# Patient Record
Sex: Female | Born: 1960
Health system: Southern US, Community
[De-identification: ages and names within clinical notes are randomized; demographics above are authoritative.]

## PROBLEM LIST (undated history)

## (undated) DIAGNOSIS — F419 Anxiety disorder, unspecified: Secondary | ICD-10-CM

## (undated) DIAGNOSIS — T7840XA Allergy, unspecified, initial encounter: Secondary | ICD-10-CM

## (undated) DIAGNOSIS — K2940 Chronic atrophic gastritis without bleeding: Secondary | ICD-10-CM

## (undated) DIAGNOSIS — E039 Hypothyroidism, unspecified: Secondary | ICD-10-CM

## (undated) DIAGNOSIS — K317 Polyp of stomach and duodenum: Secondary | ICD-10-CM

## (undated) DIAGNOSIS — E559 Vitamin D deficiency, unspecified: Secondary | ICD-10-CM

## (undated) DIAGNOSIS — E538 Deficiency of other specified B group vitamins: Secondary | ICD-10-CM

## (undated) DIAGNOSIS — D3A8 Other benign neuroendocrine tumors: Secondary | ICD-10-CM

## (undated) DIAGNOSIS — R7302 Impaired glucose tolerance (oral): Secondary | ICD-10-CM

## (undated) DIAGNOSIS — C801 Malignant (primary) neoplasm, unspecified: Secondary | ICD-10-CM

## (undated) DIAGNOSIS — I82409 Acute embolism and thrombosis of unspecified deep veins of unspecified lower extremity: Secondary | ICD-10-CM

## (undated) DIAGNOSIS — Z9889 Other specified postprocedural states: Secondary | ICD-10-CM

## (undated) DIAGNOSIS — R112 Nausea with vomiting, unspecified: Secondary | ICD-10-CM

## (undated) DIAGNOSIS — K2270 Barrett's esophagus without dysplasia: Secondary | ICD-10-CM

## (undated) DIAGNOSIS — J45909 Unspecified asthma, uncomplicated: Secondary | ICD-10-CM

## (undated) DIAGNOSIS — E785 Hyperlipidemia, unspecified: Secondary | ICD-10-CM

## (undated) HISTORY — PX: ESOPHAGOGASTRODUODENOSCOPY: SHX1529

## (undated) HISTORY — PX: DIAGNOSTIC LAPAROSCOPY: SUR761

## (undated) HISTORY — DX: Hypothyroidism, unspecified: E03.9

## (undated) HISTORY — PX: APPENDECTOMY: SHX54

## (undated) HISTORY — DX: Malignant (primary) neoplasm, unspecified: C80.1

## (undated) HISTORY — DX: Deficiency of other specified B group vitamins: E53.8

## (undated) HISTORY — PX: ABDOMINAL HYSTERECTOMY: SHX81

## (undated) HISTORY — DX: Anxiety disorder, unspecified: F41.9

## (undated) HISTORY — PX: TONSILLECTOMY: SUR1361

## (undated) HISTORY — PX: VEIN LIGATION AND STRIPPING: SHX2653

## (undated) HISTORY — DX: Other benign neuroendocrine tumors: D3A.8

## (undated) HISTORY — DX: Allergy, unspecified, initial encounter: T78.40XA

---

## 2010-11-27 HISTORY — PX: TOTAL ABDOMINAL HYSTERECTOMY W/ BILATERAL SALPINGOOPHORECTOMY: SHX83

## 2012-03-26 DIAGNOSIS — Z85020 Personal history of malignant carcinoid tumor of stomach: Secondary | ICD-10-CM | POA: Insufficient documentation

## 2013-05-16 ENCOUNTER — Ambulatory Visit: Payer: Self-pay | Admitting: Unknown Physician Specialty

## 2015-03-24 ENCOUNTER — Ambulatory Visit
Admit: 2015-03-24 | Disposition: A | Discharge status: Home / Self Care | Payer: Self-pay | Attending: Family Medicine | Admitting: Family Medicine

## 2015-05-05 DIAGNOSIS — E039 Hypothyroidism, unspecified: Secondary | ICD-10-CM | POA: Insufficient documentation

## 2015-05-05 DIAGNOSIS — D3A00 Benign carcinoid tumor of unspecified site: Secondary | ICD-10-CM | POA: Insufficient documentation

## 2015-05-05 DIAGNOSIS — E785 Hyperlipidemia, unspecified: Secondary | ICD-10-CM | POA: Insufficient documentation

## 2015-06-28 ENCOUNTER — Telehealth: Payer: Self-pay | Admitting: Family Medicine

## 2015-06-28 NOTE — Telephone Encounter (Signed)
Contacted this patient to relay the information listed below. Patient stated she wanted those items checked again because she has been having lots of pressure in her throat. Patient was transferred up front for an appt. Patient stated that she had her PAP & mammo performed at [place].

## 2015-06-28 NOTE — Telephone Encounter (Signed)
Requesting a lab order to check thyroid, b-12, and cholesterol. [phone]

## 2015-06-28 NOTE — Telephone Encounter (Signed)
Please explain to patient that she has been tested for these lab work once or twice this year already and is not due for lab work unless there are changes to her symptoms in which case she needs to follow up in clinic. Also she has never seen me for a physical who is doing her female PAP tests and mammogram orders?

## 2015-07-09 ENCOUNTER — Encounter: Payer: Self-pay | Admitting: Family Medicine

## 2015-07-09 ENCOUNTER — Ambulatory Visit (INDEPENDENT_AMBULATORY_CARE_PROVIDER_SITE_OTHER): Payer: 59 | Admitting: Family Medicine

## 2015-07-09 VITALS — BP 114/68 | HR 74 | Temp 98.3°F | Resp 16 | Ht 65.0 in | Wt 170.3 lb

## 2015-07-09 DIAGNOSIS — E039 Hypothyroidism, unspecified: Secondary | ICD-10-CM

## 2015-07-09 DIAGNOSIS — J309 Allergic rhinitis, unspecified: Secondary | ICD-10-CM

## 2015-07-09 DIAGNOSIS — R0982 Postnasal drip: Secondary | ICD-10-CM

## 2015-07-09 DIAGNOSIS — L750 Bromhidrosis: Secondary | ICD-10-CM

## 2015-07-09 DIAGNOSIS — F411 Generalized anxiety disorder: Secondary | ICD-10-CM | POA: Insufficient documentation

## 2015-07-09 DIAGNOSIS — L748 Other eccrine sweat disorders: Secondary | ICD-10-CM | POA: Diagnosis not present

## 2015-07-09 DIAGNOSIS — J45909 Unspecified asthma, uncomplicated: Secondary | ICD-10-CM

## 2015-07-09 NOTE — Progress Notes (Signed)
Name: Dana Hensley   MRN: [id number]    DOB: December 28, 1960   Date:07/09/2015       Progress Note  Subjective  Chief Complaint  Chief Complaint  Patient presents with  . Thyroid Problem    pt would like her TSH levels rechecked due to hair loss and neck discomfort.    HPI  Hypothyroidism: Patient presents for evaluation of thyroid function. Symptoms consist of fatigue, feeling cold and cold intolerance, anxiousness, change in skin,  nails, or hair, pressure on her neck when laying down. Symptoms have present for several months. The symptoms are mild.  The problem has been stable.  Previous thyroid studies include TSH, free thyroxine index, thyroid autoantibodies and free thyroxine. The hypothyroidism is due to hypothyroidism and Hashimoto's disease.  We did increase her levothyroxine dose some months ago and she felt her symptoms improve but now she has new symptoms. She notes the heaviness in her neck does not cause her to cough or choke on food or drink. She has no sore throat. Allergies are well controled recently with no nasal congestion or post nasal drip. Also she mentions she is embarrassed about body odor from her arm pits despite trying all types of OTC anti-per spirants and deodorants.     Active Ambulatory Problems    Diagnosis Date Noted  . History of malignant carcinoid tumor of stomach 03/26/2012  . Dyslipidemia 05/05/2015  . Acquired hypothyroidism 05/05/2015  . Body odor 07/09/2015  . GAD (generalized anxiety disorder) 07/09/2015  . Allergic rhinitis with postnasal drip 07/09/2015   Resolved Ambulatory Problems    Diagnosis Date Noted  . No Resolved Ambulatory Problems   Past Medical History  Diagnosis Date  . Anxiety   . Hypothyroidism   . B12 deficiency      Social History  Substance Use Topics  . Smoking status: Never Smoker   . Smokeless tobacco: Not on file  . Alcohol Use: No     Current outpatient prescriptions:  .  betamethasone dipropionate  (DIPROLENE) 0.05 % ointment, , Disp: , Rfl:  .  clobetasol ointment (TEMOVATE) 0.05 %, , Disp: , Rfl:  .  cyanocobalamin (,VITAMIN B-12,) 1000 MCG/ML injection, Inject into the muscle., Disp: , Rfl:  .  ELIDEL 1 % cream, , Disp: , Rfl:  .  hydrocortisone 2.5 % cream, , Disp: , Rfl:  .  levothyroxine (SYNTHROID, LEVOTHROID) 75 MCG tablet, Take 75 mcg by mouth daily before breakfast., Disp: , Rfl:  .  Multiple Vitamin (MULTI-VITAMINS) TABS, Take by mouth., Disp: , Rfl:  .  QNASL 80 MCG/ACT AERS, , Disp: , Rfl:  .  vitamin B-12 (CYANOCOBALAMIN) 1000 MCG tablet, Take by mouth., Disp: , Rfl:   Past Surgical History  Procedure Laterality Date  . Tonsillectomy    . Appendectomy    . Abdominal hysterectomy      Family History  Problem Relation Age of Onset  . Clotting disorder Mother     Allergies  Allergen Reactions  . Latex Swelling    SWELLING AROUND FACE SWELLING AROUND FACE  . Neomycin Other (See Comments) and Rash    Seen from allergy skin test Seen from allergy skin test  . Neosporin  [Neomycin-Bacitracin Zn-Polymyx] Other (See Comments)    Seen from allergy skin test  . Neomycin-Polymyxin-Gramicidin     Other reaction(s): Unknown Seen from allergy skin test  . Other Other (See Comments)    THIMERSOL. THIMERSOL- seen from allergy skin test Other reaction(s): Unknown THIMERSOL. THIMERSOL-  seen from allergy skin test     Review of Systems  CONSTITUTIONAL: No significant weight changes, fever, chills, weakness or fatigue.  HEENT:  - Eyes: No visual changes.  - Ears: No auditory changes. No pain.  - Nose: No sneezing, congestion, runny nose. - Throat: No sore throat. No changes in swallowing. SKIN: No rash or itching.  CARDIOVASCULAR: No chest pain, chest pressure or chest discomfort. No palpitations or edema.  RESPIRATORY: No shortness of breath, cough or sputum.  GASTROINTESTINAL: No anorexia, nausea, vomiting. No changes in bowel habits. No abdominal pain or blood.   GENITOURINARY: No dysuria. No frequency. No discharge.  NEUROLOGICAL: No headache, dizziness, syncope, paralysis, ataxia, numbness or tingling in the extremities. No memory changes. No change in bowel or bladder control.  MUSCULOSKELETAL: No joint pain. No muscle pain. HEMATOLOGIC: No anemia, bleeding or bruising.  LYMPHATICS: No enlarged lymph nodes.  PSYCHIATRIC: No change in mood. No change in sleep pattern.  ENDOCRINOLOGIC: Yes reports of sweating, cold intolerance. No polyuria or polydipsia.     Objective  BP 114/68 mmHg  Pulse 74  Temp(Src) 98.3 F (36.8 C)  Resp 16  Ht 5\' 5"  (1.651 m)  Wt 170 lb 5 oz (77.253 kg)  BMI 28.34 kg/m2  SpO2 97% Body mass index is 28.34 kg/(m^2).  Physical Exam  Constitutional: Patient appears well-developed and well-nourished. In no distress.  HEENT:  - Head: Normocephalic and atraumatic.  - Ears: Bilateral TMs gray, no erythema or effusion - Nose: Nasal mucosa moist - Mouth/Throat: Oropharynx is clear and moist. No tonsillar hypertrophy or erythema. No post nasal drainage.  - Eyes: Conjunctivae clear, EOM movements normal. PERRLA. No scleral icterus.  Neck: Normal range of motion. Neck supple. No JVD present. No thyromegaly present.  Cardiovascular: Normal rate, regular rhythm and normal heart sounds.  No murmur heard.  Pulmonary/Chest: Effort normal and breath sounds normal. No respiratory distress. Musculoskeletal: Normal range of motion bilateral UE and LE, no joint effusions. Peripheral vascular: Bilateral LE no edema. Neurological: CN II-XII grossly intact with no focal deficits. Alert and oriented to person, place, and time. Coordination, balance, strength, speech and gait are normal.  Skin: Skin is warm and dry. No rash noted. No erythema.  Psychiatric: Patient has a normal mood and affect. Behavior is normal in office today. Judgment and thought content normal in office today.   Assessment & Plan  1. Acquired hypothyroidism No  goiter or thyroid masses found on exam. Will recheck lab work and adjust thyroid medication if needed.  - TSH - T3, free - T4, free  2. Body odor Not sure if there is anything prescription strength that could help with odor.  3. Allergic rhinitis with postnasal drip Sensation in throat may be due to allergies and post nasal drainage but she will follow up with ENT if symptoms persist or worsen.

## 2015-07-10 LAB — T4, FREE: Free T4: 1.32 ng/dL (ref 0.82–1.77)

## 2015-07-10 LAB — T3, FREE: T3, Free: 2.9 pg/mL (ref 2.0–4.4)

## 2015-07-10 LAB — TSH: TSH: 1.06 u[IU]/mL (ref 0.450–4.500)

## 2015-07-12 ENCOUNTER — Telehealth: Payer: Self-pay | Admitting: Family Medicine

## 2015-07-12 NOTE — Telephone Encounter (Signed)
Patient returning your call.

## 2015-07-12 NOTE — Telephone Encounter (Signed)
Contacted this patient to inform her of the results from her Thyroid testing. Patient was also informed that the concerns she had regarding body odor could be diet related and that she could search the Internet to see what she can find.

## 2015-08-19 ENCOUNTER — Other Ambulatory Visit: Payer: Self-pay | Admitting: Family Medicine

## 2015-08-19 ENCOUNTER — Telehealth: Payer: Self-pay | Admitting: Family Medicine

## 2015-08-19 DIAGNOSIS — M549 Dorsalgia, unspecified: Principal | ICD-10-CM

## 2015-08-19 DIAGNOSIS — G8929 Other chronic pain: Principal | ICD-10-CM

## 2015-08-19 DIAGNOSIS — M542 Cervicalgia: Secondary | ICD-10-CM | POA: Insufficient documentation

## 2015-08-19 MED ORDER — CYCLOBENZAPRINE HCL 5 MG PO TABS
5.0000 mg | ORAL_TABLET | Freq: Two times a day (BID) | ORAL | Status: DC | PRN
Start: 2015-08-19 — End: 2016-10-11

## 2015-08-19 NOTE — Telephone Encounter (Signed)
Both done 

## 2015-08-19 NOTE — Telephone Encounter (Signed)
Patient is requesting a referral to be sent to Dr. Milagros Reap (he is a Restaurant manager, [employer]). Please call them at [phone]. Due to ongoing neck and back pain. She has seen him in the past. Also Requesting a refill cyclobenzaprine 5mg . Please send to [email or web address]. Completely out, last seen in August.

## 2015-08-19 NOTE — Telephone Encounter (Signed)
Refill request was sent to Dr. Ashany Sundaram for approval and submission.  

## 2015-09-06 ENCOUNTER — Other Ambulatory Visit: Payer: Self-pay | Admitting: Family Medicine

## 2015-10-18 ENCOUNTER — Encounter: Payer: Self-pay | Admitting: Family Medicine

## 2015-10-18 ENCOUNTER — Ambulatory Visit (INDEPENDENT_AMBULATORY_CARE_PROVIDER_SITE_OTHER): Payer: 59 | Admitting: Family Medicine

## 2015-10-18 VITALS — BP 122/82 | HR 74 | Temp 98.5°F | Resp 16 | Wt 164.8 lb

## 2015-10-18 DIAGNOSIS — E785 Hyperlipidemia, unspecified: Secondary | ICD-10-CM | POA: Diagnosis not present

## 2015-10-18 DIAGNOSIS — J309 Allergic rhinitis, unspecified: Secondary | ICD-10-CM

## 2015-10-18 DIAGNOSIS — E538 Deficiency of other specified B group vitamins: Secondary | ICD-10-CM | POA: Diagnosis not present

## 2015-10-18 DIAGNOSIS — E039 Hypothyroidism, unspecified: Secondary | ICD-10-CM | POA: Diagnosis not present

## 2015-10-18 DIAGNOSIS — R0982 Postnasal drip: Secondary | ICD-10-CM | POA: Diagnosis not present

## 2015-10-18 DIAGNOSIS — J45909 Unspecified asthma, uncomplicated: Secondary | ICD-10-CM | POA: Diagnosis not present

## 2015-10-18 DIAGNOSIS — J029 Acute pharyngitis, unspecified: Secondary | ICD-10-CM | POA: Insufficient documentation

## 2015-10-18 DIAGNOSIS — J069 Acute upper respiratory infection, unspecified: Secondary | ICD-10-CM | POA: Diagnosis not present

## 2015-10-18 NOTE — Patient Instructions (Signed)
Switch from Claritin or Zyrtec to Allegra 180 mg one a day.

## 2015-10-18 NOTE — Addendum Note (Signed)
Addended by: Bobetta Lime on: 10/18/2015 10:02 AM   Modules accepted: Orders, Level of Service

## 2015-10-18 NOTE — Progress Notes (Addendum)
Name: Dana Hensley   MRN: [id number]    DOB: 11/20/1961   Date:10/18/2015       Progress Note  Subjective  Chief Complaint  Chief Complaint  Patient presents with  . Allergies    patient presents with throat/ neck pain, with runny nose and sneezing. otc loratadine.    HPI  Patient is here today with concerns regarding the following symptoms sore throat, congestion, post nasal drip, sneezing, ear pressure, sinus pressure, headaches and achiness that started between May and July, but the recent flare up has been a couple of days ago.  Associated with chills and fatigue. Has tried the following home remedies: Loratadine and NyQuil. Has seen ENT once earlier this year and it was suggested that she may benefit from allergy shots however she was unable to follow up due to insurance and referral issues. She is currently on antibiotics for UTI from her gynecologist, Macrobid.  Requesting blood work for her chronic medical conditions such as hypothyroidism, b12 deficiency, hld. Taking medications with no problems reported.  Past Medical History  Diagnosis Date  . Anxiety   . Hypothyroidism   . B12 deficiency   . Allergy     Social History  Substance Use Topics  . Smoking status: Never Smoker   . Smokeless tobacco: Not on file  . Alcohol Use: No     Current outpatient prescriptions:  .  betamethasone dipropionate (DIPROLENE) 0.05 % ointment, , Disp: , Rfl:  .  clobetasol ointment (TEMOVATE) 0.05 %, , Disp: , Rfl:  .  cyanocobalamin (,VITAMIN B-12,) 1000 MCG/ML injection, Inject into the muscle., Disp: , Rfl:  .  cyclobenzaprine (FLEXERIL) 5 MG tablet, Take 1 tablet (5 mg total) by mouth 2 (two) times daily as needed for muscle spasms., Disp: 40 tablet, Rfl: 3 .  ELIDEL 1 % cream, , Disp: , Rfl:  .  hydrocortisone 2.5 % cream, , Disp: , Rfl:  .  levothyroxine (SYNTHROID, LEVOTHROID) 75 MCG tablet, TAKE ONE TABLET BY MOUTH ONCE DAILY, Disp: 90 tablet, Rfl: 1 .  Multiple Vitamin  (MULTI-VITAMINS) TABS, Take by mouth., Disp: , Rfl:  .  QNASL 80 MCG/ACT AERS, , Disp: , Rfl:  .  vitamin B-12 (CYANOCOBALAMIN) 1000 MCG tablet, Take by mouth., Disp: , Rfl:   Allergies  Allergen Reactions  . Latex Swelling    SWELLING AROUND FACE SWELLING AROUND FACE  . Neomycin Other (See Comments) and Rash    Seen from allergy skin test Seen from allergy skin test  . Neosporin  [Neomycin-Bacitracin Zn-Polymyx] Other (See Comments)    Seen from allergy skin test  . Neomycin-Polymyxin-Gramicidin     Other reaction(s): Unknown Seen from allergy skin test  . Other Other (See Comments)    THIMERSOL. THIMERSOL- seen from allergy skin test Other reaction(s): Unknown THIMERSOL. THIMERSOL- seen from allergy skin test    ROS  CONSTITUTIONAL: No significant weight changes, fever, chills, weakness. Yes fatigue.  HEENT:  - Eyes: No visual changes.  - Ears: No auditory changes. Yes fullness.  - Nose: Yes sneezing, congestion, runny nose. - Throat: Yes sore throat. No changes in swallowing. SKIN: No rash or itching.  CARDIOVASCULAR: No chest pain, chest pressure or chest discomfort. No palpitations or edema.  RESPIRATORY: No shortness of breath, cough or sputum.  LYMPHATICS: No enlarged lymph nodes.  PSYCHIATRIC: No change in mood. No change in sleep pattern.  ENDOCRINOLOGIC: No reports of sweating, cold or heat intolerance. No polyuria or polydipsia.  Objective  Filed Vitals:   10/18/15 0929  BP: 122/82  Pulse: 74  Temp: 98.5 F (36.9 C)  TempSrc: Oral  Resp: 16  Weight: 164 lb 12.8 oz (74.753 kg)  SpO2: 96%   Body mass index is 27.42 kg/(m^2).   Physical Exam  Constitutional: Patient appears well-developed and well-nourished. In no acute distress. HEENT:  - Head: Normocephalic and atraumatic.  - Ears: RIGHT and LEFT TM pearly gray with no effusion or retractions or erythema. - Nose: Nasal mucosa boggy and congested.  - Mouth/Throat: Oropharynx is moist with no  erythema of bilateral tonsils without hypertrophy or exudates. Post nasal drainage present.  - Eyes: Conjunctivae clear, EOM movements normal. PERRLA. No scleral icterus.  Neck: Normal range of motion. Neck supple. No JVD present. No thyromegaly present. No local lymphadenopathy. Cardiovascular: Regular rate, regular rhythm with no murmurs heard.  Pulmonary/Chest: Effort normal and breath sounds clear in all lung fields.  Musculoskeletal: Normal range of motion bilateral UE and LE, no joint effusions. Skin: Skin is warm, dry lips.  Psychiatric: Patient has a stable mood and affect. Behavior is normal in office today. Judgment and thought content normal in office today.   Assessment & Plan  1. URI (upper respiratory infection) Etiology likely viral URI with poorly controlled allergies. Instructed patient on increasing hydration, nasal saline spray, steam inhalation, NSAID if tolerated and not contraindicated. Toggle anti-histamine. If symptoms persist/worsen may consider antibiotic therapy. Offered prednisone taper pack patient declined.   2. Allergic rhinitis with postnasal drip Refer patient back to [facility] for continued allergy symptoms with post nasal drainage despite taking antihistamine and flonase. She has seen them once.   - Ambulatory referral to ENT  3. Dyslipidemia  - Lipid panel  4. Acquired hypothyroidism  - CBC with Differential/Platelet - Comprehensive metabolic panel - TSH - T3, free - T4, free  5. Vitamin B12 deficiency  - CBC with Differential/Platelet - Comprehensive metabolic panel - [id number]

## 2015-10-27 LAB — COMPREHENSIVE METABOLIC PANEL
A/G RATIO: 1.4 (ref 1.1–2.5)
ALBUMIN: 4 g/dL (ref 3.5–5.5)
ALT: 9 IU/L (ref 0–32)
AST: 12 IU/L (ref 0–40)
Alkaline Phosphatase: 69 IU/L (ref 39–117)
BILIRUBIN TOTAL: 0.3 mg/dL (ref 0.0–1.2)
BUN/Creatinine Ratio: 22 (ref 9–23)
BUN: 14 mg/dL (ref 6–24)
CHLORIDE: 103 mmol/L (ref 97–106)
CO2: 26 mmol/L (ref 18–29)
Calcium: 9.5 mg/dL (ref 8.7–10.2)
Creatinine, Ser: 0.65 mg/dL (ref 0.57–1.00)
GFR calc non Af Amer: 102 mL/min/{1.73_m2} (ref >59)
GFR, EST AFRICAN AMERICAN: 117 mL/min/{1.73_m2} (ref >59)
Globulin, Total: 2.8 g/dL (ref 1.5–4.5)
Glucose: 91 mg/dL (ref 65–99)
POTASSIUM: 4.7 mmol/L (ref 3.5–5.2)
Sodium: 142 mmol/L (ref 136–144)
Total Protein: 6.8 g/dL (ref 6.0–8.5)

## 2015-10-27 LAB — TSH: TSH: 1.8 u[IU]/mL (ref 0.450–4.500)

## 2015-10-27 LAB — CBC WITH DIFFERENTIAL/PLATELET
BASOS: 0 %
Basophils Absolute: 0 10*3/uL (ref 0.0–0.2)
EOS (ABSOLUTE): 0.2 10*3/uL (ref 0.0–0.4)
Eos: 3 %
Hematocrit: 37.1 % (ref 34.0–46.6)
Hemoglobin: 12.3 g/dL (ref 11.1–15.9)
IMMATURE GRANS (ABS): 0 10*3/uL (ref 0.0–0.1)
Immature Granulocytes: 0 %
LYMPHS: 34 %
Lymphocytes Absolute: 2.6 10*3/uL (ref 0.7–3.1)
MCH: 28.7 pg (ref 26.6–33.0)
MCHC: 33.2 g/dL (ref 31.5–35.7)
MCV: 87 fL (ref 79–97)
Monocytes Absolute: 0.4 10*3/uL (ref 0.1–0.9)
Monocytes: 5 %
NEUTROS ABS: 4.4 10*3/uL (ref 1.4–7.0)
Neutrophils: 58 %
PLATELETS: 261 10*3/uL (ref 150–379)
RBC: 4.28 x10E6/uL (ref 3.77–5.28)
RDW: 14.3 % (ref 12.3–15.4)
WBC: 7.6 10*3/uL (ref 3.4–10.8)

## 2015-10-27 LAB — T3, FREE: T3, Free: 2.7 pg/mL (ref 2.0–4.4)

## 2015-10-27 LAB — LIPID PANEL
Chol/HDL Ratio: 5.1 ratio units — ABNORMAL HIGH (ref 0.0–4.4)
Cholesterol, Total: 204 mg/dL — ABNORMAL HIGH (ref 100–199)
HDL: 40 mg/dL (ref >39)
LDL Calculated: 141 mg/dL — ABNORMAL HIGH (ref 0–99)
Triglycerides: 117 mg/dL (ref 0–149)
VLDL Cholesterol Cal: 23 mg/dL (ref 5–40)

## 2015-10-27 LAB — VITAMIN B12: VITAMIN B 12: 642 pg/mL (ref 211–946)

## 2015-10-27 LAB — T4, FREE: FREE T4: 1.32 ng/dL (ref 0.82–1.77)

## 2015-11-26 ENCOUNTER — Ambulatory Visit (INDEPENDENT_AMBULATORY_CARE_PROVIDER_SITE_OTHER): Payer: 59 | Admitting: Family Medicine

## 2015-11-26 ENCOUNTER — Encounter: Payer: Self-pay | Admitting: Family Medicine

## 2015-11-26 VITALS — BP 132/78 | HR 91 | Temp 98.2°F | Resp 16 | Wt 165.0 lb

## 2015-11-26 DIAGNOSIS — R0982 Postnasal drip: Secondary | ICD-10-CM | POA: Diagnosis not present

## 2015-11-26 DIAGNOSIS — J0100 Acute maxillary sinusitis, unspecified: Secondary | ICD-10-CM | POA: Diagnosis not present

## 2015-11-26 DIAGNOSIS — R300 Dysuria: Secondary | ICD-10-CM

## 2015-11-26 DIAGNOSIS — J45909 Unspecified asthma, uncomplicated: Secondary | ICD-10-CM

## 2015-11-26 DIAGNOSIS — J309 Allergic rhinitis, unspecified: Secondary | ICD-10-CM

## 2015-11-26 LAB — POCT URINALYSIS DIPSTICK
BILIRUBIN UA: NEGATIVE
Blood, UA: NEGATIVE
Glucose, UA: NEGATIVE
Ketones, UA: NEGATIVE
Leukocytes, UA: NEGATIVE
Nitrite, UA: NEGATIVE
PH UA: 5
PROTEIN UA: NEGATIVE
Spec Grav, UA: 1.015
Urobilinogen, UA: 0.2

## 2015-11-26 MED ORDER — AMOXICILLIN-POT CLAVULANATE 875-125 MG PO TABS
1.0000 | ORAL_TABLET | Freq: Two times a day (BID) | ORAL | Status: DC
Start: 2015-11-26 — End: 2016-03-15

## 2015-11-26 MED ORDER — PREDNISONE 10 MG (21) PO TBPK: ORAL_TABLET | ORAL | Status: DC

## 2015-11-26 NOTE — Patient Instructions (Signed)

## 2015-11-26 NOTE — Progress Notes (Signed)
Name: Dana Hensley   MRN: [id number]    DOB: June 10, 1961   Date:11/26/2015       Progress Note  Subjective  Chief Complaint  Chief Complaint  Patient presents with  . Sinusitis    lots of sickness in the house. patient stated that she has been sick for 5-6 days.     HPI  Patient is here today with concerns regarding the following symptoms sore throat (at first), congestion, post nasal drip, coryza, sinus pressure, non productive cough, productive cough and achiness that started about a week ago.  Patient stated that she has blowing out thick greenish nasal mucus and is coughing up green chunks. Associated with chills, sweats, fatigue and malaise. Has tried the following home remedies: NyQuil, DayQuil and Ibuprofen. Patient has tries to hydrate and get plenty of rest.  She also mentions that for a few months now has vague intermittent pelvic discomfort and dysuria but not frequency or blood in her urine or fevers or flank pain or vaginal discharge. Saw her gynecologist and pelvic work up negative. Urine did have blood at the time and she was treated for UTI but she never heard back about culture results. She is reporting reoccurrence of symptoms today.  Past Medical History  Diagnosis Date  . Anxiety   . Hypothyroidism   . B12 deficiency   . Allergy     Social History  Substance Use Topics  . Smoking status: Never Smoker   . Smokeless tobacco: Not on file  . Alcohol Use: No     Current outpatient prescriptions:  .  betamethasone dipropionate (DIPROLENE) 0.05 % ointment, , Disp: , Rfl:  .  clobetasol ointment (TEMOVATE) 0.05 %, , Disp: , Rfl:  .  cyclobenzaprine (FLEXERIL) 5 MG tablet, Take 1 tablet (5 mg total) by mouth 2 (two) times daily as needed for muscle spasms., Disp: 40 tablet, Rfl: 3 .  ELIDEL 1 % cream, , Disp: , Rfl:  .  fluticasone (FLONASE) 50 MCG/ACT nasal spray, , Disp: , Rfl:  .  hydrocortisone 2.5 % cream, , Disp: , Rfl:  .  levothyroxine (SYNTHROID,  LEVOTHROID) 75 MCG tablet, TAKE ONE TABLET BY MOUTH ONCE DAILY, Disp: 90 tablet, Rfl: 1 .  Multiple Vitamin (MULTI-VITAMINS) TABS, Take by mouth., Disp: , Rfl:  .  QNASL 80 MCG/ACT AERS, , Disp: , Rfl:  .  vitamin B-12 (CYANOCOBALAMIN) 1000 MCG tablet, Take by mouth., Disp: , Rfl:   Allergies  Allergen Reactions  . Latex Swelling    SWELLING AROUND FACE SWELLING AROUND FACE  . Neomycin Other (See Comments) and Rash    Seen from allergy skin test Seen from allergy skin test  . Neosporin  [Neomycin-Bacitracin Zn-Polymyx] Other (See Comments)    Seen from allergy skin test  . Neomycin-Polymyxin-Gramicidin     Other reaction(s): Unknown Seen from allergy skin test  . Other Other (See Comments)    THIMERSOL. THIMERSOL- seen from allergy skin test Other reaction(s): Unknown THIMERSOL. THIMERSOL- seen from allergy skin test    ROS  CONSTITUTIONAL: No significant weight changes, fever, chills, weakness or fatigue.  HEENT:  - Eyes: No visual changes.  - Ears: No auditory changes. No pain.  - Nose: Yes congestion, sinus pressure. - Throat: Mild sore throat. No changes in swallowing. SKIN: No rash or itching.  CARDIOVASCULAR: No chest pain, chest pressure or chest discomfort. No palpitations or edema.  RESPIRATORY: No shortness of breath. Mild cough. GASTROINTESTINAL: No anorexia, nausea, vomiting. No changes in  bowel habits. No abdominal pain or blood.  GENITOURINARY: Yes dysuria. No frequency. No discharge.  LYMPHATICS: No enlarged lymph nodes.      Objective  Filed Vitals:   11/26/15 1006  BP: 132/78  Pulse: 91  Temp: 98.2 F (36.8 C)  TempSrc: Oral  Resp: 16  Weight: 165 lb (74.844 kg)  SpO2: 98%   Body mass index is 27.46 kg/(m^2).   Physical Exam  Constitutional: Patient appears well-developed and well-nourished. In no acute distress but does appear to be fatigued from acute illness. HEENT:  - Head: Normocephalic and atraumatic.  - Ears: RIGHT TM bulging with  minimal clear exudate, LEFT TM bulging with minimal clear exudate.  - Nose: Nasal mucosa boggy and congested.  - Mouth/Throat: Oropharynx is moist with slight erythema of bilateral tonsils without hypertrophy or exudates. Post nasal drainage present.  - Eyes: Conjunctivae clear, EOM movements normal. PERRLA. No scleral icterus.  Neck: Normal range of motion. Neck supple. No JVD present. No thyromegaly present. No local lymphadenopathy. Cardiovascular: Regular rate, regular rhythm with no murmurs heard.  Pulmonary/Chest: Effort normal and breath sounds clear in all lung fields.  Abdomen: Soft, patient reports tenderness right LQ and suprapubic area. No guarding or rebound. Musculoskeletal: Normal range of motion bilateral UE and LE, no joint effusions. Skin: Skin is warm and dry. No rash noted. Psychiatric: Patient has a normal mood and affect. Behavior is normal in office today. Judgment and thought content normal in office today.  Results for orders placed or performed in visit on 11/26/15 (from the past 24 hour(s))  POCT Urinalysis Dipstick     Status: Normal   Collection Time: 11/26/15 10:48 AM  Result Value Ref Range   Color, UA Yellow    Clarity, UA Clear    Glucose, UA Negative    Bilirubin, UA Negative    Ketones, UA Negative    Spec Grav, UA 1.015    Blood, UA Negative    pH, UA 5.0    Protein, UA Negative    Urobilinogen, UA 0.2    Nitrite, UA Negative    Leukocytes, UA Negative Negative     Assessment & Plan  1. Acute maxillary sinusitis, recurrence not specified Etiologies include initial allergic rhinitis or viral infection progressing to superimposed bacterial infection. Instructed patient on increasing hydration, nasal saline spray, steam inhalation, NSAID if tolerated and not contraindicated. If not already doing so start taking daily anti-histamine and use a steroid nasal spray. If symptoms persist/worsen may consider antibiotic therapy.  - amoxicillin-clavulanate  (AUGMENTIN) 875-125 MG tablet; Take 1 tablet by mouth 2 (two) times daily.  Dispense: 20 tablet; Refill: 0 - predniSONE (STERAPRED UNI-PAK 21 TAB) 10 MG (21) TBPK tablet; Use as directed in a 6 day taper PredPak  Dispense: 21 tablet; Refill: 0  2. Allergic rhinitis with postnasal drip She did F/U with Allergist and is to start allergy shots early 2017.  - amoxicillin-clavulanate (AUGMENTIN) 875-125 MG tablet; Take 1 tablet by mouth 2 (two) times daily.  Dispense: 20 tablet; Refill: 0 - predniSONE (STERAPRED UNI-PAK 21 TAB) 10 MG (21) TBPK tablet; Use as directed in a 6 day taper PredPak  Dispense: 21 tablet; Refill: 0  3. Dysuria Udip unremarkable. Ucx pending. May need to see Urologist.  - POCT Urinalysis Dipstick - Urine Culture

## 2015-11-27 LAB — URINE CULTURE: Organism ID, Bacteria: NO GROWTH

## 2015-11-27 LAB — PLEASE NOTE

## 2016-01-25 ENCOUNTER — Encounter: Payer: Self-pay | Admitting: Family Medicine

## 2016-01-25 DIAGNOSIS — K2270 Barrett's esophagus without dysplasia: Secondary | ICD-10-CM | POA: Insufficient documentation

## 2016-03-14 ENCOUNTER — Other Ambulatory Visit: Payer: Self-pay

## 2016-03-15 MED ORDER — LEVOTHYROXINE SODIUM 75 MCG PO TABS
75.0000 ug | ORAL_TABLET | Freq: Every day | ORAL | Status: DC
Start: 2016-03-15 — End: 2023-08-08

## 2016-03-15 NOTE — Telephone Encounter (Signed)
Last TSH, T3, T4 from Nov 2016 reviewed; normal; Rx approved

## 2016-09-13 ENCOUNTER — Encounter (INDEPENDENT_AMBULATORY_CARE_PROVIDER_SITE_OTHER): Payer: Self-pay

## 2016-09-25 ENCOUNTER — Ambulatory Visit (INDEPENDENT_AMBULATORY_CARE_PROVIDER_SITE_OTHER): Payer: Self-pay | Admitting: Vascular Surgery

## 2016-10-03 ENCOUNTER — Other Ambulatory Visit: Payer: Self-pay | Admitting: Nurse Practitioner

## 2016-10-03 DIAGNOSIS — D3A092 Benign carcinoid tumor of the stomach: Secondary | ICD-10-CM

## 2016-10-03 DIAGNOSIS — R1013 Epigastric pain: Secondary | ICD-10-CM | POA: Insufficient documentation

## 2016-10-03 DIAGNOSIS — R1011 Right upper quadrant pain: Secondary | ICD-10-CM

## 2016-10-09 ENCOUNTER — Ambulatory Visit
Admission: RE | Admit: 2016-10-09 | Discharge: 2016-10-09 | Disposition: A | Discharge status: Home / Self Care | Payer: 59 | Source: Ambulatory Visit | Attending: Nurse Practitioner | Admitting: Nurse Practitioner

## 2016-10-09 ENCOUNTER — Other Ambulatory Visit (INDEPENDENT_AMBULATORY_CARE_PROVIDER_SITE_OTHER): Payer: Self-pay | Admitting: Vascular Surgery

## 2016-10-09 DIAGNOSIS — K8020 Calculus of gallbladder without cholecystitis without obstruction: Secondary | ICD-10-CM | POA: Diagnosis not present

## 2016-10-09 DIAGNOSIS — R1011 Right upper quadrant pain: Secondary | ICD-10-CM | POA: Diagnosis not present

## 2016-10-09 DIAGNOSIS — D3A092 Benign carcinoid tumor of the stomach: Secondary | ICD-10-CM | POA: Insufficient documentation

## 2016-10-09 DIAGNOSIS — M7989 Other specified soft tissue disorders: Secondary | ICD-10-CM

## 2016-10-09 DIAGNOSIS — I83813 Varicose veins of bilateral lower extremities with pain: Secondary | ICD-10-CM

## 2016-10-09 DIAGNOSIS — M79661 Pain in right lower leg: Secondary | ICD-10-CM

## 2016-10-09 DIAGNOSIS — M79662 Pain in left lower leg: Secondary | ICD-10-CM

## 2016-10-11 ENCOUNTER — Ambulatory Visit (INDEPENDENT_AMBULATORY_CARE_PROVIDER_SITE_OTHER): Payer: 59 | Admitting: Vascular Surgery

## 2016-10-11 ENCOUNTER — Encounter (INDEPENDENT_AMBULATORY_CARE_PROVIDER_SITE_OTHER): Payer: Self-pay | Admitting: Vascular Surgery

## 2016-10-11 ENCOUNTER — Ambulatory Visit (INDEPENDENT_AMBULATORY_CARE_PROVIDER_SITE_OTHER): Payer: 59

## 2016-10-11 VITALS — BP 138/89 | HR 57 | Resp 16 | Ht 65.0 in | Wt 160.0 lb

## 2016-10-11 DIAGNOSIS — M79662 Pain in left lower leg: Secondary | ICD-10-CM | POA: Diagnosis not present

## 2016-10-11 DIAGNOSIS — M7989 Other specified soft tissue disorders: Secondary | ICD-10-CM | POA: Diagnosis not present

## 2016-10-11 DIAGNOSIS — M79661 Pain in right lower leg: Secondary | ICD-10-CM | POA: Diagnosis not present

## 2016-10-11 DIAGNOSIS — I83813 Varicose veins of bilateral lower extremities with pain: Secondary | ICD-10-CM

## 2016-10-11 DIAGNOSIS — I872 Venous insufficiency (chronic) (peripheral): Secondary | ICD-10-CM | POA: Diagnosis not present

## 2016-10-11 DIAGNOSIS — I890 Lymphedema, not elsewhere classified: Secondary | ICD-10-CM

## 2016-10-16 DIAGNOSIS — I83813 Varicose veins of bilateral lower extremities with pain: Secondary | ICD-10-CM | POA: Insufficient documentation

## 2016-10-16 DIAGNOSIS — I890 Lymphedema, not elsewhere classified: Secondary | ICD-10-CM | POA: Insufficient documentation

## 2016-10-16 DIAGNOSIS — I872 Venous insufficiency (chronic) (peripheral): Secondary | ICD-10-CM | POA: Insufficient documentation

## 2016-10-16 NOTE — Progress Notes (Signed)
Subjective:    Patient ID: Dana Hensley, female    DOB: 05-18-1961, 55 y.o.   MRN: [id number] Chief Complaint  Patient presents with  . Re-evaluation    Bilateral reflux ultrasound    Patient last seen on 06/12/16 for evaluation of bilateral symptomatic varicose veins. The patient relates burning and stinging which worsened steadily throughout the course of the day, particularly with standing. She states this is "just a bad feeling". The patient also notes an aching and throbbing pain over the varicosities, particularly with prolonged dependent positions. The patient also notes that during hot weather the symptoms are greatly intensified. Since her initial visit the patient has been wearing medical grade one compression stockings, elevating her legs and remaining active with minimal relief requiring the use of OTC anti-inflammatories. The patient underwent a bilateral venus duplex which was notable for RIGHT: Stripped GSV and SSV, no reflux, DVT or SVT / LEFT: Stripped proximal GSV with reflux in distal aspect of GSV, no DVT / SVT.  The patient states the pain from the varicose veins interferes with work, daily exercise, shopping and household maintenance. At this point, the symptoms are persistent and severe enough that they're having a negative impact on lifestyle and are interfering with daily activities.   Review of Systems  Constitutional: Negative.   HENT: Negative.   Eyes: Negative.   Respiratory: Negative.   Cardiovascular: Positive for leg swelling.       Bilateral Lower Extremity Pain  Gastrointestinal: Negative.   Endocrine: Negative.   Genitourinary: Negative.   Musculoskeletal: Negative.   Skin: Negative.   Allergic/Immunologic: Negative.   Neurological: Negative.   Hematological: Negative.   Psychiatric/Behavioral: Negative.       Objective:   Physical Exam  Constitutional: She is oriented to person, place, and time. She appears well-developed and well-nourished.    HENT:  Head: Normocephalic and atraumatic.  Eyes: Conjunctivae and EOM are normal. Pupils are equal, round, and reactive to light.  Neck: Normal range of motion.  Cardiovascular: Normal rate, regular rhythm, normal heart sounds and intact distal pulses.   Pulses:      Radial pulses are 2+ on the right side, and 2+ on the left side.       Dorsalis pedis pulses are 2+ on the right side, and 2+ on the left side.       Posterior tibial pulses are 2+ on the right side, and 2+ on the left side.  Venous Evaluation - Bilateral-Diffuse varicosities present, Varicosities measure 6-25mm and Mild stasis dermatitis. CEAP Classification Clinical Classification - Bilateral - C4:A - Pigmentation or eczema.  Pulmonary/Chest: Effort normal and breath sounds normal.  Abdominal: Soft. Bowel sounds are normal.  Musculoskeletal: Normal range of motion. She exhibits edema.  Neurological: She is alert and oriented to person, place, and time.  Skin: Skin is warm and dry.  Psychiatric: She has a normal mood and affect. Her behavior is normal. Judgment and thought content normal.   BP 138/89 (BP Location: Right Arm)   Pulse (!) 57   Resp 16   Ht 5\' 5"  (1.651 m)   Wt 160 lb (72.6 kg)   BMI 26.63 kg/m   Past Medical History:  Diagnosis Date  . Allergy   . Anxiety   . B12 deficiency   . Hypothyroidism     Social History   Social History  . Marital status: Married    Spouse name: N/A  . Number of children: N/A  . Years of  education: N/A   Occupational History  . Not on file.   Social History Main Topics  . Smoking status: Never Smoker  . Smokeless tobacco: Not on file  . Alcohol use No  . Drug use: No  . Sexual activity: Not on file   Other Topics Concern  . Not on file   Social History Narrative  . No narrative on file    Past Surgical History:  Procedure Laterality Date  . ABDOMINAL HYSTERECTOMY    . APPENDECTOMY    . TONSILLECTOMY      Family History  Problem Relation Age  of Onset  . Clotting disorder Mother   . Hyperlipidemia Father     Allergies  Allergen Reactions  . Latex Swelling    SWELLING AROUND FACE SWELLING AROUND FACE  . Neomycin Other (See Comments) and Rash    Seen from allergy skin test Seen from allergy skin test  . Neosporin  [Neomycin-Bacitracin Zn-Polymyx] Other (See Comments)    Seen from allergy skin test  . Neomycin-Polymyxin-Gramicidin     Other reaction(s): Unknown Seen from allergy skin test  . Other Other (See Comments)    THIMERSOL. THIMERSOL- seen from allergy skin test Other reaction(s): Unknown THIMERSOL. THIMERSOL- seen from allergy skin test       Assessment & Plan:  Patient last seen on 06/12/16 for evaluation of bilateral symptomatic varicose veins. The patient relates burning and stinging which worsened steadily throughout the course of the day, particularly with standing. She states this is "just a bad feeling". The patient also notes an aching and throbbing pain over the varicosities, particularly with prolonged dependent positions. The patient also notes that during hot weather the symptoms are greatly intensified. Since her initial visit the patient has been wearing medical grade one compression stockings, elevating her legs and remaining active with minimal relief requiring the use of OTC anti-inflammatories. The patient underwent a bilateral venus duplex which was notable for RIGHT: Stripped GSV and SSV, no reflux, DVT or SVT / LEFT: Stripped proximal GSV with reflux in distal aspect of GSV, no DVT / SVT.  The patient states the pain from the varicose veins interferes with work, daily exercise, shopping and household maintenance. At this point, the symptoms are persistent and severe enough that they're having a negative impact on lifestyle and are interfering with daily activities.  1. Chronic venous insufficiency - Worsening Patient has failed conservative therapy for over three months. Patient would benefit from  Left Lower Extremity GSV laser ablation. I have discussed the risks and benefits of the procedure. The risks primarily include DVT, recanalization, bleeding, infection, and inability to gain access.  2. Varicose veins of both lower extremities with pain- Worsening Patient would benefit from laser ablation following by sclerotherapy into her painful varicose veins.   3. Lymphedema - New Continue compression and elevation for now.   Current Outpatient Prescriptions on File Prior to Visit  Medication Sig Dispense Refill  . levothyroxine (SYNTHROID, LEVOTHROID) 75 MCG tablet Take 1 tablet (75 mcg total) by mouth daily. 90 tablet 1  . Multiple Vitamin (MULTI-VITAMINS) TABS Take by mouth.    . vitamin B-12 (CYANOCOBALAMIN) 1000 MCG tablet Take by mouth.     No current facility-administered medications on file prior to visit.     There are no Patient Instructions on file for this visit. No Follow-up on file.   KIMBERLY A STEGMAYER, PA-C

## 2016-10-23 DIAGNOSIS — K8020 Calculus of gallbladder without cholecystitis without obstruction: Secondary | ICD-10-CM | POA: Insufficient documentation

## 2016-11-09 ENCOUNTER — Encounter
Admission: RE | Admit: 2016-11-09 | Discharge: 2016-11-09 | Disposition: A | Discharge status: Home / Self Care | Payer: 59 | Source: Ambulatory Visit | Attending: Surgery | Admitting: Surgery

## 2016-11-09 HISTORY — DX: Unspecified asthma, uncomplicated: J45.909

## 2016-11-09 HISTORY — DX: Polyp of stomach and duodenum: K31.7

## 2016-11-09 HISTORY — DX: Impaired glucose tolerance (oral): R73.02

## 2016-11-09 HISTORY — DX: Other specified postprocedural states: R11.2

## 2016-11-09 HISTORY — DX: Vitamin D deficiency, unspecified: E55.9

## 2016-11-09 HISTORY — DX: Barrett's esophagus without dysplasia: K22.70

## 2016-11-09 HISTORY — DX: Acute embolism and thrombosis of unspecified deep veins of unspecified lower extremity: I82.409

## 2016-11-09 HISTORY — DX: Hyperlipidemia, unspecified: E78.5

## 2016-11-09 HISTORY — DX: Other specified postprocedural states: Z98.890

## 2016-11-09 HISTORY — DX: Chronic atrophic gastritis without bleeding: K29.40

## 2016-11-09 NOTE — Patient Instructions (Signed)
Your procedure is scheduled on: Thursday 11/16/16 Report to [place]. 2ND FLOOR MEDICAL MALL ENTRANCE. To find out your arrival time please call [phone] between 1PM - 3PM on Wednesday 11/15/16.  Remember: Instructions that are not followed completely may result in serious medical risk, up to and including death, or upon the discretion of your surgeon and anesthesiologist your surgery may need to be rescheduled.    __X__ 1. Do not eat food or drink liquids after midnight. No gum chewing or hard candies.     __X__ 2. No Alcohol for 24 hours before or after surgery.   ____ 3. Bring all medications with you on the day of surgery if instructed.    __X__ 4. Notify your doctor if there is any change in your medical condition     (cold, fever, infections).             __X___5. No smoking within 24 hours of your surgery.     Do not wear jewelry, make-up, hairpins, clips or nail polish.  Do not wear lotions, powders, or perfumes.   Do not shave 48 hours prior to surgery. Men may shave face and neck.  Do not bring valuables to the hospital.    [facility] is not responsible for any belongings or valuables.               Contacts, dentures or bridgework may not be worn into surgery.  Leave your suitcase in the car. After surgery it may be brought to your room.  For patients admitted to the hospital, discharge time is determined by your                treatment team.   Patients discharged the day of surgery will not be allowed to drive home.   Please read over the following fact sheets that you were given:   Pain Booklet and MRSA Information   __X__ Take these medicines the morning of surgery with A SIP OF WATER:    1. LEVOTHYROXINE  2. ZYRTEC  3.   4.  5.  6.  ____ Fleet Enema (as directed)   __x__ Use CHG Soap as directed  ____ Use inhalers on the day of surgery  ____ Stop metformin 2 days prior to surgery    ____ Take 1/2 of usual insulin dose the night before surgery  and none on the morning of surgery.   ____ Stop Coumadin/Plavix/aspirin on   __X__ Stop Anti-inflammatories such as Advil, Aleve, Ibuprofen, Motrin, Naproxen, Naprosyn, Goodies,powder, or aspirin products.  OK to take Tylenol.   ____ Stop supplements until after surgery.    ____ Bring C-Pap to the hospital.

## 2016-11-13 MED ORDER — SODIUM CHLORIDE 0.9 % IJ SOLN
INTRAMUSCULAR | Status: AC
  Filled 2016-11-13: qty 10

## 2016-11-13 MED ORDER — PROMETHAZINE HCL 25 MG/ML IJ SOLN
INTRAMUSCULAR | Status: AC
  Filled 2016-11-13: qty 1

## 2016-11-16 ENCOUNTER — Ambulatory Visit: Payer: 59

## 2016-11-16 ENCOUNTER — Encounter: Payer: Self-pay | Admitting: *Deleted

## 2016-11-16 ENCOUNTER — Ambulatory Visit: Payer: 59 | Admitting: Anesthesiology

## 2016-11-16 ENCOUNTER — Encounter
Admission: RE | Disposition: A | Discharge status: Home / Self Care | Payer: Self-pay | Source: Ambulatory Visit | Attending: Surgery

## 2016-11-16 ENCOUNTER — Ambulatory Visit
Admission: RE | Admit: 2016-11-16 | Discharge: 2016-11-16 | Disposition: A | Discharge status: Home / Self Care | Payer: 59 | Source: Ambulatory Visit | Attending: Surgery | Admitting: Surgery

## 2016-11-16 DIAGNOSIS — E039 Hypothyroidism, unspecified: Secondary | ICD-10-CM | POA: Insufficient documentation

## 2016-11-16 DIAGNOSIS — Z7982 Long term (current) use of aspirin: Secondary | ICD-10-CM | POA: Insufficient documentation

## 2016-11-16 DIAGNOSIS — Z86010 Personal history of colonic polyps: Secondary | ICD-10-CM | POA: Diagnosis not present

## 2016-11-16 DIAGNOSIS — I739 Peripheral vascular disease, unspecified: Secondary | ICD-10-CM | POA: Diagnosis not present

## 2016-11-16 DIAGNOSIS — E785 Hyperlipidemia, unspecified: Secondary | ICD-10-CM | POA: Insufficient documentation

## 2016-11-16 DIAGNOSIS — K8010 Calculus of gallbladder with chronic cholecystitis without obstruction: Secondary | ICD-10-CM | POA: Insufficient documentation

## 2016-11-16 DIAGNOSIS — F419 Anxiety disorder, unspecified: Secondary | ICD-10-CM | POA: Insufficient documentation

## 2016-11-16 DIAGNOSIS — K8020 Calculus of gallbladder without cholecystitis without obstruction: Secondary | ICD-10-CM

## 2016-11-16 DIAGNOSIS — J45909 Unspecified asthma, uncomplicated: Secondary | ICD-10-CM | POA: Diagnosis not present

## 2016-11-16 DIAGNOSIS — K8062 Calculus of gallbladder and bile duct with acute cholecystitis without obstruction: Secondary | ICD-10-CM

## 2016-11-16 DIAGNOSIS — Z79899 Other long term (current) drug therapy: Secondary | ICD-10-CM | POA: Insufficient documentation

## 2016-11-16 DIAGNOSIS — R7302 Impaired glucose tolerance (oral): Secondary | ICD-10-CM | POA: Insufficient documentation

## 2016-11-16 DIAGNOSIS — Z91040 Latex allergy status: Secondary | ICD-10-CM | POA: Diagnosis not present

## 2016-11-16 HISTORY — PX: CHOLECYSTECTOMY: SHX55

## 2016-11-16 SURGERY — LAPAROSCOPIC CHOLECYSTECTOMY WITH INTRAOPERATIVE CHOLANGIOGRAM
Anesthesia: General | Wound class: Clean Contaminated

## 2016-11-16 MED ORDER — SODIUM CHLORIDE 0.9 % IV SOLN
INTRAVENOUS | Status: DC | PRN
  Administered 2016-11-16: 15 mL

## 2016-11-16 MED ORDER — FENTANYL CITRATE (PF) 250 MCG/5ML IJ SOLN
INTRAMUSCULAR | Status: AC
  Filled 2016-11-16: qty 5

## 2016-11-16 MED ORDER — LACTATED RINGERS IV SOLN
INTRAVENOUS | Status: DC
  Administered 2016-11-16 (×2): via INTRAVENOUS

## 2016-11-16 MED ORDER — HYDROCODONE-ACETAMINOPHEN 5-325 MG PO TABS
1.0000 | ORAL_TABLET | ORAL | Status: DC | PRN
Start: 2016-11-16 — End: 2016-11-16

## 2016-11-16 MED ORDER — PROPOFOL 10 MG/ML IV BOLUS
INTRAVENOUS | Status: DC | PRN
  Administered 2016-11-16: 150 mg via INTRAVENOUS

## 2016-11-16 MED ORDER — EPHEDRINE SULFATE 50 MG/ML IJ SOLN
INTRAMUSCULAR | Status: DC | PRN
  Administered 2016-11-16: 15 mg via INTRAVENOUS
  Administered 2016-11-16: 10 mg via INTRAVENOUS

## 2016-11-16 MED ORDER — SUGAMMADEX SODIUM 200 MG/2ML IV SOLN
INTRAVENOUS | Status: AC
  Filled 2016-11-16: qty 2

## 2016-11-16 MED ORDER — SUCCINYLCHOLINE CHLORIDE 200 MG/10ML IV SOSY
PREFILLED_SYRINGE | INTRAVENOUS | Status: AC
  Filled 2016-11-16: qty 10

## 2016-11-16 MED ORDER — ROCURONIUM BROMIDE 100 MG/10ML IV SOLN
INTRAVENOUS | Status: DC | PRN
  Administered 2016-11-16: 10 mg via INTRAVENOUS
  Administered 2016-11-16: 30 mg via INTRAVENOUS

## 2016-11-16 MED ORDER — ROCURONIUM BROMIDE 50 MG/5ML IV SOSY
PREFILLED_SYRINGE | INTRAVENOUS | Status: AC
  Filled 2016-11-16: qty 5

## 2016-11-16 MED ORDER — KETOROLAC TROMETHAMINE 30 MG/ML IJ SOLN
INTRAMUSCULAR | Status: DC | PRN
  Administered 2016-11-16: 15 mg via INTRAVENOUS

## 2016-11-16 MED ORDER — HYDROCODONE-ACETAMINOPHEN 5-325 MG PO TABS: 1.0000 | ORAL_TABLET | ORAL | 0 refills | Status: DC | PRN

## 2016-11-16 MED ORDER — MIDAZOLAM HCL 2 MG/2ML IJ SOLN
INTRAMUSCULAR | Status: DC | PRN
  Administered 2016-11-16: 1 mg via INTRAVENOUS

## 2016-11-16 MED ORDER — PROPOFOL 10 MG/ML IV BOLUS
INTRAVENOUS | Status: AC
  Filled 2016-11-16: qty 40

## 2016-11-16 MED ORDER — FENTANYL CITRATE (PF) 100 MCG/2ML IJ SOLN
INTRAMUSCULAR | Status: DC | PRN
  Administered 2016-11-16 (×3): 50 ug via INTRAVENOUS

## 2016-11-16 MED ORDER — MIDAZOLAM HCL 2 MG/2ML IJ SOLN
INTRAMUSCULAR | Status: AC
  Filled 2016-11-16: qty 2

## 2016-11-16 MED ORDER — SUGAMMADEX SODIUM 500 MG/5ML IV SOLN
INTRAVENOUS | Status: DC | PRN
  Administered 2016-11-16 (×2): 200 mg via INTRAVENOUS

## 2016-11-16 MED ORDER — LIDOCAINE 2% (20 MG/ML) 5 ML SYRINGE
INTRAMUSCULAR | Status: AC
  Filled 2016-11-16: qty 5

## 2016-11-16 MED ORDER — ONDANSETRON HCL 4 MG/2ML IJ SOLN: 4.0000 mg | Freq: Once | INTRAMUSCULAR | Status: DC | PRN

## 2016-11-16 MED ORDER — FAMOTIDINE 20 MG PO TABS
20.0000 mg | ORAL_TABLET | Freq: Once | ORAL | Status: AC
  Administered 2016-11-16: 20 mg via ORAL

## 2016-11-16 MED ORDER — SODIUM CHLORIDE 0.9 % IV SOLN
INTRAVENOUS | Status: DC | PRN
  Administered 2016-11-16: 200 mL via INTRAMUSCULAR

## 2016-11-16 MED ORDER — FENTANYL CITRATE (PF) 100 MCG/2ML IJ SOLN
25.0000 ug | INTRAMUSCULAR | Status: DC | PRN
  Administered 2016-11-16 (×4): 25 ug via INTRAVENOUS

## 2016-11-16 MED ORDER — FAMOTIDINE 20 MG PO TABS
ORAL_TABLET | ORAL | Status: AC
  Filled 2016-11-16: qty 1

## 2016-11-16 MED ORDER — FENTANYL CITRATE (PF) 100 MCG/2ML IJ SOLN
INTRAMUSCULAR | Status: AC
  Filled 2016-11-16: qty 2

## 2016-11-16 MED ORDER — PROPOFOL 10 MG/ML IV BOLUS
INTRAVENOUS | Status: AC
  Filled 2016-11-16: qty 20

## 2016-11-16 MED ORDER — SUCCINYLCHOLINE CHLORIDE 20 MG/ML IJ SOLN
INTRAMUSCULAR | Status: DC | PRN
  Administered 2016-11-16: 100 mg via INTRAVENOUS

## 2016-11-16 MED ORDER — HEPARIN SODIUM (PORCINE) 5000 UNIT/ML IJ SOLN
INTRAMUSCULAR | Status: AC
  Filled 2016-11-16: qty 1

## 2016-11-16 MED ORDER — DEXAMETHASONE SODIUM PHOSPHATE 10 MG/ML IJ SOLN
INTRAMUSCULAR | Status: DC | PRN
  Administered 2016-11-16: 10 mg via INTRAVENOUS

## 2016-11-16 MED ORDER — ONDANSETRON HCL 4 MG/2ML IJ SOLN
INTRAMUSCULAR | Status: DC | PRN
  Administered 2016-11-16: 4 mg via INTRAVENOUS

## 2016-11-16 MED ORDER — DEXAMETHASONE SODIUM PHOSPHATE 10 MG/ML IJ SOLN
INTRAMUSCULAR | Status: AC
  Filled 2016-11-16: qty 1

## 2016-11-16 MED ORDER — SEVOFLURANE IN SOLN
RESPIRATORY_TRACT | Status: AC
  Filled 2016-11-16: qty 250

## 2016-11-16 MED ORDER — LIDOCAINE HCL (CARDIAC) 20 MG/ML IV SOLN
INTRAVENOUS | Status: DC | PRN
  Administered 2016-11-16: 100 mg via INTRAVENOUS

## 2016-11-16 MED ORDER — KETOROLAC TROMETHAMINE 30 MG/ML IJ SOLN
INTRAMUSCULAR | Status: AC
  Filled 2016-11-16: qty 1

## 2016-11-16 SURGICAL SUPPLY — 35 items
APPLIER CLIP ROT 10 11.4 M/L (STAPLE) ×2
CANISTER SUCT 1200ML W/VALVE (MISCELLANEOUS) ×2 IMPLANT
CANNULA DILATOR 10 W/SLV (CANNULA) ×2 IMPLANT
CATH REDDICK CHOLANGI 4FR 50CM (CATHETERS) ×2 IMPLANT
CHLORAPREP W/TINT 26ML (MISCELLANEOUS) ×2 IMPLANT
CLIP APPLIE ROT 10 11.4 M/L (STAPLE) ×1 IMPLANT
DRAPE SHEET LG 3/4 BI-LAMINATE (DRAPES) ×2 IMPLANT
ELECT REM PT RETURN 9FT ADLT (ELECTROSURGICAL) ×2
ELECTRODE REM PT RTRN 9FT ADLT (ELECTROSURGICAL) ×1 IMPLANT
GAUZE SPONGE 4X4 12PLY STRL (GAUZE/BANDAGES/DRESSINGS) ×2 IMPLANT
GLOVE BIO SURGEON STRL SZ7.5 (GLOVE) ×10 IMPLANT
GOWN STRL REUS W/ TWL LRG LVL3 (GOWN DISPOSABLE) ×3 IMPLANT
GOWN STRL REUS W/TWL LRG LVL3 (GOWN DISPOSABLE) ×3
IRRIGATION STRYKERFLOW (MISCELLANEOUS) ×1 IMPLANT
IRRIGATOR STRYKERFLOW (MISCELLANEOUS) ×2
IV NS 1000ML (IV SOLUTION) ×1
IV NS 1000ML BAXH (IV SOLUTION) ×1 IMPLANT
KIT RM TURNOVER STRD PROC AR (KITS) ×2 IMPLANT
LABEL OR SOLS (LABEL) ×2 IMPLANT
NDL INSUFF ACCESS 14 VERSASTEP (NEEDLE) ×2 IMPLANT
NEEDLE FILTER BLUNT 18X 1/2SAF (NEEDLE) ×1
NEEDLE FILTER BLUNT 18X1 1/2 (NEEDLE) ×1 IMPLANT
NS IRRIG 500ML POUR BTL (IV SOLUTION) ×2 IMPLANT
PACK LAP CHOLECYSTECTOMY (MISCELLANEOUS) ×2 IMPLANT
SCISSORS METZENBAUM CVD 33 (INSTRUMENTS) ×2 IMPLANT
SEAL FOR SCOPE WARMER C3101 (MISCELLANEOUS) IMPLANT
SLEEVE ENDOPATH XCEL 5M (ENDOMECHANICALS) ×2 IMPLANT
STRIP CLOSURE SKIN 1/2X4 (GAUZE/BANDAGES/DRESSINGS) ×2 IMPLANT
SUT CHROMIC 5 0 RB 1 27 (SUTURE) ×2 IMPLANT
SUT VIC AB 0 CT2 27 (SUTURE) IMPLANT
SYR 3ML LL SCALE MARK (SYRINGE) ×2 IMPLANT
TROCAR XCEL NON-BLD 11X100MML (ENDOMECHANICALS) ×2 IMPLANT
TROCAR XCEL NON-BLD 5MMX100MML (ENDOMECHANICALS) ×2 IMPLANT
TUBING INSUFFLATOR HI FLOW (MISCELLANEOUS) ×2 IMPLANT
WATER STERILE IRR 1000ML POUR (IV SOLUTION) ×2 IMPLANT

## 2016-11-16 NOTE — Anesthesia Procedure Notes (Signed)
Procedure Name: Intubation Date/Time: 11/16/2016 9:43 AM Performed by: Justus Memory Pre-anesthesia Checklist: Patient identified, Emergency Drugs available, Suction available and Patient being monitored Patient Re-evaluated:Patient Re-evaluated prior to inductionOxygen Delivery Method: Circle system utilized Preoxygenation: Pre-oxygenation with 100% oxygen Intubation Type: IV induction Ventilation: Mask ventilation without difficulty Laryngoscope Size: Miller and 2 Grade View: Grade I Tube type: Oral Tube size: 7.0 mm Number of attempts: 1 Airway Equipment and Method: Patient positioned with wedge pillow and Stylet Placement Confirmation: ETT inserted through vocal cords under direct vision,  positive ETCO2 and breath sounds checked- equal and bilateral Secured at: 19 (teeth) cm Tube secured with: Tape Dental Injury: Teeth and Oropharynx as per pre-operative assessment

## 2016-11-16 NOTE — Discharge Instructions (Addendum)
AMBULATORY SURGERY  DISCHARGE INSTRUCTIONS   1) The drugs that you were given will stay in your system until tomorrow so for the next 24 hours you should not:  A) Drive an automobile B) Make any legal decisions C) Drink any alcoholic beverage   2) You may resume regular meals tomorrow.  Today it is better to start with liquids and gradually work up to solid foods.  You may eat anything you prefer, but it is better to start with liquids, then soup and crackers, and gradually work up to solid foods.   3) Please notify your doctor immediately if you have any unusual bleeding, trouble breathing, redness and pain at the surgery site, drainage, fever, or pain not relieved by medication.    4) Additional Instructions:        Please contact your physician with any problems or Same Day Surgery at [phone], Monday through Friday 6 am to 4 pm, or [place] at [facility] number at [phone].Take Tylenol or Norco if needed for pain.  Should not drive or do anything dangerous when taking Norco.  Remove dressings on Saturday. May then take brief shower and blot dry.  Avoid straining and heavy lifting for the first week after surgery.

## 2016-11-16 NOTE — Op Note (Signed)
OPERATIVE REPORT  PREOPERATIVE DIAGNOSIS:  Chronic cholecystitis cholelithiasis  POSTOPERATIVE DIAGNOSIS: Chronic cholecystitis cholelithiasis  PROCEDURE: Laparoscopic cholecystectomy with cholangiogram  ANESTHESIA: General  SURGEON: Rochel Brome M.D.  INDICATIONS: She has history of right upper quadrant pains and ultrasound findings of a small gallstone and a small polyp. Surgery was recommended for definitive treatment.  With the patient on the operating table in the supine position under general endotracheal anesthesia the abdomen was prepared with ChloraPrep solution and draped in a sterile manner. A short incision was made in the inferior aspect of the umbilicus and carried down to the deep fascia which was grasped with a laryngeal hook. A Veress needle was inserted aspirated and irrigated with a saline solution. The peritoneal cavity was insufflated with carbon dioxide. The Veress needle was removed. The 10 mm cannula was inserted. The 10 mm 0 laparoscope was inserted to view the peritoneal cavity.  Another incision was made in the epigastrium slightly to the right of the midline to introduce an 11 mm cannula. 2 incisions were made in the lateral aspect of the right upper quadrant to introduce 2   5 mm cannulas. The surface of the liver was smooth. There were some adhesions around the gallbladder.The gallbladder was retracted towards the right shoulder. Multiple adhesions were taken down with blunt and sharp dissection.  The gallbladder neck was retracted inferiorly and laterally.  The porta hepatis was identified. The gallbladder was mobilized with incision of the visceral peritoneum. The cystic duct was dissected free from surrounding structures. The cystic artery was dissected free from surrounding structures. A critical view of safety was demonstrated  An Endo Clip was placed across the cystic duct adjacent to the gallbladder neck. An incision was made in the cystic duct to introduce a  Reddick catheter. The cholangiogram was done with injection of half-strength Conray 60 dye. This demonstrated the common hepatic bile duct and flow of dye into the duodenum. No retained stones were identified. The cholangiogram appeared normal. The Reddick catheter was removed. The cystic duct was doubly ligated with endoclips and divided. The cystic artery was controlled with double endoclips and divided. The gallbladder was dissected free from the liver with use of hook and cautery and blunt dissection. Bleeding was minimal and hemostasis was intact. The gallbladder was delivered up through the infraumbilical incision opened and suctioned. The gallbladder was delivered out of the abdomen and submitted in formalin for routine pathology. The skin incisions were closed with interrupted 5-0 chromic subcutaneous suture benzoin and Steri-Strips. Gauze dressings were applied with paper tape.  The patient appeared to be in satisfactory condition and was prepared for transfer to the recovery room  [place].D.

## 2016-11-16 NOTE — Progress Notes (Signed)
Pain level same at 3 and wants to go home

## 2016-11-16 NOTE — Progress Notes (Signed)
Dr. Smith into see 

## 2016-11-16 NOTE — Anesthesia Preprocedure Evaluation (Addendum)
Anesthesia Evaluation  Patient identified by MRN, date of birth, ID band Patient awake    History of Anesthesia Complications (+) PONV and history of anesthetic complications  Airway Mallampati: III  TM Distance: <3 FB     Dental no notable dental hx.    Pulmonary asthma ,    Pulmonary exam normal        Cardiovascular + Peripheral Vascular Disease  Normal cardiovascular exam     Neuro/Psych PSYCHIATRIC DISORDERS Anxiety    GI/Hepatic Neg liver ROS, Coon polyps...carcinoid tumor of stomach   Endo/Other  Hypothyroidism   Renal/GU negative Renal ROS     Musculoskeletal negative musculoskeletal ROS (+)   Abdominal Normal abdominal exam  (+)   Peds negative pediatric ROS (+)  Hematology negative hematology ROS (+)   Anesthesia Other Findings Past Medical History: No date: Allergy No date: Anxiety No date: Asthma No date: B12 deficiency No date: Barrett esophagus No date: Chronic atrophic gastritis No date: DVT, lower extremity (HCC) No date: Gastric polyps No date: HLD (hyperlipidemia) No date: Hypothyroidism No date: IGT (impaired glucose tolerance) No date: PONV (postoperative nausea and vomiting) No date: Vitamin D deficiency  Reproductive/Obstetrics                            Anesthesia Physical Anesthesia Plan  ASA: II  Anesthesia Plan: General   Post-op Pain Management:    Induction: Intravenous  Airway Management Planned: Oral ETT  Additional Equipment:   Intra-op Plan:   Post-operative Plan: Extubation in OR  Informed Consent: I have reviewed the patients History and Physical, chart, labs and discussed the procedure including the risks, benefits and alternatives for the proposed anesthesia with the patient or authorized representative who has indicated his/her understanding and acceptance.   Dental advisory given  Plan Discussed with: CRNA and  Surgeon  Anesthesia Plan Comments:         Anesthesia Quick Evaluation

## 2016-11-16 NOTE — H&P (Signed)
  She has history of right upper quadrant abdominal pains. She did have some pain yesterday. She also had recent ultrasound demonstrating a gallstone and a gallbladder polyp. Laparoscopic cholecystectomy was recommended for definitive treatment.  She reports no change in overall condition since the recent office visit.  I discussed the plan for surgery

## 2016-11-16 NOTE — Progress Notes (Signed)
Dressings dry and intact

## 2016-11-16 NOTE — H&P (Deleted)
  OPERATIVE REPORT  PREOPERATIVE DIAGNOSIS: right inguinal hernia  POSTOPERATIVE DIAGNOSIS:right  inguinal hernia  PROCEDURE:  right inguinal hernia repair  ANESTHESIA:  General  SURGEON:  Rochel Brome M.D.  INDICATIONS: He has had recent bulging in the right groin and a small right inguinal hernia was demonstrated on physical exam and repair is recommended for definitive treatment.  With the patient on the operating table in the supine position the right lower quadrant was prepared with clippers and with ChloraPrep and draped in a sterile manner. A transversely oriented suprapubic incision was made and carried down through subcutaneous tissues. Electrocautery was used for hemostasis. The Scarpa's fascia was incised. The external oblique aponeurosis was incised along the course of its fibers to open the external ring and expose the inguinal cord structures. The ilioinguinal nerve was demonstrated coursing medially. The cord structures were mobilized. A Penrose drain was passed around the cord structures for traction. There was a direct inguinal hernia and the floor of the inguinal canal. This was small in size. Cremaster fibers were separated to examine the cord structures and there was no indirect component. The repair was carried out with 0 Surgilon sutures suturing the conjoined tendon to the Cooper's ligament transition stitch on to the shelving edge of the inguinal ligament incorporating transversalis fascia into the repair. Bard soft mesh was cut to create an oval shape and was placed over the repair. This was sutured to the repair with interrupted 0 Surgilon sutures and also sutured medially to the deep fascia and on both sides of the internal ring. Next after seeing hemostasis was intact the cord structures were replaced along the floor of the inguinal canal. The cut edges of the external oblique aponeurosis were closed with a running 4-0 Vicryl suture to re-create the external ring. The deep  fascia superior and lateral to the repair site was infiltrated with half percent Sensorcaine with epinephrine. Subcutaneous tissues were also infiltrated. The Scarpa's fascia was closed with interrupted 4-0 Vicryl sutures. The skin was closed with running 4-0 Monocryl subcuticular suture and LiquiBand. The testicle remained in the scrotum  The patient appeared to be in satisfactory condition and was prepared for transfer to the recovery room.  Rochel Brome M.D.

## 2016-11-16 NOTE — Anesthesia Postprocedure Evaluation (Signed)
Anesthesia Post Note  Patient: Dana Hensley  Procedure(s) Performed: Procedure(s) (LRB): LAPAROSCOPIC CHOLECYSTECTOMY WITH INTRAOPERATIVE CHOLANGIOGRAM (N/A)  Patient location during evaluation: PACU Anesthesia Type: General Level of consciousness: awake and alert and oriented Pain management: pain level controlled Vital Signs Assessment: post-procedure vital signs reviewed and stable Respiratory status: spontaneous breathing Cardiovascular status: blood pressure returned to baseline Anesthetic complications: no     Last Vitals:  Vitals:   11/16/16 1311 11/16/16 1348  BP: 124/78 126/80  Pulse: 75 84  Resp: 14   Temp: 36.3 C     Last Pain:  Vitals:   11/16/16 1311  TempSrc: Temporal  PainSc:                  Kristi Norment

## 2016-11-16 NOTE — Transfer of Care (Signed)
Immediate Anesthesia Transfer of Care Note  Patient: Dana Hensley  Procedure(s) Performed: Procedure(s): LAPAROSCOPIC CHOLECYSTECTOMY WITH INTRAOPERATIVE CHOLANGIOGRAM (N/A)  Patient Location: PACU  Anesthesia Type:General  Level of Consciousness: sedated  Airway & Oxygen Therapy: Patient Spontanous Breathing and Patient connected to nasal cannula oxygen  Post-op Assessment: Report given to RN and Post -op Vital signs reviewed and stable  Post vital signs: Reviewed and stable  Last Vitals:  Vitals:   11/16/16 0842  BP: 124/71  Pulse: 61  Resp: 16  Temp: 36.7 C    Last Pain:  Vitals:   11/16/16 0842  TempSrc: Oral  PainSc: 3          Complications: No apparent anesthesia complications

## 2016-11-17 LAB — SURGICAL PATHOLOGY

## 2016-12-05 DIAGNOSIS — J309 Allergic rhinitis, unspecified: Secondary | ICD-10-CM | POA: Diagnosis not present

## 2016-12-08 DIAGNOSIS — J301 Allergic rhinitis due to pollen: Secondary | ICD-10-CM | POA: Diagnosis not present

## 2016-12-14 ENCOUNTER — Ambulatory Visit (INDEPENDENT_AMBULATORY_CARE_PROVIDER_SITE_OTHER): Payer: 59 | Admitting: Vascular Surgery

## 2016-12-14 ENCOUNTER — Other Ambulatory Visit (INDEPENDENT_AMBULATORY_CARE_PROVIDER_SITE_OTHER): Payer: 59 | Admitting: Vascular Surgery

## 2016-12-18 ENCOUNTER — Ambulatory Visit (INDEPENDENT_AMBULATORY_CARE_PROVIDER_SITE_OTHER): Payer: 59 | Admitting: Vascular Surgery

## 2016-12-18 ENCOUNTER — Encounter (INDEPENDENT_AMBULATORY_CARE_PROVIDER_SITE_OTHER): Payer: Self-pay | Admitting: Vascular Surgery

## 2016-12-18 VITALS — BP 112/72 | HR 60 | Resp 16 | Wt 156.4 lb

## 2016-12-18 DIAGNOSIS — I872 Venous insufficiency (chronic) (peripheral): Secondary | ICD-10-CM

## 2016-12-18 DIAGNOSIS — J301 Allergic rhinitis due to pollen: Secondary | ICD-10-CM | POA: Diagnosis not present

## 2016-12-18 DIAGNOSIS — M79609 Pain in unspecified limb: Secondary | ICD-10-CM | POA: Insufficient documentation

## 2016-12-18 DIAGNOSIS — I890 Lymphedema, not elsewhere classified: Secondary | ICD-10-CM | POA: Diagnosis not present

## 2016-12-18 DIAGNOSIS — I83813 Varicose veins of bilateral lower extremities with pain: Secondary | ICD-10-CM | POA: Diagnosis not present

## 2016-12-18 DIAGNOSIS — M79605 Pain in left leg: Secondary | ICD-10-CM | POA: Diagnosis not present

## 2016-12-18 NOTE — Progress Notes (Signed)
MRN : [id number]  Dana Hensley is a 56 y.o. (Mar 08, 1961) female who presents with chief complaint of  Chief Complaint  Patient presents with  . Follow-up  .  History of Present Illness: The patient returns for followup evaluation 3 months after the initial visit. The patient continues to have pain in the lower extremities with dependency. The pain is lessened with elevation. Graduated compression stockings, Class I (20-30 mmHg), have been worn but the stockings do not eliminate the leg pain. Over-the-counter analgesics do not improve the symptoms. The degree of discomfort continues to interfere with daily activities. The patient notes the pain in the legs is causing problems with daily exercise, at the workplace and even with household activities and maintenance such as standing in the kitchen preparing meals and doing dishes.   Venous ultrasound shows normal deep venous system, no evidence of acute or chronic DVT.  Superficial reflux is present in the left GSV  Current Meds  Medication Sig  . cetirizine (ZYRTEC) 10 MG tablet Take 10 mg by mouth daily.  Marland Kitchen HYDROcodone-acetaminophen (NORCO) 5-325 MG tablet Take 1-2 tablets by mouth every 4 (four) hours as needed for moderate pain.  Marland Kitchen levothyroxine (SYNTHROID, LEVOTHROID) 75 MCG tablet Take 1 tablet (75 mcg total) by mouth daily.  . Multiple Vitamin (MULTI-VITAMINS) TABS Take 1 tablet by mouth daily.   . pimecrolimus (ELIDEL) 1 % cream Apply 1 application topically 2 (two) times daily.   Marland Kitchen PRESCRIPTION MEDICATION Inject 0.1 mLs into the skin every 7 (seven) days. Friday Allergy Injection Patient gives herself at home  . vitamin B-12 (CYANOCOBALAMIN) 1000 MCG tablet Take 1,000 mcg by mouth daily.     Past Medical History:  Diagnosis Date  . Allergy   . Anxiety   . Asthma   . B12 deficiency   . Barrett esophagus   . Chronic atrophic gastritis   . DVT, lower extremity ([place])   . Gastric polyps   . HLD (hyperlipidemia)   .  Hypothyroidism   . IGT (impaired glucose tolerance)   . PONV (postoperative nausea and vomiting)   . Vitamin D deficiency     Past Surgical History:  Procedure Laterality Date  . ABDOMINAL HYSTERECTOMY    . APPENDECTOMY    . CHOLECYSTECTOMY N/A 11/16/2016   Procedure: LAPAROSCOPIC CHOLECYSTECTOMY WITH INTRAOPERATIVE CHOLANGIOGRAM;  Surgeon: Leonie Green, MD;  Location: [facility] ORS;  Service: General;  Laterality: N/A;  . DIAGNOSTIC LAPAROSCOPY    . ESOPHAGOGASTRODUODENOSCOPY    . TONSILLECTOMY    . VEIN LIGATION AND STRIPPING      Social History Social History  Substance Use Topics  . Smoking status: Never Smoker  . Smokeless tobacco: Never Used  . Alcohol use No    Family History Family History  Problem Relation Age of Onset  . Clotting disorder Mother   . Hyperlipidemia Father   No family history of bleeding/clotting disorders, porphyria or autoimmune disease   Allergies  Allergen Reactions  . Latex Swelling    SWELLING AROUND FACE SWELLING AROUND FACE  . Neomycin Other (See Comments) and Rash    Seen from allergy skin test Seen from allergy skin test  . Neosporin  [Neomycin-Bacitracin Zn-Polymyx] Other (See Comments)    Seen from allergy skin test  . Neomycin-Polymyxin-Gramicidin     Other reaction(s): Unknown Seen from allergy skin test  . Other Other (See Comments)    THIMERSOL. THIMERSOL- seen from allergy skin test Other reaction(s): Unknown THIMERSOL. THIMERSOL- seen from allergy  skin test     REVIEW OF SYSTEMS (Negative unless checked)  Constitutional: [] Weight loss  [] Fever  [] Chills Cardiac: [] Chest pain   [] Chest pressure   [] Palpitations   [] Shortness of breath when laying flat   [] Shortness of breath with exertion. Vascular:  [] Pain in legs with walking   [x] Pain in legs with standing  [] History of DVT   [] Phlebitis   [x] Swelling in legs   [x] Varicose veins   [] Non-healing ulcers Pulmonary:   [] Uses home oxygen   [] Productive cough    [] Hemoptysis   [] Wheeze  [] COPD   [] Asthma Neurologic:  [] Dizziness   [] Seizures   [] History of stroke   [] History of TIA  [] Aphasia   [] Vissual changes   [] Weakness or numbness in arm   [] Weakness or numbness in leg Musculoskeletal:   [] Joint swelling   [] Joint pain   [] Low back pain Hematologic:  [] Easy bruising  [] Easy bleeding   [] Hypercoagulable state   [] Anemic Gastrointestinal:  [] Diarrhea   [] Vomiting  [] Gastroesophageal reflux/heartburn   [] Difficulty swallowing. Genitourinary:  [] Chronic kidney disease   [] Difficult urination  [] Frequent urination   [] Blood in urine Skin:  [] Rashes   [] Ulcers  Psychological:  [] History of anxiety   []  History of major depression.  Physical Examination  Vitals:   12/18/16 1452  BP: 112/72  Pulse: 60  Resp: 16  Weight: 156 lb 6.4 oz (70.9 kg)   Body mass index is 26.03 kg/m. Gen: WD/WN, NAD Head: [place]/AT, No temporalis wasting.  Ear/Nose/Throat: Hearing grossly intact, nares w/o erythema or drainage, poor dentition Eyes: PER, EOMI, sclera nonicteric.  Neck: Supple, no masses.  No bruit or JVD.  Pulmonary:  Good air movement, clear to auscultation bilaterally, no use of accessory muscles.  Cardiac: RRR, normal S1, S2, no Murmurs. Vascular: large varicose veins of the left leg tender to palpation, >7 mm Vessel Right Left  Radial Palpable Palpable  Ulnar Palpable Palpable  Brachial Palpable Palpable  Carotid Palpable Palpable  Femoral Palpable Palpable  Popliteal Palpable Palpable  PT Palpable Palpable  DP Palpable Palpable   Gastrointestinal: soft, non-distended. No guarding/no peritoneal signs.  Musculoskeletal: M/S 5/5 throughout.  No deformity or atrophy.  Neurologic: CN 2-12 intact. Pain and light touch intact in extremities.  Symmetrical.  Speech is fluent. Motor exam as listed above. Psychiatric: Judgment intact, Mood & affect appropriate for pt's clinical situation. Dermatologic: No rashes or ulcers noted.  No changes consistent  with cellulitis. Lymph : No Cervical lymphadenopathy, no lichenification or skin changes of chronic lymphedema.  CBC Lab Results  Component Value Date   WBC 7.6 10/26/2015   HCT 37.1 10/26/2015   MCV 87 10/26/2015   PLT 261 10/26/2015    BMET    Component Value Date/Time   NA 142 10/26/2015 0947   K 4.7 10/26/2015 0947   CL 103 10/26/2015 0947   CO2 26 10/26/2015 0947   GLUCOSE 91 10/26/2015 0947   BUN 14 10/26/2015 0947   CREATININE 0.65 10/26/2015 0947   CALCIUM 9.5 10/26/2015 0947   GFRNONAA 102 10/26/2015 0947   GFRAA 117 10/26/2015 0947   CrCl cannot be calculated (Patient's most recent lab result is older than the maximum 21 days allowed.).  COAG No results found for: INR, PROTIME  Radiology No results found.  Assessment/Plan 1. Varicose veins of both lower extremities with pain Recommend  I have reviewed my previous  discussion with the patient regarding  varicose veins and why they cause symptoms. Patient will continue  wearing  graduated compression stockings class 1 on a daily basis, beginning first thing in the morning and removing them in the evening.    In addition, behavioral modification including elevation during the day was again discussed and this will continue.  The patient has utilized over the counter pain medications and has been exercising.  However, at this time conservative therapy has not alleviated the patient's symptoms of leg pain and swelling  Recommend: laser ablation of the right and  left great saphenous veins to eliminate the symptoms of pain and swelling of the lower extremities caused by the severe superficial venous reflux disease.   2. Pain of left lower extremity See #1  3. Chronic venous insufficiency No surgery or intervention at this point in time.    I have had a long discussion with the patient regarding venous insufficiency and why it  causes symptoms. I have discussed with the patient the chronic skin changes that  accompany venous insufficiency and the long term sequela such as infection and ulceration.  Patient will begin wearing graduated compression stockings class 1 (20-30 mmHg) or compression wraps on a daily basis a prescription was given. The patient will put the stockings on first thing in the morning and removing them in the evening. The patient is instructed specifically not to sleep in the stockings.    In addition, behavioral modification including several periods of elevation of the lower extremities during the day will be continued. I have demonstrated that proper elevation is a position with the ankles at heart level.  The patient is instructed to begin routine exercise, especially walking on a daily basis  Following the review of the ultrasound the patient will follow up in 2-3 months to reassess the degree of swelling and the control that graduated compression stockings or compression wraps  is offering.   The patient can be assessed for a Lymph Pump at that time  4. Lymphedema See #3  Hortencia Pilar, MD  12/18/2016 5:23 PM

## 2016-12-21 ENCOUNTER — Encounter (INDEPENDENT_AMBULATORY_CARE_PROVIDER_SITE_OTHER): Payer: Self-pay | Admitting: Vascular Surgery

## 2016-12-21 ENCOUNTER — Ambulatory Visit (INDEPENDENT_AMBULATORY_CARE_PROVIDER_SITE_OTHER): Payer: 59 | Admitting: Vascular Surgery

## 2016-12-21 VITALS — BP 108/71 | HR 71 | Resp 16 | Wt 156.0 lb

## 2016-12-21 DIAGNOSIS — I83813 Varicose veins of bilateral lower extremities with pain: Secondary | ICD-10-CM | POA: Diagnosis not present

## 2016-12-21 DIAGNOSIS — I872 Venous insufficiency (chronic) (peripheral): Secondary | ICD-10-CM

## 2016-12-21 NOTE — Progress Notes (Signed)
    MRN : [id number]  Dana Hensley is a 56 y.o. (11-29-1960) female who presents with chief complaint of painful varicose veins.    The patient's left lower extremity was sterilely prepped and draped.  The ultrasound machine was used to visualize the great saphenous vein throughout its course.  A segment above the ankle was selected for access.  The saphenous vein was accessed without difficulty using ultrasound guidance with a micropuncture needle.   An 0.018  wire was placed beyond the saphenofemoral junction through the sheath and the microneedle was removed.  The 65 cm sheath was then placed over the wire and the wire and dilator were removed.  The laser fiber was placed through the sheath and its tip was placed approximately 2 cm below the saphenofemoral junction.  Tumescent anesthesia was then created with a dilute lidocaine solution.  Laser energy was then delivered with constant withdrawal of the sheath and laser fiber.  Approximately 382 Joules of energy were delivered over a length of 10 cm.  Sterile dressings were placed.  The patient tolerated the procedure well without complications.

## 2016-12-25 ENCOUNTER — Other Ambulatory Visit (INDEPENDENT_AMBULATORY_CARE_PROVIDER_SITE_OTHER): Payer: 59

## 2016-12-25 DIAGNOSIS — I83813 Varicose veins of bilateral lower extremities with pain: Secondary | ICD-10-CM | POA: Diagnosis not present

## 2017-01-15 DIAGNOSIS — R1013 Epigastric pain: Secondary | ICD-10-CM | POA: Diagnosis not present

## 2017-01-15 DIAGNOSIS — R634 Abnormal weight loss: Secondary | ICD-10-CM | POA: Insufficient documentation

## 2017-01-15 DIAGNOSIS — K219 Gastro-esophageal reflux disease without esophagitis: Secondary | ICD-10-CM | POA: Diagnosis not present

## 2017-01-15 DIAGNOSIS — D3A092 Benign carcinoid tumor of the stomach: Secondary | ICD-10-CM | POA: Diagnosis not present

## 2017-01-18 ENCOUNTER — Ambulatory Visit (INDEPENDENT_AMBULATORY_CARE_PROVIDER_SITE_OTHER): Payer: 59 | Admitting: Vascular Surgery

## 2017-01-22 ENCOUNTER — Ambulatory Visit (INDEPENDENT_AMBULATORY_CARE_PROVIDER_SITE_OTHER): Payer: 59 | Admitting: Vascular Surgery

## 2017-01-22 ENCOUNTER — Encounter (INDEPENDENT_AMBULATORY_CARE_PROVIDER_SITE_OTHER): Payer: Self-pay | Admitting: Vascular Surgery

## 2017-01-22 VITALS — BP 108/72 | HR 74 | Resp 16 | Ht 65.0 in | Wt 154.0 lb

## 2017-01-22 DIAGNOSIS — I83813 Varicose veins of bilateral lower extremities with pain: Secondary | ICD-10-CM | POA: Diagnosis not present

## 2017-01-22 DIAGNOSIS — I872 Venous insufficiency (chronic) (peripheral): Secondary | ICD-10-CM | POA: Diagnosis not present

## 2017-01-22 DIAGNOSIS — E785 Hyperlipidemia, unspecified: Secondary | ICD-10-CM

## 2017-01-22 DIAGNOSIS — M79605 Pain in left leg: Secondary | ICD-10-CM | POA: Diagnosis not present

## 2017-01-22 DIAGNOSIS — I890 Lymphedema, not elsewhere classified: Secondary | ICD-10-CM

## 2017-01-23 DIAGNOSIS — R1013 Epigastric pain: Secondary | ICD-10-CM | POA: Diagnosis not present

## 2017-01-23 DIAGNOSIS — M542 Cervicalgia: Secondary | ICD-10-CM | POA: Diagnosis not present

## 2017-01-23 DIAGNOSIS — M791 Myalgia: Secondary | ICD-10-CM | POA: Diagnosis not present

## 2017-01-23 DIAGNOSIS — M9901 Segmental and somatic dysfunction of cervical region: Secondary | ICD-10-CM | POA: Diagnosis not present

## 2017-01-23 DIAGNOSIS — D3A092 Benign carcinoid tumor of the stomach: Secondary | ICD-10-CM | POA: Diagnosis not present

## 2017-01-24 NOTE — Progress Notes (Signed)
MRN : [id number]  Dana Hensley is a 56 y.o. (January 30, 1961) female who presents with chief complaint of  Chief Complaint  Patient presents with  . Re-evaluation    3 week post laser  .  History of Present Illness: The patient returns to the office for followup status post laser ablation of the left great saphenous vein. The patient notes multiple residual varicosities bilaterally which continued to hurt with dependent positions and remained tender to palpation. The patient's swelling is unchanged from preoperative status. The patient continues to wear graduated compression stockings on a daily basis but these are not eliminating the pain and discomfort. The patient continues to use over-the-counter anti-inflammatory medications to treat the pain and related symptoms but this has not given the patient relief. The patient notes the pain in the lower extremities is causing problems with daily exercise, problems at work and even with household activities such as preparing meals and doing dishes.  The patient is otherwise done well and there have been no complications related to the laser procedure or interval changes in the patient's overall   Venous ultrasound post laser shows successful laser ablation of the left GSV, no DVT identified.  Current Meds  Medication Sig  . aspirin EC 81 MG tablet Take by mouth.  . cetirizine (ZYRTEC) 10 MG tablet Take 10 mg by mouth daily.  . Cyanocobalamin (RA VITAMIN B-12 TR) 1000 MCG TBCR Take by mouth.  Marland Kitchen HYDROcodone-acetaminophen (NORCO) 5-325 MG tablet Take 1-2 tablets by mouth every 4 (four) hours as needed for moderate pain.  Marland Kitchen levothyroxine (SYNTHROID, LEVOTHROID) 75 MCG tablet Take 1 tablet (75 mcg total) by mouth daily.  . Multiple Vitamin (MULTI-VITAMINS) TABS Take 1 tablet by mouth daily.   Marland Kitchen omeprazole (PRILOSEC) 20 MG capsule Take by mouth.  . pimecrolimus (ELIDEL) 1 % cream Apply 1 application topically 2 (two) times daily.   Marland Kitchen PRESCRIPTION  MEDICATION Inject 0.1 mLs into the skin every 7 (seven) days. Friday Allergy Injection Patient gives herself at home  . vitamin B-12 (CYANOCOBALAMIN) 1000 MCG tablet Take 1,000 mcg by mouth daily.     Past Medical History:  Diagnosis Date  . Allergy   . Anxiety   . Asthma   . B12 deficiency   . Barrett esophagus   . Chronic atrophic gastritis   . DVT, lower extremity ([place])   . Gastric polyps   . HLD (hyperlipidemia)   . Hypothyroidism   . IGT (impaired glucose tolerance)   . PONV (postoperative nausea and vomiting)   . Vitamin D deficiency     Past Surgical History:  Procedure Laterality Date  . ABDOMINAL HYSTERECTOMY    . APPENDECTOMY    . CHOLECYSTECTOMY N/A 11/16/2016   Procedure: LAPAROSCOPIC CHOLECYSTECTOMY WITH INTRAOPERATIVE CHOLANGIOGRAM;  Surgeon: Leonie Green, MD;  Location: [facility] ORS;  Service: General;  Laterality: N/A;  . DIAGNOSTIC LAPAROSCOPY    . ESOPHAGOGASTRODUODENOSCOPY    . TONSILLECTOMY    . VEIN LIGATION AND STRIPPING      Social History Social History  Substance Use Topics  . Smoking status: Never Smoker  . Smokeless tobacco: Never Used  . Alcohol use No    Family History Family History  Problem Relation Age of Onset  . Clotting disorder Mother   . Hyperlipidemia Father   No family history of bleeding/clotting disorders, porphyria or autoimmune disease  Allergies  Allergen Reactions  . Latex Swelling    SWELLING AROUND FACE SWELLING AROUND FACE  . Neomycin  Other (See Comments) and Rash    Seen from allergy skin test Seen from allergy skin test  . Neosporin  [Neomycin-Bacitracin Zn-Polymyx] Other (See Comments)    Seen from allergy skin test  . Neomycin-Polymyxin-Gramicidin     Other reaction(s): Unknown Seen from allergy skin test  . Other Other (See Comments)    THIMERSOL. THIMERSOL- seen from allergy skin test Other reaction(s): Unknown THIMERSOL. THIMERSOL- seen from allergy skin test     REVIEW OF SYSTEMS (Negative  unless checked)  Constitutional: [] Weight loss  [] Fever  [] Chills Cardiac: [] Chest pain   [] Chest pressure   [] Palpitations   [] Shortness of breath when laying flat   [] Shortness of breath with exertion. Vascular:  [] Pain in legs with walking   [x] Pain in legs with standing  [] History of DVT   [] Phlebitis   [x] Swelling in legs   [x] Varicose veins   [] Non-healing ulcers Pulmonary:   [] Uses home oxygen   [] Productive cough   [] Hemoptysis   [] Wheeze  [] COPD   [] Asthma Neurologic:  [] Dizziness   [] Seizures   [] History of stroke   [] History of TIA  [] Aphasia   [] Vissual changes   [] Weakness or numbness in arm   [] Weakness or numbness in leg Musculoskeletal:   [] Joint swelling   [] Joint pain   [] Low back pain Hematologic:  [] Easy bruising  [] Easy bleeding   [] Hypercoagulable state   [] Anemic Gastrointestinal:  [] Diarrhea   [] Vomiting  [] Gastroesophageal reflux/heartburn   [] Difficulty swallowing. Genitourinary:  [] Chronic kidney disease   [] Difficult urination  [] Frequent urination   [] Blood in urine Skin:  [] Rashes   [] Ulcers  Psychological:  [] History of anxiety   []  History of major depression.  Physical Examination  Vitals:   01/22/17 1637  BP: 108/72  Pulse: 74  Resp: 16  Weight: 154 lb (69.9 kg)  Height: 5\' 5"  (1.651 m)   Body mass index is 25.63 kg/m. Gen: WD/WN, NAD Head: [place]/AT, No temporalis wasting.  Ear/Nose/Throat: Hearing grossly intact, nares w/o erythema or drainage, poor dentition Eyes: PER, EOMI, sclera nonicteric.  Neck: Supple, no masses.  No bruit or JVD.  Pulmonary:  Good air movement, clear to auscultation bilaterally, no use of accessory muscles.  Cardiac: RRR, normal S1, S2, no Murmurs. Vascular: large varicose veins left leg >29mm Vessel Right Left  Radial Palpable Palpable  Ulnar Palpable Palpable  Brachial Palpable Palpable  Carotid Palpable Palpable  Femoral Palpable Palpable  Popliteal Palpable Palpable  PT Palpable Palpable  DP Palpable Palpable    Gastrointestinal: soft, non-distended. No guarding/no peritoneal signs.  Musculoskeletal: M/S 5/5 throughout.  No deformity or atrophy.  Neurologic: CN 2-12 intact. Pain and light touch intact in extremities.  Symmetrical.  Speech is fluent. Motor exam as listed above. Psychiatric: Judgment intact, Mood & affect appropriate for pt's clinical situation. Dermatologic: No rashes or ulcers noted.  No changes consistent with cellulitis. Lymph : No Cervical lymphadenopathy, no lichenification or skin changes of chronic lymphedema.  CBC Lab Results  Component Value Date   WBC 7.6 10/26/2015   HCT 37.1 10/26/2015   MCV 87 10/26/2015   PLT 261 10/26/2015    BMET    Component Value Date/Time   NA 142 10/26/2015 0947   K 4.7 10/26/2015 0947   CL 103 10/26/2015 0947   CO2 26 10/26/2015 0947   GLUCOSE 91 10/26/2015 0947   BUN 14 10/26/2015 0947   CREATININE 0.65 10/26/2015 0947   CALCIUM 9.5 10/26/2015 0947   GFRNONAA 102 10/26/2015 0947   GFRAA  117 10/26/2015 0947   CrCl cannot be calculated (Patient's most recent lab result is older than the maximum 21 days allowed.).  COAG No results found for: INR, PROTIME  Radiology No results found.  Assessment/Plan 1. Varicose veins of both lower extremities with pain Recommend:  The patient has had successful ablation of the previously incompetent saphenous venous system but still has persistent symptoms of pain and swelling that are having a negative impact on daily life and daily activities.  Patient should undergo injection sclerotherapy to treat the residual varicosities.  The risks, benefits and alternative therapies were reviewed in detail with the patient.  All questions were answered.  The patient agrees to proceed with sclerotherapy at their convenience.  The patient will continue wearing the graduated compression stockings and using the over-the-counter pain medications to treat her symptoms.    2. Chronic venous  insufficiency Continue compression  3. Pain of left lower extremity Move forward with sclerotherapy  4. Lymphedema Continue compression  5. Dyslipidemia Continue statin as ordered and reviewed, no changes at this time   Hortencia Pilar, MD  01/24/2017 9:09 PM

## 2017-01-25 DIAGNOSIS — R1013 Epigastric pain: Secondary | ICD-10-CM | POA: Diagnosis not present

## 2017-01-25 DIAGNOSIS — K3189 Other diseases of stomach and duodenum: Secondary | ICD-10-CM | POA: Diagnosis not present

## 2017-01-25 DIAGNOSIS — E785 Hyperlipidemia, unspecified: Secondary | ICD-10-CM | POA: Diagnosis not present

## 2017-01-25 DIAGNOSIS — K2270 Barrett's esophagus without dysplasia: Secondary | ICD-10-CM | POA: Diagnosis not present

## 2017-01-25 DIAGNOSIS — C7A092 Malignant carcinoid tumor of the stomach: Secondary | ICD-10-CM | POA: Diagnosis not present

## 2017-01-25 DIAGNOSIS — Z86718 Personal history of other venous thrombosis and embolism: Secondary | ICD-10-CM | POA: Diagnosis not present

## 2017-02-13 DIAGNOSIS — M9901 Segmental and somatic dysfunction of cervical region: Secondary | ICD-10-CM | POA: Diagnosis not present

## 2017-02-13 DIAGNOSIS — M542 Cervicalgia: Secondary | ICD-10-CM | POA: Diagnosis not present

## 2017-02-13 DIAGNOSIS — M791 Myalgia: Secondary | ICD-10-CM | POA: Diagnosis not present

## 2017-02-19 ENCOUNTER — Encounter (INDEPENDENT_AMBULATORY_CARE_PROVIDER_SITE_OTHER): Payer: Self-pay | Admitting: Vascular Surgery

## 2017-02-19 ENCOUNTER — Ambulatory Visit (INDEPENDENT_AMBULATORY_CARE_PROVIDER_SITE_OTHER): Payer: 59 | Admitting: Vascular Surgery

## 2017-02-19 VITALS — BP 128/85 | HR 63 | Resp 16 | Ht 65.0 in | Wt 154.0 lb

## 2017-02-19 DIAGNOSIS — I872 Venous insufficiency (chronic) (peripheral): Secondary | ICD-10-CM

## 2017-02-19 DIAGNOSIS — M79605 Pain in left leg: Secondary | ICD-10-CM

## 2017-02-19 DIAGNOSIS — I83813 Varicose veins of bilateral lower extremities with pain: Secondary | ICD-10-CM

## 2017-02-19 NOTE — Progress Notes (Signed)
MRN : [id number]  NATILIE KRABBENHOFT is a 56 y.o. (1961/01/15) female who presents with chief complaint of  Chief Complaint  Patient presents with  . Varicose Veins    Left leg sclerotherapy  .  History of Present Illness:  Current Meds  Medication Sig  . ALPRAZolam (XANAX) 0.5 MG tablet   . aspirin EC 81 MG tablet Take by mouth.  . cetirizine (ZYRTEC) 10 MG tablet Take 10 mg by mouth daily.  . Cyanocobalamin (RA VITAMIN B-12 TR) 1000 MCG TBCR Take by mouth.  . esomeprazole (NEXIUM) 20 MG capsule Take by mouth.  Marland Kitchen HYDROcodone-acetaminophen (NORCO) 5-325 MG tablet Take 1-2 tablets by mouth every 4 (four) hours as needed for moderate pain.  Marland Kitchen levothyroxine (SYNTHROID, LEVOTHROID) 75 MCG tablet Take 1 tablet (75 mcg total) by mouth daily.  . Multiple Vitamin (MULTI-VITAMINS) TABS Take 1 tablet by mouth daily.   . pimecrolimus (ELIDEL) 1 % cream Apply 1 application topically 2 (two) times daily.   Marland Kitchen PRESCRIPTION MEDICATION Inject 0.1 mLs into the skin every 7 (seven) days. Friday Allergy Injection Patient gives herself at home  . vitamin B-12 (CYANOCOBALAMIN) 1000 MCG tablet Take 1,000 mcg by mouth daily.     Past Medical History:  Diagnosis Date  . Allergy   . Anxiety   . Asthma   . B12 deficiency   . Barrett esophagus   . Chronic atrophic gastritis   . DVT, lower extremity ([place])   . Gastric polyps   . HLD (hyperlipidemia)   . Hypothyroidism   . IGT (impaired glucose tolerance)   . PONV (postoperative nausea and vomiting)   . Vitamin D deficiency     Past Surgical History:  Procedure Laterality Date  . ABDOMINAL HYSTERECTOMY    . APPENDECTOMY    . CHOLECYSTECTOMY N/A 11/16/2016   Procedure: LAPAROSCOPIC CHOLECYSTECTOMY WITH INTRAOPERATIVE CHOLANGIOGRAM;  Surgeon: Leonie Green, MD;  Location: [facility] ORS;  Service: General;  Laterality: N/A;  . DIAGNOSTIC LAPAROSCOPY    . ESOPHAGOGASTRODUODENOSCOPY    . TONSILLECTOMY    . VEIN LIGATION AND STRIPPING      Social  History Social History  Substance Use Topics  . Smoking status: Never Smoker  . Smokeless tobacco: Never Used  . Alcohol use No    Family History Family History  Problem Relation Age of Onset  . Clotting disorder Mother   . Hyperlipidemia Father     Allergies  Allergen Reactions  . Latex Swelling    SWELLING AROUND FACE SWELLING AROUND FACE  . Neomycin Other (See Comments) and Rash    Seen from allergy skin test Seen from allergy skin test  . Neosporin  [Neomycin-Bacitracin Zn-Polymyx] Other (See Comments)    Seen from allergy skin test  . Neomycin-Polymyxin-Gramicidin     Other reaction(s): Unknown Seen from allergy skin test  . Other Other (See Comments)    THIMERSOL. THIMERSOL- seen from allergy skin test Other reaction(s): Unknown THIMERSOL. THIMERSOL- seen from allergy skin test     REVIEW OF SYSTEMS (Negative unless checked)  Constitutional: [] Weight loss  [] Fever  [] Chills Cardiac: [] Chest pain   [] Chest pressure   [] Palpitations   [] Shortness of breath when laying flat   [] Shortness of breath with exertion. Vascular:  [] Pain in legs with walking   [] Pain in legs at rest  [] History of DVT   [] Phlebitis   [] Swelling in legs   [] Varicose veins   [] Non-healing ulcers Pulmonary:   [] Uses home oxygen   [] Productive  cough   [] Hemoptysis   [] Wheeze  [] COPD   [] Asthma Neurologic:  [] Dizziness   [] Seizures   [] History of stroke   [] History of TIA  [] Aphasia   [] Vissual changes   [] Weakness or numbness in arm   [] Weakness or numbness in leg Musculoskeletal:   [] Joint swelling   [] Joint pain   [] Low back pain Hematologic:  [] Easy bruising  [] Easy bleeding   [] Hypercoagulable state   [] Anemic Gastrointestinal:  [] Diarrhea   [] Vomiting  [] Gastroesophageal reflux/heartburn   [] Difficulty swallowing. Genitourinary:  [] Chronic kidney disease   [] Difficult urination  [] Frequent urination   [] Blood in urine Skin:  [] Rashes   [] Ulcers  Psychological:  [] History of anxiety   []   History of major depression.  Physical Examination  Vitals:   02/19/17 1526  BP: 128/85  Pulse: 63  Resp: 16  Weight: 154 lb (69.9 kg)  Height: 5\' 5"  (1.651 m)   Body mass index is 25.63 kg/m. Gen: WD/WN, NAD Head: [place]/AT, No temporalis wasting.  Ear/Nose/Throat: Hearing grossly intact, nares w/o erythema or drainage, poor dentition Eyes: PER, EOMI, sclera nonicteric.  Neck: Supple, no masses.  No bruit or JVD.  Pulmonary:  Good air movement, clear to auscultation bilaterally, no use of accessory muscles.  Cardiac: RRR, normal S1, S2, no Murmurs. Vascular:  Vessel Right Left  Radial Palpable Palpable  Ulnar Palpable Palpable  Brachial Palpable Palpable  Carotid Palpable Palpable  Femoral Palpable Palpable  Popliteal Palpable Palpable  PT Palpable Palpable  DP Palpable Palpable   Gastrointestinal: soft, non-distended. No guarding/no peritoneal signs.  Musculoskeletal: M/S 5/5 throughout.  No deformity or atrophy.  Neurologic: CN 2-12 intact. Pain and light touch intact in extremities.  Symmetrical.  Speech is fluent. Motor exam as listed above. Psychiatric: Judgment intact, Mood & affect appropriate for pt's clinical situation. Dermatologic: No rashes or ulcers noted.  No changes consistent with cellulitis. Lymph : No Cervical lymphadenopathy, no lichenification or skin changes of chronic lymphedema.  CBC Lab Results  Component Value Date   WBC 7.6 10/26/2015   HCT 37.1 10/26/2015   MCV 87 10/26/2015   PLT 261 10/26/2015    BMET    Component Value Date/Time   NA 142 10/26/2015 0947   K 4.7 10/26/2015 0947   CL 103 10/26/2015 0947   CO2 26 10/26/2015 0947   GLUCOSE 91 10/26/2015 0947   BUN 14 10/26/2015 0947   CREATININE 0.65 10/26/2015 0947   CALCIUM 9.5 10/26/2015 0947   GFRNONAA 102 10/26/2015 0947   GFRAA 117 10/26/2015 0947   CrCl cannot be calculated (Patient's most recent lab result is older than the maximum 21 days allowed.).  COAG No results  found for: INR, PROTIME  Radiology No results found.  Assessment/Plan Hortencia Pilar, MD  02/19/2017 7:50 PM

## 2017-02-22 ENCOUNTER — Ambulatory Visit (HOSPITAL_COMMUNITY)
Admission: RE | Admit: 2017-02-22 | Discharge: 2017-02-22 | Disposition: A | Discharge status: Home / Self Care | Payer: 59 | Source: Ambulatory Visit | Attending: Cardiovascular Disease | Admitting: Cardiovascular Disease

## 2017-02-22 DIAGNOSIS — D3A092 Benign carcinoid tumor of the stomach: Secondary | ICD-10-CM | POA: Diagnosis not present

## 2017-02-22 DIAGNOSIS — M79605 Pain in left leg: Secondary | ICD-10-CM | POA: Diagnosis not present

## 2017-02-22 DIAGNOSIS — E785 Hyperlipidemia, unspecified: Secondary | ICD-10-CM | POA: Diagnosis not present

## 2017-02-22 DIAGNOSIS — K219 Gastro-esophageal reflux disease without esophagitis: Secondary | ICD-10-CM | POA: Diagnosis not present

## 2017-02-22 DIAGNOSIS — K2270 Barrett's esophagus without dysplasia: Secondary | ICD-10-CM | POA: Diagnosis not present

## 2017-03-02 DIAGNOSIS — J301 Allergic rhinitis due to pollen: Secondary | ICD-10-CM | POA: Diagnosis not present

## 2017-03-19 ENCOUNTER — Encounter (INDEPENDENT_AMBULATORY_CARE_PROVIDER_SITE_OTHER): Payer: Self-pay | Admitting: Vascular Surgery

## 2017-03-19 ENCOUNTER — Ambulatory Visit (INDEPENDENT_AMBULATORY_CARE_PROVIDER_SITE_OTHER): Payer: 59 | Admitting: Vascular Surgery

## 2017-03-19 VITALS — BP 113/74 | HR 58 | Resp 16 | Wt 154.0 lb

## 2017-03-19 DIAGNOSIS — I83813 Varicose veins of bilateral lower extremities with pain: Secondary | ICD-10-CM

## 2017-03-19 DIAGNOSIS — I872 Venous insufficiency (chronic) (peripheral): Secondary | ICD-10-CM

## 2017-03-19 DIAGNOSIS — J301 Allergic rhinitis due to pollen: Secondary | ICD-10-CM | POA: Diagnosis not present

## 2017-03-19 NOTE — Progress Notes (Signed)
    MRN : [id number]  Dana Hensley is a 56 y.o. (October 07, 1961) female who presents with chief complaint of painful varicose veins.   Procedure:  Sclerotherapy using hypertonic saline mixed with 1% Lidocaine was performed on the right lower extremity.  Compression wraps were placed.  The patient tolerated the procedure well.  Plan:  Follow up as arranged

## 2017-03-20 ENCOUNTER — Other Ambulatory Visit: Payer: Self-pay | Admitting: Family Medicine

## 2017-04-06 ENCOUNTER — Other Ambulatory Visit (HOSPITAL_COMMUNITY): Payer: Self-pay | Admitting: Gastroenterology

## 2017-04-06 DIAGNOSIS — R10816 Epigastric abdominal tenderness: Secondary | ICD-10-CM

## 2017-04-06 DIAGNOSIS — K219 Gastro-esophageal reflux disease without esophagitis: Secondary | ICD-10-CM | POA: Diagnosis not present

## 2017-04-09 ENCOUNTER — Encounter (INDEPENDENT_AMBULATORY_CARE_PROVIDER_SITE_OTHER): Payer: Self-pay | Admitting: Vascular Surgery

## 2017-04-09 ENCOUNTER — Ambulatory Visit (INDEPENDENT_AMBULATORY_CARE_PROVIDER_SITE_OTHER): Payer: 59 | Admitting: Vascular Surgery

## 2017-04-09 VITALS — BP 112/69 | HR 58 | Resp 16 | Ht 65.0 in | Wt 154.0 lb

## 2017-04-09 DIAGNOSIS — I83813 Varicose veins of bilateral lower extremities with pain: Secondary | ICD-10-CM | POA: Diagnosis not present

## 2017-04-09 NOTE — Progress Notes (Signed)
    MRN : [id number]  Dana Hensley is a 56 y.o. (Nov 04, 1961) female who presents with chief complaint of  Chief Complaint  Patient presents with  . Varicose Veins    Left leg sclero  .   Procedure:  Sclerotherapy using hypertonic saline mixed with 1% Lidocaine was performed on lower extremities bilateral.  Compression wraps were placed.  The patient tolerated the procedure well.  Plan:  Follow up as arranged

## 2017-04-16 ENCOUNTER — Emergency Department: Payer: 59

## 2017-04-16 ENCOUNTER — Emergency Department
Admission: EM | Admit: 2017-04-16 | Discharge: 2017-04-16 | Disposition: A | Discharge status: Home / Self Care | Payer: 59 | Attending: Emergency Medicine | Admitting: Emergency Medicine

## 2017-04-16 DIAGNOSIS — J45909 Unspecified asthma, uncomplicated: Secondary | ICD-10-CM | POA: Diagnosis not present

## 2017-04-16 DIAGNOSIS — R0789 Other chest pain: Secondary | ICD-10-CM | POA: Diagnosis present

## 2017-04-16 DIAGNOSIS — R1011 Right upper quadrant pain: Secondary | ICD-10-CM | POA: Diagnosis not present

## 2017-04-16 DIAGNOSIS — R079 Chest pain, unspecified: Secondary | ICD-10-CM

## 2017-04-16 DIAGNOSIS — R1013 Epigastric pain: Secondary | ICD-10-CM | POA: Diagnosis not present

## 2017-04-16 LAB — HEPATIC FUNCTION PANEL
ALK PHOS: 92 U/L (ref 38–126)
ALT: 250 U/L — AB (ref 14–54)
AST: 425 U/L — ABNORMAL HIGH (ref 15–41)
Albumin: 3.8 g/dL (ref 3.5–5.0)
BILIRUBIN DIRECT: 0.2 mg/dL (ref 0.1–0.5)
BILIRUBIN INDIRECT: 0.6 mg/dL (ref 0.3–0.9)
Total Bilirubin: 0.8 mg/dL (ref 0.3–1.2)
Total Protein: 7.1 g/dL (ref 6.5–8.1)

## 2017-04-16 LAB — BASIC METABOLIC PANEL
ANION GAP: 6 (ref 5–15)
BUN: 15 mg/dL (ref 6–20)
CHLORIDE: 103 mmol/L (ref 101–111)
CO2: 25 mmol/L (ref 22–32)
Calcium: 8.6 mg/dL — ABNORMAL LOW (ref 8.9–10.3)
Creatinine, Ser: 0.59 mg/dL (ref 0.44–1.00)
GFR calc Af Amer: >60 mL/min (ref >60)
GFR calc non Af Amer: >60 mL/min (ref >60)
GLUCOSE: 112 mg/dL — AB (ref 65–99)
POTASSIUM: 3.4 mmol/L — AB (ref 3.5–5.1)
Sodium: 134 mmol/L — ABNORMAL LOW (ref 135–145)

## 2017-04-16 LAB — CBC
HCT: 35.8 % (ref 35.0–47.0)
Hemoglobin: 11.8 g/dL — ABNORMAL LOW (ref 12.0–16.0)
MCH: 28.8 pg (ref 26.0–34.0)
MCHC: 33 g/dL (ref 32.0–36.0)
MCV: 87.2 fL (ref 80.0–100.0)
PLATELETS: 202 10*3/uL (ref 150–440)
RBC: 4.11 MIL/uL (ref 3.80–5.20)
RDW: 13.4 % (ref 11.5–14.5)
WBC: 8.7 10*3/uL (ref 3.6–11.0)

## 2017-04-16 LAB — TROPONIN I: Troponin I: <0.03 ng/mL (ref <0.03)

## 2017-04-16 LAB — LIPASE, BLOOD: LIPASE: 34 U/L (ref 11–51)

## 2017-04-16 MED ORDER — ONDANSETRON HCL 4 MG/2ML IJ SOLN
4.0000 mg | Freq: Once | INTRAMUSCULAR | Status: AC
  Administered 2017-04-16: 4 mg via INTRAVENOUS

## 2017-04-16 MED ORDER — ONDANSETRON HCL 4 MG/2ML IJ SOLN
INTRAMUSCULAR | Status: AC
  Administered 2017-04-16: 4 mg via INTRAVENOUS
  Filled 2017-04-16: qty 2

## 2017-04-16 MED ORDER — GI COCKTAIL ~~LOC~~
30.0000 mL | Freq: Once | ORAL | Status: AC
  Administered 2017-04-16: 30 mL via ORAL
  Filled 2017-04-16: qty 30

## 2017-04-16 NOTE — ED Notes (Signed)
Pt called out for N/V; clear/brown emesis noted on floor next to pt's bed. EDP to be made aware.

## 2017-04-16 NOTE — Discharge Instructions (Signed)
You have been seen in the emergency department today for chest pain. Your workup has shown normal results. As we discussed please follow-up with your primary care physician in the next 1-2 days for recheck. Return to the emergency department for any further chest pain, trouble breathing, or any other symptom personally concerning to yourself.  Please follow-up with your doctor to have her liver function tests rechecked in the next 1 week. They are moderately elevated today in the 200-400 range. Return to the emergency department for any worsening abdominal pain, fever.

## 2017-04-16 NOTE — ED Notes (Signed)
Pt states pain in epigastric area is better, now complaining of back pain. Pt stretcher readjusted.

## 2017-04-16 NOTE — ED Notes (Signed)
Pt verbalizes understanding of Discharge instructions

## 2017-04-16 NOTE — ED Notes (Signed)
Dr. Paduchowski at bedside.  

## 2017-04-16 NOTE — ED Provider Notes (Signed)
[facility] Emergency Department Provider Note  Time seen: 5:03 PM  I have reviewed the triage vital signs and the nursing notes.   HISTORY  Chief Complaint Chest Pain    HPI Dana Hensley is a 56 y.o. female with a past medical history of anxiety, Barrett's esophagus, hyperlipidemia, presents the emergency department for chest pain. According to the patient since last night she has not been feeling well, states she has been feeling very tired and fatigued. She went to bed early around 7 PM shortly after eating dinner. She states she woke around 9:00 this morning with chest discomfort. She states it started in her epigastrium and continued to progress into her chest. She thought that it was likely from gastric reflux, patient states a history of Barrett's esophagitis. However she states it would not go away and even into the afternoon she continues to have mild discomfort in the epigastrium up into the chest. Denies any pleuritic nature to the pain. Denies any shortness of breath. Denies any nausea. Denies diaphoresis. Patient states the discomfort is much improved but still present. Besides generalized fatigue the patient's review of systems is otherwise negative.  Past Medical History:  Diagnosis Date  . Allergy   . Anxiety   . Asthma   . B12 deficiency   . Barrett esophagus   . Chronic atrophic gastritis   . DVT, lower extremity ([place])   . Gastric polyps   . HLD (hyperlipidemia)   . Hypothyroidism   . IGT (impaired glucose tolerance)   . PONV (postoperative nausea and vomiting)   . Vitamin D deficiency     Patient Active Problem List   Diagnosis Date Noted  . Pain in limb 12/18/2016  . Chronic venous insufficiency 10/16/2016  . Varicose veins of both lower extremities with pain 10/16/2016  . Lymphedema 10/16/2016  . Barrett's esophagus determined by endoscopy 01/25/2016  . Vitamin B12 deficiency 10/18/2015  . Chronic neck and back pain 08/19/2015  .  Body odor 07/09/2015  . GAD (generalized anxiety disorder) 07/09/2015  . Allergic rhinitis with postnasal drip 07/09/2015  . Dyslipidemia 05/05/2015  . Acquired hypothyroidism 05/05/2015  . History of malignant carcinoid tumor of stomach 03/26/2012    Past Surgical History:  Procedure Laterality Date  . ABDOMINAL HYSTERECTOMY    . APPENDECTOMY    . CHOLECYSTECTOMY N/A 11/16/2016   Procedure: LAPAROSCOPIC CHOLECYSTECTOMY WITH INTRAOPERATIVE CHOLANGIOGRAM;  Surgeon: Leonie Green, MD;  Location: [facility] ORS;  Service: General;  Laterality: N/A;  . DIAGNOSTIC LAPAROSCOPY    . ESOPHAGOGASTRODUODENOSCOPY    . TONSILLECTOMY    . VEIN LIGATION AND STRIPPING      Prior to Admission medications   Medication Sig Start Date End Date Taking? Authorizing Provider  ALPRAZolam Duanne Moron) 0.5 MG tablet  12/19/16   [provider]  aspirin EC 81 MG tablet Take by mouth.    [provider]  cetirizine (ZYRTEC) 10 MG tablet Take 10 mg by mouth daily.    [provider]  Cyanocobalamin (RA VITAMIN B-12 TR) 1000 MCG TBCR Take by mouth.    [provider]  esomeprazole (NEXIUM) 20 MG capsule Take by mouth.    [provider]  HYDROcodone-acetaminophen (NORCO) 5-325 MG tablet Take 1-2 tablets by mouth every 4 (four) hours as needed for moderate pain. 11/16/16   Leonie Green, MD  levothyroxine (SYNTHROID, LEVOTHROID) 75 MCG tablet Take 1 tablet (75 mcg total) by mouth daily. 03/15/16   Arnetha Courser, MD  Multiple Vitamin (MULTI-VITAMINS) TABS Take 1 tablet by mouth daily.  10/07/04   [provider]  pimecrolimus (ELIDEL) 1 % cream Apply 1 application topically 2 (two) times daily.  06/02/15   [provider]  PRESCRIPTION MEDICATION Inject 0.1 mLs into the skin every 7 (seven) days. Friday Allergy Injection Patient gives herself at home    [provider]  vitamin B-12 (CYANOCOBALAMIN) 1000 MCG tablet Take 1,000 mcg by mouth  daily.     [provider]    Allergies  Allergen Reactions  . Latex Swelling    SWELLING AROUND FACE SWELLING AROUND FACE  . Neomycin Other (See Comments) and Rash    Seen from allergy skin test Seen from allergy skin test  . Neosporin  [Neomycin-Bacitracin Zn-Polymyx] Other (See Comments)    Seen from allergy skin test  . Neomycin-Polymyxin-Gramicidin     Other reaction(s): Unknown Seen from allergy skin test  . Other Other (See Comments)    THIMERSOL. THIMERSOL- seen from allergy skin test Other reaction(s): Unknown THIMERSOL. THIMERSOL- seen from allergy skin test    Family History  Problem Relation Age of Onset  . Clotting disorder Mother   . Hyperlipidemia Father     Social History Social History  Substance Use Topics  . Smoking status: Never Smoker  . Smokeless tobacco: Never Used  . Alcohol use No    Review of Systems Constitutional: Negative for fever. Eyes: Negative for visual changes. ENT: Negative for congestion Cardiovascular: Chest discomfort. Respiratory: Negative for shortness of breath. Gastrointestinal: Epigastric discomfort/chest discomfort. Negative for nausea vomiting or diarrhea Genitourinary: Negative for dysuria. Musculoskeletal: Mild back pain, but states she has been lifting heavy objects over the weekend. Skin: Negative for rash. Neurological: Negative for headache All other ROS negative  ____________________________________________   PHYSICAL EXAM:  VITAL SIGNS: ED Triage Vitals  Enc Vitals Group     BP 04/16/17 1422 138/80     Pulse Rate 04/16/17 1422 65     Resp 04/16/17 1422 19     Temp 04/16/17 1422 97.9 F (36.6 C)     Temp Source 04/16/17 1422 Oral     SpO2 04/16/17 1422 99 %     Weight 04/16/17 1422 154 lb (69.9 kg)     Height 04/16/17 1422 5\' 5"  (1.651 m)     Head Circumference --      Peak Flow --      Pain Score 04/16/17 1421 8     Pain Loc --      Pain Edu? --      Excl. in [place]? --      Constitutional: Alert and oriented. Well appearing and in no distress. Eyes: Normal exam ENT   Head: Normocephalic and atraumatic.   Mouth/Throat: Mucous membranes are moist. Cardiovascular: Normal rate, regular rhythm. No murmur Respiratory: Normal respiratory effort without tachypnea nor retractions. Breath sounds are clear  Gastrointestinal: Soft and nontender. No distention.   Musculoskeletal: Nontender with normal range of motion in all extremities. No lower extremity tenderness or edema.  Neurologic:  Normal speech and language. No gross focal neurologic deficits  Skin:  Skin is warm, dry and intact.  Psychiatric: Mood and affect are normal.   ____________________________________________    EKG  EKG reviewed and interpreted by myself shows normal sinus rhythm at 66 bpm, narrow QRS, normal axis, normal intervals, no concerning ST changes. Normal EKG.  ____________________________________________    RADIOLOGY  Chest x-ray negative  ____________________________________________   INITIAL IMPRESSION / ASSESSMENT  AND PLAN / ED COURSE  Pertinent labs & imaging results that were available during my care of the patient were reviewed by me and considered in my medical decision making (see chart for details).  Patient presents to the emergency department for generalized fatigue since last night along with epigastric discomfort radiating into the chest since this morning. Patient states she thought it was her Barrett's esophagus/gastric reflux at first but the pain did not appear to go away which concerned her so she came to the emergent department for evaluation. Here states very mild discomfort currently. Denies any shortness of breath, nausea or diaphoresis. Patient's initial lab work is very reassuring including a negative troponin, normal EKG and negative chest x-ray. We will repeat a troponin, treat with GI cocktail closely monitor in the emergency department. Overall  the patient appears very well, no distress.  Patient's lipase is normal. Liver function tests are elevated at right upper quadrant ultrasound is normal. Patient states she feels much better and wishes to go home. She has follow-up with GI medicine next week. I discussed with patient following up with her doctor to have her liver enzymes rechecked within the next 1-2 weeks. Patient agreeable to plan.  ____________________________________________   FINAL CLINICAL IMPRESSION(S) / ED DIAGNOSES  Upper abdominal pain/chest pain    Harvest Dark, MD 04/16/17 2114

## 2017-04-16 NOTE — ED Triage Notes (Signed)
Pt presents to ED c/o of centralized chest pressure since this morning. Pt admits to s/s of nausea and back pain since yesterday. SHe took pain meds without relief

## 2017-04-19 DIAGNOSIS — Z09 Encounter for follow-up examination after completed treatment for conditions other than malignant neoplasm: Secondary | ICD-10-CM | POA: Diagnosis not present

## 2017-04-19 DIAGNOSIS — R7989 Other specified abnormal findings of blood chemistry: Secondary | ICD-10-CM | POA: Diagnosis not present

## 2017-04-19 DIAGNOSIS — Z1159 Encounter for screening for other viral diseases: Secondary | ICD-10-CM | POA: Diagnosis not present

## 2017-04-19 DIAGNOSIS — Z79899 Other long term (current) drug therapy: Secondary | ICD-10-CM | POA: Diagnosis not present

## 2017-04-19 DIAGNOSIS — E039 Hypothyroidism, unspecified: Secondary | ICD-10-CM | POA: Diagnosis not present

## 2017-04-24 ENCOUNTER — Encounter (HOSPITAL_COMMUNITY): Payer: Self-pay | Admitting: Radiology

## 2017-04-24 ENCOUNTER — Ambulatory Visit (HOSPITAL_COMMUNITY)
Admission: RE | Admit: 2017-04-24 | Discharge: 2017-04-24 | Disposition: A | Discharge status: Home / Self Care | Payer: 59 | Source: Ambulatory Visit | Attending: Gastroenterology | Admitting: Gastroenterology

## 2017-04-24 DIAGNOSIS — R109 Unspecified abdominal pain: Secondary | ICD-10-CM | POA: Diagnosis not present

## 2017-04-24 DIAGNOSIS — M9901 Segmental and somatic dysfunction of cervical region: Secondary | ICD-10-CM | POA: Diagnosis not present

## 2017-04-24 DIAGNOSIS — R10816 Epigastric abdominal tenderness: Secondary | ICD-10-CM | POA: Insufficient documentation

## 2017-04-24 DIAGNOSIS — M542 Cervicalgia: Secondary | ICD-10-CM | POA: Diagnosis not present

## 2017-04-24 DIAGNOSIS — M9902 Segmental and somatic dysfunction of thoracic region: Secondary | ICD-10-CM | POA: Diagnosis not present

## 2017-04-24 MED ORDER — TECHNETIUM TC 99M SULFUR COLLOID
2.0000 | Freq: Once | INTRAVENOUS | Status: AC | PRN
  Administered 2017-04-24: 2 via ORAL

## 2017-04-25 DIAGNOSIS — M9901 Segmental and somatic dysfunction of cervical region: Secondary | ICD-10-CM | POA: Diagnosis not present

## 2017-04-25 DIAGNOSIS — M9902 Segmental and somatic dysfunction of thoracic region: Secondary | ICD-10-CM | POA: Diagnosis not present

## 2017-04-25 DIAGNOSIS — M542 Cervicalgia: Secondary | ICD-10-CM | POA: Diagnosis not present

## 2017-04-25 NOTE — Progress Notes (Signed)
    MRN : [id number]  Dana Hensley is a 56 y.o. (01/12/1961) female who presents with chief complaint of No chief complaint on file. .   Procedure:  Sclerotherapy using hypertonic saline mixed with 1% Lidocaine was performed on lower extremities bilateral.  Compression wraps were placed.  The patient tolerated the procedure well.  Plan:  Follow up as arranged

## 2017-04-26 ENCOUNTER — Ambulatory Visit (INDEPENDENT_AMBULATORY_CARE_PROVIDER_SITE_OTHER): Payer: 59 | Admitting: Vascular Surgery

## 2017-04-26 VITALS — BP 113/74 | HR 82 | Resp 15 | Ht 64.0 in | Wt 150.0 lb

## 2017-04-26 DIAGNOSIS — I83813 Varicose veins of bilateral lower extremities with pain: Secondary | ICD-10-CM

## 2017-04-26 DIAGNOSIS — I872 Venous insufficiency (chronic) (peripheral): Secondary | ICD-10-CM

## 2017-04-30 ENCOUNTER — Ambulatory Visit (INDEPENDENT_AMBULATORY_CARE_PROVIDER_SITE_OTHER): Payer: 59 | Admitting: Vascular Surgery

## 2017-04-30 DIAGNOSIS — M542 Cervicalgia: Secondary | ICD-10-CM | POA: Diagnosis not present

## 2017-04-30 DIAGNOSIS — M9901 Segmental and somatic dysfunction of cervical region: Secondary | ICD-10-CM | POA: Diagnosis not present

## 2017-04-30 DIAGNOSIS — M9902 Segmental and somatic dysfunction of thoracic region: Secondary | ICD-10-CM | POA: Diagnosis not present

## 2017-05-02 DIAGNOSIS — R748 Abnormal levels of other serum enzymes: Secondary | ICD-10-CM | POA: Diagnosis not present

## 2017-05-03 DIAGNOSIS — M9901 Segmental and somatic dysfunction of cervical region: Secondary | ICD-10-CM | POA: Diagnosis not present

## 2017-05-03 DIAGNOSIS — M542 Cervicalgia: Secondary | ICD-10-CM | POA: Diagnosis not present

## 2017-05-03 DIAGNOSIS — M9902 Segmental and somatic dysfunction of thoracic region: Secondary | ICD-10-CM | POA: Diagnosis not present

## 2017-05-08 DIAGNOSIS — M9902 Segmental and somatic dysfunction of thoracic region: Secondary | ICD-10-CM | POA: Diagnosis not present

## 2017-05-08 DIAGNOSIS — M542 Cervicalgia: Secondary | ICD-10-CM | POA: Diagnosis not present

## 2017-05-08 DIAGNOSIS — M9901 Segmental and somatic dysfunction of cervical region: Secondary | ICD-10-CM | POA: Diagnosis not present

## 2017-05-28 DIAGNOSIS — R748 Abnormal levels of other serum enzymes: Secondary | ICD-10-CM | POA: Diagnosis not present

## 2017-05-29 ENCOUNTER — Other Ambulatory Visit: Payer: Self-pay | Admitting: Nurse Practitioner

## 2017-05-29 DIAGNOSIS — Z1231 Encounter for screening mammogram for malignant neoplasm of breast: Secondary | ICD-10-CM

## 2017-05-31 ENCOUNTER — Ambulatory Visit (INDEPENDENT_AMBULATORY_CARE_PROVIDER_SITE_OTHER): Payer: 59 | Admitting: Vascular Surgery

## 2017-06-01 DIAGNOSIS — J301 Allergic rhinitis due to pollen: Secondary | ICD-10-CM | POA: Diagnosis not present

## 2017-06-06 DIAGNOSIS — R140 Abdominal distension (gaseous): Secondary | ICD-10-CM | POA: Diagnosis not present

## 2017-06-06 DIAGNOSIS — R1084 Generalized abdominal pain: Secondary | ICD-10-CM | POA: Diagnosis not present

## 2017-06-11 ENCOUNTER — Other Ambulatory Visit: Payer: Self-pay | Admitting: Gastroenterology

## 2017-06-11 DIAGNOSIS — R1084 Generalized abdominal pain: Secondary | ICD-10-CM

## 2017-06-11 DIAGNOSIS — J301 Allergic rhinitis due to pollen: Secondary | ICD-10-CM | POA: Diagnosis not present

## 2017-06-11 DIAGNOSIS — R1013 Epigastric pain: Secondary | ICD-10-CM

## 2017-06-14 ENCOUNTER — Ambulatory Visit
Admission: RE | Admit: 2017-06-14 | Discharge: 2017-06-14 | Disposition: A | Discharge status: Home / Self Care | Payer: 59 | Source: Ambulatory Visit | Attending: Gastroenterology | Admitting: Gastroenterology

## 2017-06-14 DIAGNOSIS — R1084 Generalized abdominal pain: Secondary | ICD-10-CM

## 2017-06-14 DIAGNOSIS — R1013 Epigastric pain: Secondary | ICD-10-CM

## 2017-06-14 MED ORDER — IOPAMIDOL (ISOVUE-300) INJECTION 61%
100.0000 mL | Freq: Once | INTRAVENOUS | Status: AC | PRN
  Administered 2017-06-14: 100 mL via INTRAVENOUS

## 2017-06-22 ENCOUNTER — Ambulatory Visit
Admission: RE | Admit: 2017-06-22 | Discharge: 2017-06-22 | Disposition: A | Discharge status: Home / Self Care | Payer: 59 | Source: Ambulatory Visit | Attending: Nurse Practitioner | Admitting: Nurse Practitioner

## 2017-06-22 ENCOUNTER — Ambulatory Visit (INDEPENDENT_AMBULATORY_CARE_PROVIDER_SITE_OTHER): Payer: 59 | Admitting: Obstetrics and Gynecology

## 2017-06-22 ENCOUNTER — Encounter: Payer: Self-pay | Admitting: Obstetrics and Gynecology

## 2017-06-22 VITALS — BP 120/80 | HR 63 | Ht 65.0 in | Wt 150.0 lb

## 2017-06-22 DIAGNOSIS — Z1231 Encounter for screening mammogram for malignant neoplasm of breast: Secondary | ICD-10-CM | POA: Diagnosis not present

## 2017-06-22 DIAGNOSIS — Z0000 Encounter for general adult medical examination without abnormal findings: Secondary | ICD-10-CM

## 2017-06-22 DIAGNOSIS — Z01419 Encounter for gynecological examination (general) (routine) without abnormal findings: Secondary | ICD-10-CM | POA: Diagnosis not present

## 2017-06-22 MED ORDER — HYDROCORTISONE 2.5 % RE CREA: 1.0000 "application " | TOPICAL_CREAM | Freq: Two times a day (BID) | RECTAL | 3 refills | Status: DC

## 2017-06-24 NOTE — Progress Notes (Signed)
Gynecology Annual Exam  PCP: Sallee Lange, NP  Chief Complaint:  Chief Complaint  Patient presents with  . Gynecologic Exam    History of Present Illness:Patient is a 56 y.o. No obstetric history on file. presents for annual exam. The patient has no complaints today.   LMP: No LMP recorded. Patient has had a hysterectomy. Some moderate vasomotor symptoms  The patient is sexually active. She denies dyspareunia.  The patient does perform self breast exams.  There is no notable family history of breast or ovarian cancer in her family.  The patient wears seatbelts: yes.   The patient has regular exercise: not asked.    The patient denies current symptoms of depression.     Review of Systems: Review of Systems  Constitutional: Negative for chills and fever.  HENT: Negative for congestion.   Respiratory: Negative for cough and shortness of breath.   Cardiovascular: Negative for chest pain and palpitations.  Gastrointestinal: Negative for abdominal pain, constipation, diarrhea, heartburn, nausea and vomiting.  Genitourinary: Negative for dysuria, frequency and urgency.  Skin: Negative for itching and rash.  Neurological: Negative for dizziness and headaches.  Endo/Heme/Allergies: Negative for polydipsia.  Psychiatric/Behavioral: Negative for depression.    Past Medical History:  Past Medical History:  Diagnosis Date  . Allergy   . Anxiety   . Asthma   . B12 deficiency   . Barrett esophagus   . Chronic atrophic gastritis   . DVT, lower extremity ([place])   . Gastric polyps   . HLD (hyperlipidemia)   . Hypothyroidism   . IGT (impaired glucose tolerance)   . PONV (postoperative nausea and vomiting)   . Vitamin D deficiency     Past Surgical History:  Past Surgical History:  Procedure Laterality Date  . ABDOMINAL HYSTERECTOMY    . APPENDECTOMY    . CHOLECYSTECTOMY N/A 11/16/2016   Procedure: LAPAROSCOPIC CHOLECYSTECTOMY WITH INTRAOPERATIVE CHOLANGIOGRAM;   Surgeon: Leonie Green, MD;  Location: [facility] ORS;  Service: General;  Laterality: N/A;  . DIAGNOSTIC LAPAROSCOPY    . ESOPHAGOGASTRODUODENOSCOPY    . TONSILLECTOMY    . VEIN LIGATION AND STRIPPING      Gynecologic History:  No LMP recorded. Patient has had a hysterectomy. Last Pap: Results were: 1 year ago no abnormalities  Last mammogram: obtained today results pending  Obstetric History: No obstetric history on file.  Family History:  Family History  Problem Relation Age of Onset  . Clotting disorder Mother   . Hyperlipidemia Father   . Breast cancer Neg Hx     Social History:  Social History   Social History  . Marital status: Married    Spouse name: N/A  . Number of children: N/A  . Years of education: N/A   Occupational History  . Not on file.   Social History Main Topics  . Smoking status: Never Smoker  . Smokeless tobacco: Never Used  . Alcohol use No  . Drug use: No  . Sexual activity: Yes    Birth control/ protection: None   Other Topics Concern  . Not on file   Social History Narrative  . No narrative on file    Allergies:  Allergies  Allergen Reactions  . Latex Swelling    SWELLING AROUND FACE SWELLING AROUND FACE  . Neomycin Other (See Comments) and Rash    Seen from allergy skin test Seen from allergy skin test  . Neosporin  [Neomycin-Bacitracin Zn-Polymyx] Other (See Comments)    Seen  from allergy skin test  . Neomycin-Polymyxin-Gramicidin     Other reaction(s): Unknown Seen from allergy skin test  . Other Other (See Comments)    THIMERSOL. THIMERSOL- seen from allergy skin test Other reaction(s): Unknown THIMERSOL. THIMERSOL- seen from allergy skin test    Medications: Prior to Admission medications   Medication Sig Start Date End Date Taking? Authorizing Provider  cetirizine (ZYRTEC) 10 MG tablet Take 10 mg by mouth daily.   Yes [provider]  Cyanocobalamin (RA VITAMIN B-12 TR) 1000 MCG TBCR Take by mouth.   Yes  [provider]  EPINEPHrine 0.3 mg/0.3 mL IJ SOAJ injection Inject into the muscle. 03/27/17  Yes [provider]  esomeprazole (NEXIUM) 20 MG capsule Take by mouth.   Yes [provider]  levothyroxine (SYNTHROID, LEVOTHROID) 75 MCG tablet Take 1 tablet (75 mcg total) by mouth daily. 03/15/16  Yes Lada, Satira Anis, MD  Multiple Vitamin (MULTI-VITAMINS) TABS Take 1 tablet by mouth daily.  10/07/04  Yes [provider]  PARoxetine (PAXIL) 10 MG tablet Take by mouth. 04/19/17  Yes [provider]  hydrocortisone (ANUSOL-HC) 2.5 % rectal cream Place 1 application rectally 2 (two) times daily. 06/22/17   Malachy Mood, MD    Physical Exam Vitals: Blood pressure 120/80, pulse 63, height 5\' 5"  (1.651 m), weight 150 lb (68 kg).  General: NAD HEENT: normocephalic, anicteric Thyroid: no enlargement, no palpable nodules Pulmonary: No increased work of breathing, CTAB Cardiovascular: RRR, distal pulses 2+ Breast: Breast symmetrical, no tenderness, no palpable nodules or masses, no skin or nipple retraction present, no nipple discharge.  No axillary or supraclavicular lymphadenopathy. Abdomen: NABS, soft, non-tender, non-distended.  Umbilicus without lesions.  No hepatomegaly, splenomegaly or masses palpable. No evidence of hernia  Genitourinary:  External: Normal external female genitalia.  Normal urethral meatus, normal  Bartholin's and Skene's glands.    Vagina: Normal vaginal mucosa, no evidence of prolapse.    Cervix: Surgically absent  Uterus: Surgically absent  Adnexa: Surgically absent  Rectal: deferred  Lymphatic: no evidence of inguinal lymphadenopathy Extremities: no edema, erythema, or tenderness Neurologic: Grossly intact Psychiatric: mood appropriate, affect full  Female chaperone present for pelvic and breast  portions of the physical exam    Assessment: 56 y.o. routine annual exam  Plan: Problem List Items Addressed This Visit     None      1) Mammogram - recommend yearly screening mammogram.  Mammogram was obtained today prior to visit, read pending  2) STI screening was not offered  3) ASCCP guidelines and rational discussed. Discontinue given no cervix 4) Osteoporosis  - per USPTF routine screening DEXA at age 26 consider obtaining early given prior BSO and early menopause  5) Routine healthcare maintenance including cholesterol, diabetes screening discussed managed by PCP  6) Colonoscopy Screening recommended starting at age 31 for average risk individuals, age 32 for individuals deemed at increased risk (including African Americans) and recommended to continue until age 4.  For patient age 82-85 individualized approach is recommended.  Gold standard screening is via colonoscopy, Cologuard screening is an acceptable alternative for patient unwilling or unable to undergo colonoscopy.  "Colorectal cancer screening for average?risk adults: 2018 guideline update from the American Cancer Society"CA: A Cancer Journal for Clinicians: Apr 25, 2017  - previously obtained at age 57 per patient  7) Follow up 1 year for routine annual

## 2017-06-27 ENCOUNTER — Telehealth: Payer: Self-pay

## 2017-06-27 NOTE — Telephone Encounter (Signed)
Annual was 7/27 w/AMS. Please advise

## 2017-06-27 NOTE — Telephone Encounter (Signed)
I don't see a read on it

## 2017-06-27 NOTE — Telephone Encounter (Signed)
Pt calling for mammogram results.  She is unable to see them on My Chart.  [phone]

## 2017-06-27 NOTE — Telephone Encounter (Signed)
Pt aware, via voicemail, radiologist has not read results. AMS will call her with results once report has been released

## 2017-06-28 ENCOUNTER — Other Ambulatory Visit: Payer: Self-pay | Admitting: *Deleted

## 2017-06-28 ENCOUNTER — Inpatient Hospital Stay
Admission: RE | Admit: 2017-06-28 | Discharge: 2017-06-28 | Disposition: A | Discharge status: Home / Self Care | Payer: Self-pay | Source: Ambulatory Visit | Attending: *Deleted | Admitting: *Deleted

## 2017-06-28 DIAGNOSIS — Z9289 Personal history of other medical treatment: Secondary | ICD-10-CM

## 2017-07-15 DIAGNOSIS — H8110 Benign paroxysmal vertigo, unspecified ear: Secondary | ICD-10-CM | POA: Diagnosis not present

## 2017-07-16 DIAGNOSIS — M545 Low back pain: Secondary | ICD-10-CM | POA: Diagnosis not present

## 2017-07-17 DIAGNOSIS — R42 Dizziness and giddiness: Secondary | ICD-10-CM | POA: Diagnosis not present

## 2017-07-23 DIAGNOSIS — M5416 Radiculopathy, lumbar region: Secondary | ICD-10-CM | POA: Diagnosis not present

## 2017-07-23 DIAGNOSIS — M545 Low back pain: Secondary | ICD-10-CM | POA: Diagnosis not present

## 2017-08-16 DIAGNOSIS — M533 Sacrococcygeal disorders, not elsewhere classified: Secondary | ICD-10-CM | POA: Diagnosis not present

## 2017-08-16 DIAGNOSIS — G8929 Other chronic pain: Secondary | ICD-10-CM | POA: Diagnosis not present

## 2017-08-21 DIAGNOSIS — M4608 Spinal enthesopathy, sacral and sacrococcygeal region: Secondary | ICD-10-CM | POA: Diagnosis not present

## 2017-08-21 DIAGNOSIS — M533 Sacrococcygeal disorders, not elsewhere classified: Secondary | ICD-10-CM | POA: Diagnosis not present

## 2017-08-21 DIAGNOSIS — M9904 Segmental and somatic dysfunction of sacral region: Secondary | ICD-10-CM | POA: Diagnosis not present

## 2017-08-22 ENCOUNTER — Encounter: Payer: Self-pay | Admitting: Obstetrics and Gynecology

## 2017-08-22 ENCOUNTER — Ambulatory Visit (INDEPENDENT_AMBULATORY_CARE_PROVIDER_SITE_OTHER): Payer: 59 | Admitting: Obstetrics and Gynecology

## 2017-08-22 VITALS — BP 120/76 | HR 66 | Ht 65.0 in | Wt 154.0 lb

## 2017-08-22 DIAGNOSIS — G8929 Other chronic pain: Secondary | ICD-10-CM

## 2017-08-22 DIAGNOSIS — M549 Dorsalgia, unspecified: Secondary | ICD-10-CM | POA: Diagnosis not present

## 2017-08-22 DIAGNOSIS — N898 Other specified noninflammatory disorders of vagina: Secondary | ICD-10-CM

## 2017-08-22 LAB — POCT WET PREP WITH KOH
CLUE CELLS WET PREP PER HPF POC: NEGATIVE
KOH PREP POC: NEGATIVE
Trichomonas, UA: NEGATIVE
YEAST WET PREP PER HPF POC: NEGATIVE

## 2017-08-22 NOTE — Progress Notes (Signed)
Chief Complaint  Patient presents with  . Gynecologic Exam    pelvic discomfort x3wks; also tailbone area pain, hemorrhoid rx doesn't help    HPI:      [name] is a 56 y.o. No obstetric history on file. who LMP was No LMP recorded. Patient has had a hysterectomy., presents today for 9 months of tailbone pain. Sx first started after her lap chole 1/18. She sat in a weird position for hours while working after surgery. She then developed tailbone pain that has persisted, and gotten worse since 7/18. She has seen FP and then  Ortho. She had neg xray and was given NSAIDs that helped some but sx persisted. She saw FP again and was given a steroid taper (last pill today) without relief. She saw chiropractor yesterday and sx worse last night. She sometimes has burning sensation in "her colon". She has tried anusol hemorrhoid crm without relief of burning sensation. She has a PT appt tomorrow and is sched to see GI 11/18. She has a hx of hemorrhoids and gastric issues. Sx worse with exercise. She tries to stand while doing job due to pain with sitting.  She has had a couple recent episodes of yellow vaginal d/c without odor/irritation/burning, although hard to differentiate vagina from rectum for pt. She is s/p TAH BSO for ovar cyst. No VB/spotting. She is not sex active. She has used prem vag crm in the past for vaginal dryness sx, but none currently.    Past Medical History:  Diagnosis Date  . Allergy   . Anxiety   . Asthma   . B12 deficiency   . Barrett esophagus   . Chronic atrophic gastritis   . DVT, lower extremity ([place])   . Gastric polyps   . HLD (hyperlipidemia)   . Hypothyroidism   . IGT (impaired glucose tolerance)   . PONV (postoperative nausea and vomiting)   . Vitamin D deficiency     Past Surgical History:  Procedure Laterality Date  . ABDOMINAL HYSTERECTOMY    . APPENDECTOMY    . CHOLECYSTECTOMY N/A 11/16/2016   Procedure: LAPAROSCOPIC CHOLECYSTECTOMY WITH  INTRAOPERATIVE CHOLANGIOGRAM;  Surgeon: Leonie Green, MD;  Location: [facility] ORS;  Service: General;  Laterality: N/A;  . DIAGNOSTIC LAPAROSCOPY    . ESOPHAGOGASTRODUODENOSCOPY    . TONSILLECTOMY    . VEIN LIGATION AND STRIPPING      Family History  Problem Relation Age of Onset  . Clotting disorder Mother   . Hyperlipidemia Father   . Breast cancer Neg Hx     Social History   Social History  . Marital status: Married    Spouse name: N/A  . Number of children: N/A  . Years of education: N/A   Occupational History  . Not on file.   Social History Main Topics  . Smoking status: Never Smoker  . Smokeless tobacco: Never Used  . Alcohol use No  . Drug use: No  . Sexual activity: No   Other Topics Concern  . Not on file   Social History Narrative  . No narrative on file     Current Outpatient Prescriptions:  .  cetirizine (ZYRTEC) 10 MG tablet, Take 10 mg by mouth daily., Disp: , Rfl:  .  clobetasol ointment (TEMOVATE) 3.41 %, Apply 1 application topically as needed., Disp: , Rfl:  .  Cyanocobalamin (RA VITAMIN B-12 TR) 1000 MCG TBCR, Take by mouth., Disp: , Rfl:  .  EPINEPHrine 0.3 mg/0.3 mL IJ  SOAJ injection, Inject into the muscle., Disp: , Rfl:  .  levothyroxine (SYNTHROID, LEVOTHROID) 75 MCG tablet, Take 1 tablet (75 mcg total) by mouth daily., Disp: 90 tablet, Rfl: 1 .  mometasone (ELOCON) 0.1 % ointment, Apply 1 application topically as needed., Disp: , Rfl:  .  Multiple Vitamin (MULTI-VITAMINS) TABS, Take 1 tablet by mouth daily. , Disp: , Rfl:    ROS:  Review of Systems  Constitutional: Negative for fever.  Gastrointestinal: Negative for blood in stool, constipation, diarrhea, nausea and vomiting.  Genitourinary: Positive for vaginal discharge. Negative for dyspareunia, dysuria, flank pain, frequency, hematuria, urgency, vaginal bleeding and vaginal pain.  Musculoskeletal: Positive for back pain.  Skin: Negative for rash.     OBJECTIVE:   Vitals:    BP 120/76   Pulse 66   Ht 5\' 5"  (1.651 m)   Wt 154 lb (69.9 kg)   BMI 25.63 kg/m   Physical Exam  Constitutional: She is oriented to person, place, and time and well-developed, well-nourished, and in no distress. Vital signs are normal.  Genitourinary: Right adnexa normal, left adnexa normal and vulva normal. Uterus is not tender. Cervix exhibits no tenderness. Right adnexum displays no mass and no tenderness. Left adnexum displays no mass and no tenderness. Vulva exhibits no erythema, no exudate, no lesion, no rash and no tenderness. Vagina exhibits no lesion. Thin  odorless  yellow and vaginal discharge found.  Genitourinary Comments: UTERUS/CX SURG ABSENT  Musculoskeletal:       Lumbar back: She exhibits decreased range of motion and pain. She exhibits no tenderness.  PT'S BENDING/MOVEMENT ALTERED DUE TO PAIN  Neurological: She is alert and oriented to person, place, and time.  Psychiatric: Memory, affect and judgment normal.  Vitals reviewed.   Results: Results for orders placed or performed in visit on 08/22/17 (from the past 24 hour(s))  POCT Wet Prep with KOH     Status: Normal   Collection Time: 08/22/17 11:28 AM  Result Value Ref Range   Trichomonas, UA Negative    Clue Cells Wet Prep HPF POC neg    Epithelial Wet Prep HPF POC  Few, Moderate, Many, Too numerous to count   Yeast Wet Prep HPF POC neg    Bacteria Wet Prep HPF POC  Few   RBC Wet Prep HPF POC     WBC Wet Prep HPF POC     KOH Prep POC Negative Negative     Assessment/Plan: Vaginal discharge - Neg wet prep. Reassurance.  - Plan: POCT Wet Prep with KOH  Other chronic back pain - At tailbone. Most likely musculoskeletal. Not GYN related.  Seeing PT tomorrow. Ice. F/u prn.     Return if symptoms worsen or fail to improve.  Pranathi Winfree B. Jahsiah Carpenter, PA-C 08/22/2017 11:33 AM

## 2017-08-23 DIAGNOSIS — K6289 Other specified diseases of anus and rectum: Secondary | ICD-10-CM | POA: Diagnosis not present

## 2017-08-23 DIAGNOSIS — K219 Gastro-esophageal reflux disease without esophagitis: Secondary | ICD-10-CM | POA: Diagnosis not present

## 2017-08-27 DIAGNOSIS — G8929 Other chronic pain: Secondary | ICD-10-CM | POA: Diagnosis not present

## 2017-08-27 DIAGNOSIS — M533 Sacrococcygeal disorders, not elsewhere classified: Secondary | ICD-10-CM | POA: Diagnosis not present

## 2017-08-30 DIAGNOSIS — R945 Abnormal results of liver function studies: Secondary | ICD-10-CM | POA: Diagnosis not present

## 2017-08-31 DIAGNOSIS — J301 Allergic rhinitis due to pollen: Secondary | ICD-10-CM | POA: Diagnosis not present

## 2017-09-09 DIAGNOSIS — M7521 Bicipital tendinitis, right shoulder: Secondary | ICD-10-CM | POA: Diagnosis not present

## 2017-09-10 DIAGNOSIS — J301 Allergic rhinitis due to pollen: Secondary | ICD-10-CM | POA: Diagnosis not present

## 2017-10-02 ENCOUNTER — Other Ambulatory Visit: Payer: Self-pay | Admitting: Obstetrics and Gynecology

## 2017-10-02 DIAGNOSIS — K5909 Other constipation: Secondary | ICD-10-CM | POA: Diagnosis not present

## 2017-10-02 DIAGNOSIS — K219 Gastro-esophageal reflux disease without esophagitis: Secondary | ICD-10-CM | POA: Diagnosis not present

## 2017-10-02 DIAGNOSIS — K2270 Barrett's esophagus without dysplasia: Secondary | ICD-10-CM | POA: Diagnosis not present

## 2017-10-02 NOTE — Telephone Encounter (Signed)
I did not write this that should go to her PCP

## 2017-10-13 ENCOUNTER — Ambulatory Visit
Admission: EM | Admit: 2017-10-13 | Discharge: 2017-10-13 | Disposition: A | Discharge status: Home / Self Care | Payer: 59 | Attending: Emergency Medicine | Admitting: Emergency Medicine

## 2017-10-13 ENCOUNTER — Encounter: Payer: Self-pay | Admitting: Emergency Medicine

## 2017-10-13 DIAGNOSIS — M26603 Bilateral temporomandibular joint disorder, unspecified: Secondary | ICD-10-CM

## 2017-10-13 DIAGNOSIS — M26609 Unspecified temporomandibular joint disorder, unspecified side: Secondary | ICD-10-CM

## 2017-10-13 MED ORDER — CELECOXIB 200 MG PO CAPS: 200.0000 mg | ORAL_CAPSULE | Freq: Two times a day (BID) | ORAL | 0 refills | Status: DC

## 2017-10-13 NOTE — ED Triage Notes (Signed)
Right ear pain and pressure for 4 days. Left ear pain since last night

## 2017-10-13 NOTE — Discharge Instructions (Signed)
If you are not improved in 2-3 weeks , see a dentist or TMJ specialist for further evaluation and treatment

## 2017-10-13 NOTE — ED Provider Notes (Addendum)
[facility]    CSN: [id number] Arrival date & time: 10/13/17  1039     History   Chief Complaint Chief Complaint  Patient presents with  . Otalgia    HPI Dana Hensley is a 56 y.o. female.   HPI  This a 56 year old female who presents with a four-day history of right ear pain and pressure that she has noticed mostly with chewing. She also has a feeling of grinding and also has a grinding noise when she chews. Her left ear started the same symptoms last night. She indicates the area immediately anterior to the tragus as the place she feels most of her discomfort. She's had no locking of her jaw. He said no fever or chills. She states that she has been eating apple chips which are very chewy.        Past Medical History:  Diagnosis Date  . Allergy   . Anxiety   . Asthma   . B12 deficiency   . Barrett esophagus   . Chronic atrophic gastritis   . DVT, lower extremity ([place])   . Gastric polyps   . HLD (hyperlipidemia)   . Hypothyroidism   . IGT (impaired glucose tolerance)   . PONV (postoperative nausea and vomiting)   . Vitamin D deficiency     Patient Active Problem List   Diagnosis Date Noted  . Pain in limb 12/18/2016  . Chronic venous insufficiency 10/16/2016  . Varicose veins of both lower extremities with pain 10/16/2016  . Lymphedema 10/16/2016  . Barrett's esophagus determined by endoscopy 01/25/2016  . Vitamin B12 deficiency 10/18/2015  . Chronic neck and back pain 08/19/2015  . Body odor 07/09/2015  . GAD (generalized anxiety disorder) 07/09/2015  . Allergic rhinitis with postnasal drip 07/09/2015  . Dyslipidemia 05/05/2015  . Acquired hypothyroidism 05/05/2015  . History of malignant carcinoid tumor of stomach 03/26/2012    Past Surgical History:  Procedure Laterality Date  . ABDOMINAL HYSTERECTOMY    . APPENDECTOMY    . DIAGNOSTIC LAPAROSCOPY    . ESOPHAGOGASTRODUODENOSCOPY    . LAPAROSCOPIC CHOLECYSTECTOMY WITH INTRAOPERATIVE  CHOLANGIOGRAM N/A 11/16/2016   Performed by Leonie Green, MD at [facility] ORS  . TONSILLECTOMY    . VEIN LIGATION AND STRIPPING      OB History    Gravida Para Term Preterm AB Living   1 1 1     1    SAB TAB Ectopic Multiple Live Births           1       Home Medications    Prior to Admission medications   Medication Sig Start Date End Date Taking? Authorizing Provider  cetirizine (ZYRTEC) 10 MG tablet Take 10 mg by mouth daily.   Yes [provider]  clobetasol ointment (TEMOVATE) 5.85 % Apply 1 application topically as needed.   Yes [provider]  Cyanocobalamin (RA VITAMIN B-12 TR) 1000 MCG TBCR Take by mouth.   Yes [provider]  EPINEPHrine 0.3 mg/0.3 mL IJ SOAJ injection Inject into the muscle. 03/27/17  Yes [provider]  levothyroxine (SYNTHROID, LEVOTHROID) 75 MCG tablet Take 1 tablet (75 mcg total) by mouth daily. 03/15/16  Yes Lada, Satira Anis, MD  mometasone (ELOCON) 0.1 % ointment Apply 1 application topically as needed.   Yes [provider]  Multiple Vitamin (MULTI-VITAMINS) TABS Take 1 tablet by mouth daily.  10/07/04  Yes [provider]  celecoxib (CELEBREX) 200 MG capsule Take 1 capsule (200  mg total) 2 (two) times daily by mouth. Take with food 10/13/17   Lorin Picket, PA-C    Family History Family History  Problem Relation Age of Onset  . Clotting disorder Mother   . Hyperlipidemia Father   . Breast cancer Neg Hx     Social History Social History   Tobacco Use  . Smoking status: Never Smoker  . Smokeless tobacco: Never Used  Substance Use Topics  . Alcohol use: No    Alcohol/week: 0.0 oz  . Drug use: No     Allergies   Latex; Neomycin; Neosporin  [neomycin-bacitracin zn-polymyx]; Neomycin-polymyxin-gramicidin; and Other   Review of Systems Review of Systems  Constitutional: Negative for activity change, appetite change, chills, fatigue and fever.  HENT: Positive for ear pain.     Respiratory: Negative for cough and shortness of breath.   All other systems reviewed and are negative.    Physical Exam Triage Vital Signs ED Triage Vitals  Enc Vitals Group     BP 10/13/17 1050 116/79     Pulse Rate 10/13/17 1050 64     Resp 10/13/17 1050 18     Temp 10/13/17 1050 98.2 F (36.8 C)     Temp Source 10/13/17 1050 Oral     SpO2 10/13/17 1050 100 %     Weight 10/13/17 1052 155 lb (70.3 kg)     Height 10/13/17 1052 5\' 5"  (1.651 m)     Head Circumference --      Peak Flow --      Pain Score 10/13/17 1052 5     Pain Loc --      Pain Edu? --      Excl. in [place]? --    No data found.  Updated Vital Signs BP 116/79 (BP Location: Left Arm)   Pulse 64   Temp 98.2 F (36.8 C) (Oral)   Resp 18   Ht 5\' 5"  (1.651 m)   Wt 155 lb (70.3 kg)   SpO2 100%   BMI 25.79 kg/m   Visual Acuity Right Eye Distance:   Left Eye Distance:   Bilateral Distance:    Right Eye Near:   Left Eye Near:    Bilateral Near:     Physical Exam  Constitutional: She is oriented to person, place, and time. She appears well-developed and well-nourished. No distress.  HENT:  Head: Normocephalic.  Right Ear: External ear normal.  Left Ear: External ear normal.  Nose: Nose normal.  Mouth/Throat: Oropharynx is clear and moist. No oropharyngeal exudate.  Examination of the right ear shows a normal TM. There is no discomfort with movement of the tragus. Pressure applied over the right TM J with mastication does reproduce her symptoms of pain. There is mild crepitus present as well. He has less discomfort over the left TMJ. He has no locking of the jaw. She is able able to open fully and does not demonstrate any trismus.  Eyes: Pupils are equal, round, and reactive to light.  Neck: Normal range of motion. Neck supple.  Musculoskeletal: Normal range of motion.  Neurological: She is alert and oriented to person, place, and time. No cranial nerve deficit.  Skin: Skin is warm and dry. She is not  diaphoretic.  Psychiatric: She has a normal mood and affect. Her behavior is normal. Judgment and thought content normal.  Nursing note and vitals reviewed.    UC Treatments / Results  Labs (all labs ordered are listed, but only abnormal results are  displayed) Labs Reviewed - No data to display  EKG  EKG Interpretation None       Radiology No results found.  Procedures Procedures (including critical care time)  Medications Ordered in UC Medications - No data to display   Initial Impression / Assessment and Plan / UC Course  I have reviewed the triage vital signs and the nursing notes.  Pertinent labs & imaging results that were available during my care of the patient were reviewed by me and considered in my medical decision making (see chart for details).     Plan: 1. Test/x-ray results and diagnosis reviewed with patient 2. rx as per orders; risks, benefits, potential side effects reviewed with patient 3. Recommend supportive treatment with use of 5 soft foods until the pain subsides in a gradually increase as tolerated. We'll start her on cold compresses 20 minutes every 2 hours  4-5 times daily. Massaging the area may also prove beneficial. As per previous GI problems with Barrett's esophagitis etc. I will start her on Celebrex for its Cox 2 profile. She is To take it with food. If she is not improving she should follow-up with a dentist or TMJ specialist further evaluation and care. 4. F/u prn if symptoms worsen or don't improve   Final Clinical Impressions(s) / UC Diagnoses   Final diagnoses:  TMJ (temporomandibular joint syndrome)    ED Discharge Orders        Ordered    celecoxib (CELEBREX) 200 MG capsule  2 times daily     10/13/17 1145       Controlled Substance Prescriptions [place] Controlled Substance Registry consulted? Not Applicable   Lorin Picket, PA-C 10/13/17 1211    Lorin Picket, PA-C 10/13/17 1212

## 2017-10-24 ENCOUNTER — Other Ambulatory Visit: Payer: Self-pay

## 2017-10-24 ENCOUNTER — Encounter: Payer: Self-pay | Admitting: Physical Therapy

## 2017-10-24 ENCOUNTER — Ambulatory Visit: Payer: 59 | Attending: Nurse Practitioner | Admitting: Physical Therapy

## 2017-10-24 DIAGNOSIS — M791 Myalgia, unspecified site: Secondary | ICD-10-CM

## 2017-10-24 DIAGNOSIS — M533 Sacrococcygeal disorders, not elsewhere classified: Secondary | ICD-10-CM | POA: Insufficient documentation

## 2017-10-24 DIAGNOSIS — R29898 Other symptoms and signs involving the musculoskeletal system: Secondary | ICD-10-CM | POA: Diagnosis not present

## 2017-10-24 DIAGNOSIS — R2689 Other abnormalities of gait and mobility: Secondary | ICD-10-CM | POA: Diagnosis not present

## 2017-10-24 NOTE — Therapy (Signed)
[place] MAIN [facility] SERVICES [address] [place], [place], [address] Phone: [phone]   Fax:  [phone]  Physical Therapy Evaluation  Patient Details  Name: Dana Hensley MRN: [id number] Date of Birth: [birth year] Referring Provider: Gaetano Net, NP    Encounter Date: 10/24/2017  PT End of Session - 10/24/17 1627    Visit Number  1    Number of Visits  12    Date for PT Re-Evaluation  01/16/18    PT Start Time  1508    PT Stop Time  1630    PT Time Calculation (min)  82 min       Past Medical History:  Diagnosis Date  . Allergy   . Anxiety   . Asthma   . B12 deficiency   . Barrett esophagus   . Chronic atrophic gastritis   . DVT, lower extremity ([place])   . Gastric polyps   . HLD (hyperlipidemia)   . Hypothyroidism   . IGT (impaired glucose tolerance)   . PONV (postoperative nausea and vomiting)   . Vitamin D deficiency     Past Surgical History:  Procedure Laterality Date  . ABDOMINAL HYSTERECTOMY    . APPENDECTOMY    . CHOLECYSTECTOMY N/A 11/16/2016   Procedure: LAPAROSCOPIC CHOLECYSTECTOMY WITH INTRAOPERATIVE CHOLANGIOGRAM;  Surgeon: Leonie Green, MD;  Location: [facility] ORS;  Service: General;  Laterality: N/A;  . DIAGNOSTIC LAPAROSCOPY    . ESOPHAGOGASTRODUODENOSCOPY    . TONSILLECTOMY    . VEIN LIGATION AND STRIPPING      There were no vitals filed for this visit.   Subjective Assessment - 10/24/17 1513    Subjective 1) Tailbone pain:  Pt reports tailbone pain that started 1 year ago after her gall bladder removal surgery in Nov 17, 2016. Pt was in a reclined position on her couch working on her laptop and then she felt her butt was numb and the pain occured more when she stood up. The pain also occurred when she came out of her car with standing up after driving long periods > 1 hr. The pain lasts than < 1 min. The pain intensity was 6-8/10 at this time. The pain feels like  "pulling down inside" " some  strange feeling deep inside" as pt points to the groin and pelvic area.  Between July -Sept 2018 the pain caused pt to not be able to sit for short periods. Pt has been prescribed steroids and prednisone and the pain became less to 2-4/10.   Pt also took pain medications for R shoulder pain ( bicep) which also helped the tailbone pain. Now pt is able to sit longer for 1 hour without any problems and only feels an annoying pain at 2/10. The pain occurs at the end of a day after she has been lifting her grandson  ( 28 year old) or  when she is constipated. Pt reports she has been seen by a gynecologist who told her everything was fine and recommended her to see an orthopedist. Pt was seen by a PT who referred to a pelvic health PT.    2) Constipation:  Pt has been more constipated starting middle August (4 months ago) .  Pt has been taking fiber, Miralax, and prunes as prescribed by her GI doctor.  Bowel movements now occur 2x/ day.   She saw two GI doctors for the constipation and was told to get a colonoscopy due to blood in her stool.  Pt  has had hemorrhoids before. Pt had bleeding in her stools 5 years ago but her colonoscopy at that time showed nothing. Pt notices hemorrhoids with wiping and with lifting something.Pt will be seeing her GI doctor in [place] in Jan 2019 who has recommended her to get a colonoscopy  3)  Neck and shoulder muscle pain has been a problem for the past 10 years. Chiroporactic Tx has not helped. The pain is worst with computer work. 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 pain is 5-6/10 everyday.  4) Pt recently visited ER 10/13/17 for R sided pain 506/10 by her ear after chewing apple chips. The ER doctor told her to take pain medicine but pt has not taken it. Pt states she is missing two teeth on L side. Pt chews mostly on the R side.  Currently pain is 2-3/10.       Pertinent History  Gyn Hx: abdominal hysterectomy.  Additional surgery: appendix and gall  bladder removal. Denied urinary leakage nor low back pain.    Patient Stated Goals  less pain    Currently in Pain?  Yes    Pain Score  2     Pain Location  Coccyx         [facility] PT Assessment - 10/24/17 1600      Assessment   Medical Diagnosis  pelvic dysfunction    Referring Provider  Gaetano Net, NP       Precautions   Precautions  None      Restrictions   Weight Bearing Restrictions  No      Balance Screen   Has the patient fallen in the past 6 months  No      Observation/Other Assessments   Observations  ankles crossed, slumped, posterior tilt of pelvis       Coordination   Gross Motor Movements are Fluid and Coordinated  -- chest breathing      AROM   Overall AROM Comments  limited trunk rotation, sidebend B       Palpation   Spinal mobility  tenderness over upper /midtrap and hypomobility at thoracic     SI assessment   R ASIS more anterior, SIJ more hypomobile     Palpation comment  tenderness at coccgyeus B , deviation of coccyx to R, tensions and tenderness at L proximal L coccgeus, hypomobility at proximal SIJ R    abdominal scar restrictions R LQ, umbilicus, B upper Q       Bed Mobility   Bed Mobility  -- sit> supine w/ pain    Sit to Supine  -- no pain with cue for sit to sidelying to supine             Objective measurements completed on examination: See above findings.      [place] Adult PT Treatment/Exercise - 10/24/17 1600      Exercises   Exercises  -- see pt instructions      Moist Heat Therapy   Number Minutes Moist Heat  5 Minutes    Moist Heat Location  Other (comment) buttocks      Manual Therapy   Manual therapy comments  superior mob at sacrum, MWM hip abd, hip ext on R,  STM at ischial tuberosity / coccgeus L,               PT Education - 10/24/17 [place]    Education provided  Yes    Education Details  POC, anatomy. physiology, HEP, goals    Person(s) Educated  Patient    Methods  Explanation;Demonstration;Tactile  cues;Verbal cues;Handout    Comprehension  Returned demonstration;Verbalized understanding;Verbal cues required;Tactile cues required          PT Long Term Goals - 10/24/17 1623      PT LONG TERM GOAL #1   Title  Pt will decrease PDI 24% to <19  % in order to return to ADLs without tailbone pain    Time  12    Period  Weeks    Status  New    Target Date  01/16/18      PT LONG TERM GOAL #2   Title  PT will decrease her NDI score 30% to <15 % in order to work without neck and shoulder pain     Time  12    Period  Weeks    Status  New    Target Date  01/16/18      PT LONG TERM GOAL #3   Title  Pt will demo no coccyx deviation to R,  no tenderness/tensions at coccygeus, and more symmetrically aligned ASIS with increased R SIJ mobility  across 2 visits in order to sit and lift her grandson     Time  2    Period  Weeks    Status  New    Target Date  11/07/17      PT LONG TERM GOAL #4   Title  Pt will demo decreased scar restrictions over mutliple abdominal scars in order to improve deep core coordination. ROM, and strength for motility and pelvic floor function , less constipation episodes     Time  8    Period  Weeks    Status  New    Target Date  12/19/17             Plan - 10/24/17 1653    Clinical Impression Statement  Pt is a 56 yo female who complains of tailbone pain, constipation, and neck/shoulders pain. These deficits impact her ability to sit to stand, lift her grandson, and to work at the computer. Pt 's clinical presentations include coccyx devation to R, pelvic obliquties, pelvic floor tenderness/ tightness on L > R, abdominal scar restrictions, limited spinal and sacroiliac joint mobility, increased shoulder mm tensions, and poor body mechanics.  Following Tx today which corrected her pelvic obliquties, coccyx deviation, and decreased her pelvic floor tensions, pt reported her pain decreased from 3-4/10 to 2-3/10 and was more comfrotable sitting.     History  and Personal Factors relevant to plan of care:  Gyn Hx: abdominal hysterectomy.  Additional surgery: appendix and gall bladder removal. Denied urinary leakage nor low back pain.   Clinical Presentation  Evolving    Clinical Decision Making  Moderate    Rehab Potential  Good    PT Frequency  1x / week    PT Duration  12 weeks    PT Treatment/Interventions  Therapeutic exercise;Therapeutic activities;Functional mobility training;Patient/family education;Balance training;Neuromuscular re-education;Manual techniques;Scar mobilization;Passive range of motion;Moist Heat    Consulted and Agree with Plan of Care  Patient       Patient will benefit from skilled therapeutic intervention in order to improve the following deficits and impairments:  Increased fascial restricitons, Decreased range of motion, Decreased endurance, Decreased safety awareness, Increased muscle spasms, Decreased activity tolerance, Pain, Hypomobility, Impaired flexibility, Decreased scar mobility, Postural dysfunction, Decreased mobility, Decreased coordination  Visit Diagnosis: Sacrococcygeal disorders, not elsewhere classified  Other symptoms and signs involving the musculoskeletal system  Other abnormalities of gait and mobility  Myalgia     Problem List Patient Active Problem List   Diagnosis Date Noted  . Pain in limb 12/18/2016  . Chronic venous insufficiency 10/16/2016  . Varicose veins of both lower extremities with pain 10/16/2016  . Lymphedema 10/16/2016  . Barrett's esophagus determined by endoscopy 01/25/2016  . Vitamin B12 deficiency 10/18/2015  . Chronic neck and back pain 08/19/2015  . Body odor 07/09/2015  . GAD (generalized anxiety disorder) 07/09/2015  . Allergic rhinitis with postnasal drip 07/09/2015  . Dyslipidemia 05/05/2015  . Acquired hypothyroidism 05/05/2015  . History of malignant carcinoid tumor of stomach 03/26/2012    Jerl Mina ,PT, DPT, E-RYT  10/24/2017, 5:51  PM  [place] MAIN [facility] SERVICES [address] [place], [place], [address] Phone: [phone]   Fax:  [phone]  Name: Dana Hensley MRN: [id number] Date of Birth: 04-21-1961

## 2017-10-24 NOTE — Patient Instructions (Addendum)
To decrease tailbone pain and coccygeus tightness   Alternate every 5 reps   ( 5 sets of frog for 5, prone heel press for 5)   Frog stretch: laying on belly with pillow under hips, knees bent, inhale do nothing, exhale let ankles fall apart     Prone Heel Press for strengthening sacro-iliac joints  1. Lie on your belly. If you have an arch in your low back or it feels umcomfortable, place a pillow under your low belly/hips to make sure your low back feel comfortable.   2. Place our forehead on top of your palms.      Widen your knees apart for starting position.   3. Inhale, feel belly and low back expand  4. Exhale, feel belly hug in, press heel together and count aloud for 5 sec. Then relax the heel squeezing.  Perform 10 reps of 5 sec holds. 2 sets/ day.    If you feel entire buttock tighten too much or feel low back pain, apply 50% less effort. As you press your heel together, you will feel as if your pubic bone (front of your pelvis) and sacrum (back of your pelvis) gentle move towards each other or your low abdominal muscles hug in more.       _________   To decrease midback / shoulder tightness   Open book exercise ( handout)   __________ childs pose rocking    10 reps

## 2017-10-31 DIAGNOSIS — E782 Mixed hyperlipidemia: Secondary | ICD-10-CM | POA: Diagnosis not present

## 2017-10-31 DIAGNOSIS — E039 Hypothyroidism, unspecified: Secondary | ICD-10-CM | POA: Diagnosis not present

## 2017-10-31 DIAGNOSIS — Z0000 Encounter for general adult medical examination without abnormal findings: Secondary | ICD-10-CM | POA: Diagnosis not present

## 2017-11-02 DIAGNOSIS — R945 Abnormal results of liver function studies: Secondary | ICD-10-CM | POA: Diagnosis not present

## 2017-11-02 DIAGNOSIS — R7302 Impaired glucose tolerance (oral): Secondary | ICD-10-CM | POA: Diagnosis not present

## 2017-11-02 DIAGNOSIS — Z0000 Encounter for general adult medical examination without abnormal findings: Secondary | ICD-10-CM | POA: Diagnosis not present

## 2017-11-02 DIAGNOSIS — E039 Hypothyroidism, unspecified: Secondary | ICD-10-CM | POA: Diagnosis not present

## 2017-11-02 DIAGNOSIS — E782 Mixed hyperlipidemia: Secondary | ICD-10-CM | POA: Diagnosis not present

## 2017-11-02 DIAGNOSIS — Z79899 Other long term (current) drug therapy: Secondary | ICD-10-CM | POA: Diagnosis not present

## 2017-11-05 ENCOUNTER — Encounter: Payer: 59 | Admitting: Physical Therapy

## 2017-11-13 ENCOUNTER — Ambulatory Visit: Payer: 59 | Attending: Nurse Practitioner | Admitting: Physical Therapy

## 2017-11-13 DIAGNOSIS — M791 Myalgia, unspecified site: Secondary | ICD-10-CM

## 2017-11-13 DIAGNOSIS — R2689 Other abnormalities of gait and mobility: Secondary | ICD-10-CM

## 2017-11-13 DIAGNOSIS — M533 Sacrococcygeal disorders, not elsewhere classified: Secondary | ICD-10-CM | POA: Diagnosis not present

## 2017-11-13 DIAGNOSIS — R29898 Other symptoms and signs involving the musculoskeletal system: Secondary | ICD-10-CM | POA: Insufficient documentation

## 2017-11-13 NOTE — Patient Instructions (Addendum)
Handout on ergonomic workstation    Feet on the ground when sitting   Out of chair with feet wider, than hips   ______ Strengthening gluts   Between work breaks very 1 hour: Minisquat: Scoot buttocks back slight, hinge like you are looking at your reflection on a pond  Knees behind toes,  Inhale to "smell flowers" Exhale on the rise "like rocket"  Do not lock knees, have more weight across ballmounds of feet, toes relaxed  20 reps x 3 x day    Bridging Position:  Elbows straight ,  knees hip width apart, heels under knees,   Inhale , do nothing, exhale, lift buttocks up without wobblyness of pelvis while pressing arms and shoulders, feet into floor/ bed   10 x 2 sets      ________   Add to pelvic floor stretch:  Stretch for pelvic floor   "v heels slide away and then back toward buttocks and then rock knee to slight ,  slide heel along at 11 o clock away from buttocks   10 reps    ________   handout to deep core 1-2

## 2017-11-13 NOTE — Therapy (Signed)
[place] MAIN [facility] SERVICES [address] [place], [place], [address] Phone: [phone]   Fax:  [phone]  Physical Therapy Treatment  Patient Details  Name: Dana Hensley MRN: [id number] Date of Birth: [birth year] Referring Provider: Gaetano Net, NP    Encounter Date: 11/13/2017  PT End of Session - 11/13/17 1622    Visit Number  2    Number of Visits  12    Date for PT Re-Evaluation  01/16/18    PT Start Time  1017    PT Stop Time  1655    PT Time Calculation (min)  50 min       Past Medical History:  Diagnosis Date  . Allergy   . Anxiety   . Asthma   . B12 deficiency   . Barrett esophagus   . Chronic atrophic gastritis   . DVT, lower extremity ([place])   . Gastric polyps   . HLD (hyperlipidemia)   . Hypothyroidism   . IGT (impaired glucose tolerance)   . PONV (postoperative nausea and vomiting)   . Vitamin D deficiency     Past Surgical History:  Procedure Laterality Date  . ABDOMINAL HYSTERECTOMY    . APPENDECTOMY    . CHOLECYSTECTOMY N/A 11/16/2016   Procedure: LAPAROSCOPIC CHOLECYSTECTOMY WITH INTRAOPERATIVE CHOLANGIOGRAM;  Surgeon: Leonie Green, MD;  Location: [facility] ORS;  Service: General;  Laterality: N/A;  . DIAGNOSTIC LAPAROSCOPY    . ESOPHAGOGASTRODUODENOSCOPY    . TONSILLECTOMY    . VEIN LIGATION AND STRIPPING      There were no vitals filed for this visit.  Subjective Assessment - 11/13/17 1609    Subjective  Pt reports 50% improvement with her tailbone pain. Pt has been able to sit almost the whole day. The remaining pain occurs when she gets up from sitting. The day after last session, pt woke up with increased tailbone pain but she started doing her exercises which helped to decrease the pain and kept her from restoring to go to the Urgent Care.     Pertinent History  Gyn Hx: abdominal hysterectomy.  Additional surgery: appendix and gall bladder removal. Denied urinary leakage nor low back pain.  pt reports she has been seen by a gynecologist who told her everything was fine and recommende her to see an orthopedist. She also saw two GI doctors for the constipation and was told to get a colonoscopy due to blood in her stool.  Pt has had hemorrhoids before. Pt had bleeding in her stools 5 years ago which a colonscopy then showed nothing. Pt notices hemorrhoids with wiping and with lifting something.  Pt also has seen a PT at [facility] and she refered pt to a specialized PT ebcause she suspect that pt's problems were related to the pelvic floor.      Patient Stated Goals  less pain         [facility] PT Assessment - 11/13/17 1615      Sit to Stand   Comments  narrow BOS, after cues for wider BOS, less pain       Strength   Overall Strength Comments  hip ext B 4-/5, posterior sling with scaption UE L/R hip ext with lumbopelvic pertubations but 4/5. Opposite no weakness/ perturbations       Palpation   SI assessment   PSIS and coccyx aligned. tenderness at coccygeus attachment to sacrum B  Pelvic Floor Special Questions - 11/13/17 1646    Pelvic Floor Internal Exam  pt consented verbally without contraindications after being explained details to exam     Exam Type  Vaginal    Palpation  increased perineal scar at 1st layer 3-5 o clock and at ischoanal fossa     Strength  fair squeeze, definite lift        [facility] Adult PT Treatment/Exercise - 11/13/17 0001      Exercises   Exercises  -- see pt instructions       Manual Therapy   Internal Pelvic Floor  STM perineal scar at 1st layer 3-5 o clock and at externally at ischoanal fossa             PT Education - 11/13/17 1622    Education provided  Yes    Education Details  HEP     Person(s) Educated  Patient    Methods  Explanation;Demonstration;Tactile cues;Verbal cues;Handout    Comprehension  Returned demonstration;Verbalized understanding          PT Long Term Goals - 11/13/17 1655      PT LONG  TERM GOAL #1   Title  Pt will decrease PDI 24% to <19  % in order to return to ADLs without tailbone pain    Time  12    Period  Weeks    Status  On-going      PT LONG TERM GOAL #2   Title  PT will decrease her NDI score 30% to <15 % in order to work without neck and shoulder pain     Time  12    Period  Weeks    Status  On-going      PT LONG TERM GOAL #3   Title  Pt will demo no coccyx deviation to R,  no tenderness/tensions at coccygeus, and more symmetrically aligned ASIS with increased R SIJ mobility  across 2 visits in order to sit and lift her grandson     Time  2    Period  Weeks    Status  Achieved      PT LONG TERM GOAL #4   Title  Pt will demo decreased scar restrictions over mutliple abdominal scars in order to improve deep core coordination. ROM, and strength for motility and pelvic floor function , less constipation episodes     Time  8    Period  Weeks    Status  On-going      PT LONG TERM GOAL #5   Title  Pt will increase hip ext stength B to 5/5 and decreased perineal scar restriction in order to perform sit to stand with no pain     Time  4    Period  Weeks    Status  New    Target Date  12/11/17            Plan - 11/13/17 1651    Clinical Impression Statement  Pt is progresswell after the first visit with report of 50% improvement and able to sit for longer periods. Pt showed no more coccyx deviation nor pelvic obliquities and significantly less coccygeus mm tensions. Address perineal scar restrictions on L posterior pelvic floor with internal and external manual Tx. Advanced pt to glut strengthening and deep core strengthening to improve strength in these areas. Pt continues to benefit from skilled PT.      Rehab Potential  Good    PT Frequency  1x / week  PT Duration  12 weeks    PT Treatment/Interventions  Therapeutic exercise;Therapeutic activities;Functional mobility training;Patient/family education;Balance training;Neuromuscular  re-education;Manual techniques;Scar mobilization;Passive range of motion;Moist Heat    Consulted and Agree with Plan of Care  Patient       Patient will benefit from skilled therapeutic intervention in order to improve the following deficits and impairments:  Increased fascial restricitons, Decreased range of motion, Decreased endurance, Decreased safety awareness, Increased muscle spasms, Decreased activity tolerance, Pain, Hypomobility, Impaired flexibility, Decreased scar mobility, Postural dysfunction, Decreased mobility, Decreased coordination  Visit Diagnosis: Sacrococcygeal disorders, not elsewhere classified  Other symptoms and signs involving the musculoskeletal system  Other abnormalities of gait and mobility  Myalgia     Problem List Patient Active Problem List   Diagnosis Date Noted  . Pain in limb 12/18/2016  . Chronic venous insufficiency 10/16/2016  . Varicose veins of both lower extremities with pain 10/16/2016  . Lymphedema 10/16/2016  . Barrett's esophagus determined by endoscopy 01/25/2016  . Vitamin B12 deficiency 10/18/2015  . Chronic neck and back pain 08/19/2015  . Body odor 07/09/2015  . GAD (generalized anxiety disorder) 07/09/2015  . Allergic rhinitis with postnasal drip 07/09/2015  . Dyslipidemia 05/05/2015  . Acquired hypothyroidism 05/05/2015  . History of malignant carcinoid tumor of stomach 03/26/2012    Jerl Mina ,PT, DPT, E-RYT  11/13/2017, 4:56 PM  [place] MAIN [facility] SERVICES [address] [place], [place], [address] Phone: [phone]   Fax:  [phone]  Name: Dana Hensley MRN: [id number] Date of Birth: 01/28/1961

## 2017-11-15 DIAGNOSIS — J309 Allergic rhinitis, unspecified: Secondary | ICD-10-CM | POA: Diagnosis not present

## 2017-11-15 DIAGNOSIS — R0982 Postnasal drip: Secondary | ICD-10-CM | POA: Diagnosis not present

## 2017-11-21 ENCOUNTER — Ambulatory Visit: Payer: 59 | Admitting: Physical Therapy

## 2017-11-21 DIAGNOSIS — M533 Sacrococcygeal disorders, not elsewhere classified: Secondary | ICD-10-CM

## 2017-11-21 DIAGNOSIS — M791 Myalgia, unspecified site: Secondary | ICD-10-CM

## 2017-11-21 DIAGNOSIS — R2689 Other abnormalities of gait and mobility: Secondary | ICD-10-CM

## 2017-11-21 DIAGNOSIS — R29898 Other symptoms and signs involving the musculoskeletal system: Secondary | ICD-10-CM

## 2017-11-21 NOTE — Therapy (Signed)
[place] MAIN [facility] SERVICES [address] [place], [place], [address] Phone: [phone]   Fax:  [phone]  Physical Therapy Treatment  Patient Details  Name: Dana Hensley MRN: [id number] Date of Birth: 09/01/1961 Referring Provider: Gaetano Net, NP    Encounter Date: 11/21/2017  PT End of Session - 11/21/17 1616    Visit Number  3    Number of Visits  12    Date for PT Re-Evaluation  01/16/18    PT Start Time  1610    PT Stop Time  1700    PT Time Calculation (min)  50 min    Activity Tolerance  Patient tolerated treatment well    Behavior During Therapy  [facility] for tasks assessed/performed       Past Medical History:  Diagnosis Date  . Allergy   . Anxiety   . Asthma   . B12 deficiency   . Barrett esophagus   . Chronic atrophic gastritis   . DVT, lower extremity ([place])   . Gastric polyps   . HLD (hyperlipidemia)   . Hypothyroidism   . IGT (impaired glucose tolerance)   . PONV (postoperative nausea and vomiting)   . Vitamin D deficiency     Past Surgical History:  Procedure Laterality Date  . ABDOMINAL HYSTERECTOMY    . APPENDECTOMY    . CHOLECYSTECTOMY N/A 11/16/2016   Procedure: LAPAROSCOPIC CHOLECYSTECTOMY WITH INTRAOPERATIVE CHOLANGIOGRAM;  Surgeon: Leonie Green, MD;  Location: [facility] ORS;  Service: General;  Laterality: N/A;  . DIAGNOSTIC LAPAROSCOPY    . ESOPHAGOGASTRODUODENOSCOPY    . TONSILLECTOMY    . VEIN LIGATION AND STRIPPING      There were no vitals filed for this visit.  Subjective Assessment - 11/21/17 1614    Subjective  Pt reports she felt her tailbone was much worse than before since last session from 3-4/ 10 to a 4/10.     Pertinent History  Gyn Hx: abdominal hysterectomy.  Additional surgery: appendix and gall bladder removal. Denied urinary leakage nor low back pain. pt reports she has been seen by a gynecologist who told her everything was fine and recommende her to see an orthopedist. She  also saw two GI doctors for the constipation and was told to get a colonoscopy due to blood in her stool.  Pt has had hemorrhoids before. Pt had bleeding in her stools 5 years ago which a colonscopy then showed nothing. Pt notices hemorrhoids with wiping and with lifting something.  Pt also has seen a PT at [facility] and she refered pt to a specialized PT ebcause she suspect that pt's problems were related to the pelvic floor.      Patient Stated Goals  less pain         [facility] PT Assessment - 11/21/17 1646      Observation/Other Assessments   Observations  ASIS to medial malleoli : R 1cm longer      Palpation   Spinal mobility  upper thoracolumbar curve (convex to R) , increased QL mm bulk on L compared to R, slight R trunk rotation      SI assessment   slight coccyx to the R , significantly less tenderness at coccygeus.   R ASIS lower than L                    [facility] Adult PT Treatment/Exercise - 11/21/17 1648      Exercises   Exercises  -- see pt  instructions       Moist Heat Therapy   Number Minutes Moist Heat  5 Minutes    Moist Heat Location  -- thoracic/ lumbar spine       Manual Therapy   Manual therapy comments  distraction at upper thoracic , STM along R medial scapular with MWM shoudler abduction on R, trunk rotation B ,  rotational mob at R hip in sidelying              PT Education - 11/21/17 1659    Education provided  Yes    Education Details  HEP    Person(s) Educated  Patient    Methods  Explanation;Demonstration;Tactile cues;Verbal cues;Handout    Comprehension  Returned demonstration;Verbalized understanding          PT Long Term Goals - 11/13/17 1655      PT LONG TERM GOAL #1   Title  Pt will decrease PDI 24% to <19  % in order to return to ADLs without tailbone pain    Time  12    Period  Weeks    Status  On-going      PT LONG TERM GOAL #2   Title  PT will decrease her NDI score 30% to <15 % in order to work without neck and  shoulder pain     Time  12    Period  Weeks    Status  On-going      PT LONG TERM GOAL #3   Title  Pt will demo no coccyx deviation to R,  no tenderness/tensions at coccygeus, and more symmetrically aligned ASIS with increased R SIJ mobility  across 2 visits in order to sit and lift her grandson     Time  2    Period  Weeks    Status  Achieved      PT LONG TERM GOAL #4   Title  Pt will demo decreased scar restrictions over mutliple abdominal scars in order to improve deep core coordination. ROM, and strength for motility and pelvic floor function , less constipation episodes     Time  8    Period  Weeks    Status  On-going      PT LONG TERM GOAL #5   Title  Pt will increase hip ext stength B to 5/5 and decreased perineal scar restriction in order to perform sit to stand with no pain     Time  4    Period  Weeks    Status  New    Target Date  12/11/17            Plan - 11/21/17 1700    Clinical Impression Statement  Pt's R thoracolumbar curves were addressed today with manual Tx and therapeutic exercise. Pt's coccyx deviation is likely related to her scoliosis spine and leg length differences. Pt reported no pain in her back/ neck shoulders and tailbone area following Tx and with shoe lift in L shoe to account for shorter length. Pt continues to benefit from skilled PT.     Rehab Potential  Good    PT Frequency  1x / week    PT Duration  12 weeks    PT Treatment/Interventions  Therapeutic exercise;Therapeutic activities;Functional mobility training;Patient/family education;Balance training;Neuromuscular re-education;Manual techniques;Scar mobilization;Passive range of motion;Moist Heat    Consulted and Agree with Plan of Care  Patient       Patient will benefit from skilled therapeutic intervention in order to improve the following deficits  and impairments:  Increased fascial restricitons, Decreased range of motion, Decreased endurance, Decreased safety awareness, Increased  muscle spasms, Decreased activity tolerance, Pain, Hypomobility, Impaired flexibility, Decreased scar mobility, Postural dysfunction, Decreased mobility, Decreased coordination  Visit Diagnosis: Sacrococcygeal disorders, not elsewhere classified  Other symptoms and signs involving the musculoskeletal system  Other abnormalities of gait and mobility  Myalgia     Problem List Patient Active Problem List   Diagnosis Date Noted  . Pain in limb 12/18/2016  . Chronic venous insufficiency 10/16/2016  . Varicose veins of both lower extremities with pain 10/16/2016  . Lymphedema 10/16/2016  . Barrett's esophagus determined by endoscopy 01/25/2016  . Vitamin B12 deficiency 10/18/2015  . Chronic neck and back pain 08/19/2015  . Body odor 07/09/2015  . GAD (generalized anxiety disorder) 07/09/2015  . Allergic rhinitis with postnasal drip 07/09/2015  . Dyslipidemia 05/05/2015  . Acquired hypothyroidism 05/05/2015  . History of malignant carcinoid tumor of stomach 03/26/2012    Jerl Mina ,PT, DPT, E-RYT  11/21/2017, 5:03 PM  [place] MAIN [facility] SERVICES [address] [place], [place], [address] Phone: [phone]   Fax:  [phone]  Name: MAYTHE DERAMO MRN: [id number] Date of Birth: 07-28-1961

## 2017-11-21 NOTE — Patient Instructions (Signed)
Scoliosis specific exercises   Bend R elbow:  elbow slide from ribs towards ear  ( do not lift from bed)  10 reps    R side only   _______  Open book ( handout)    _______ Wear shoe lift in L shoe   _______    REVERSE    : hands on chair  Feet are hip width apart, L foot one behind like you are on ski tracks,  R knee bent over ankle but not more forward then the ankle.  Make sure 50% weight is in the front foot/leg , 50% weight is the back foot/ leg    Rest R forearm lightly on top of thigh,  L hand on L hip.  Inhale lengthen spine,   Exhale turn navel to the L then the ribcage turns, look at the other wall.  Keep maintaining  50% weight is in the front foot/leg , 50% weight is the back foot/ leg  And make sure the front knee is still pointed in the toe line of the 2nd toe.   3 breaths here

## 2017-11-23 DIAGNOSIS — J301 Allergic rhinitis due to pollen: Secondary | ICD-10-CM | POA: Diagnosis not present

## 2017-11-28 ENCOUNTER — Ambulatory Visit: Payer: 59 | Attending: Nurse Practitioner | Admitting: Physical Therapy

## 2017-11-28 DIAGNOSIS — M533 Sacrococcygeal disorders, not elsewhere classified: Secondary | ICD-10-CM | POA: Diagnosis not present

## 2017-11-28 DIAGNOSIS — R29898 Other symptoms and signs involving the musculoskeletal system: Secondary | ICD-10-CM | POA: Insufficient documentation

## 2017-11-28 DIAGNOSIS — R2689 Other abnormalities of gait and mobility: Secondary | ICD-10-CM | POA: Insufficient documentation

## 2017-11-28 DIAGNOSIS — M791 Myalgia, unspecified site: Secondary | ICD-10-CM | POA: Diagnosis present

## 2017-11-28 NOTE — Therapy (Signed)
[place] MAIN [facility] SERVICES [address] [place], [place], [address] Phone: [phone]   Fax:  [phone]  Physical Therapy Treatment  Patient Details  Name: Dana Hensley MRN: [id number] Date of Birth: 1961-01-10 Referring Provider: Gaetano Net, NP    Encounter Date: 11/28/2017  PT End of Session - 11/28/17 0904    Visit Number  4    Number of Visits  12    Date for PT Re-Evaluation  01/16/18    PT Start Time  0807    PT Stop Time  0904    PT Time Calculation (min)  57 min    Activity Tolerance  Patient tolerated treatment well    Behavior During Therapy  [facility] for tasks assessed/performed       Past Medical History:  Diagnosis Date  . Allergy   . Anxiety   . Asthma   . B12 deficiency   . Barrett esophagus   . Chronic atrophic gastritis   . DVT, lower extremity ([place])   . Gastric polyps   . HLD (hyperlipidemia)   . Hypothyroidism   . IGT (impaired glucose tolerance)   . PONV (postoperative nausea and vomiting)   . Vitamin D deficiency     Past Surgical History:  Procedure Laterality Date  . ABDOMINAL HYSTERECTOMY    . APPENDECTOMY    . CHOLECYSTECTOMY N/A 11/16/2016   Procedure: LAPAROSCOPIC CHOLECYSTECTOMY WITH INTRAOPERATIVE CHOLANGIOGRAM;  Surgeon: Leonie Green, MD;  Location: [facility] ORS;  Service: General;  Laterality: N/A;  . DIAGNOSTIC LAPAROSCOPY    . ESOPHAGOGASTRODUODENOSCOPY    . TONSILLECTOMY    . VEIN LIGATION AND STRIPPING      There were no vitals filed for this visit.  Subjective Assessment - 11/28/17 0813    Subjective  Pt reported her tailbone pain is much less compared to last visit. Pt's tailbone pain does not hurt anymore when getting up from a chair but she feels mild ache around the side buttocks and deep muscles. Pt's neck pain was better for the first couple of days. Today pt feels pain in the midback. Pt was able to walk for 3 hours solid and speaking with her friend on the phone.      Pertinent History  Gyn Hx: abdominal hysterectomy.  Additional surgery: appendix and gall bladder removal. Denied urinary leakage nor low back pain. pt reports she has been seen by a gynecologist who told her everything was fine and recommende her to see an orthopedist. She also saw two GI doctors for the constipation and was told to get a colonoscopy due to blood in her stool.  Pt has had hemorrhoids before. Pt had bleeding in her stools 5 years ago which a colonscopy then showed nothing. Pt notices hemorrhoids with wiping and with lifting something.  Pt also has seen a PT at [facility] and she refered pt to a specialized PT ebcause she suspect that pt's problems were related to the pelvic floor.      Patient Stated Goals  less pain         [facility] PT Assessment - 11/28/17 0820      Strength   Overall Strength Comments  hip abd 3/5 B       Palpation   Spinal mobility  decreased QL mm tightness.  R thoracolumbar at T7, R anterior lumbar curve at T12, normal R rotation      Palpation comment  increased tensions at R interspinal mm T7-12 and intercostal mm  tightenss lower ribs  ( decreased post Tx)                  [place] Adult PT Treatment/Exercise - 11/28/17 0903      Neuro Re-ed    Neuro Re-ed Details   see pt instruction      Manual Therapy   Manual therapy comments  STM and rotational mob Grade II at interspinal T7-12 and intercostal mm              PT Education - 11/28/17 1428    Education provided  Yes    Education Details  HEP    Person(s) Educated  Patient    Methods  Explanation;Tactile cues;Verbal cues;Handout;Demonstration    Comprehension  Verbalized understanding          PT Long Term Goals - 11/28/17 0910      PT LONG TERM GOAL #1   Title  Pt will decrease PDI 24% to <19  % in order to return to ADLs without tailbone pain    Time  12    Period  Weeks    Status  On-going      PT LONG TERM GOAL #2   Title  PT will decrease her NDI score 30% to <15  % in order to work without neck and shoulder pain     Time  12    Period  Weeks    Status  On-going      PT LONG TERM GOAL #3   Title  Pt will demo no coccyx deviation to R,  no tenderness/tensions at coccygeus, and more symmetrically aligned ASIS with increased R SIJ mobility  across 2 visits in order to sit and lift her grandson     Time  2    Period  Weeks    Status  Achieved      PT LONG TERM GOAL #4   Title  Pt will demo decreased scar restrictions over mutliple abdominal scars in order to improve deep core coordination. ROM, and strength for motility and pelvic floor function , less constipation episodes     Time  8    Period  Weeks    Status  On-going      PT LONG TERM GOAL #5   Title  Pt will increase hip ext stength B to 5/5 and decreased perineal scar restriction in order to perform sit to stand with no pain     Time  4    Period  Weeks    Status  New            Plan - 11/28/17 1829    Clinical Impression Statement  Pt showed decreased quadratus laborum mm tightness on L which is a good cary over from last session. Pt continues to show more equally levelled pelvis and coccyx and pt continues to wear her shoe lift.  Today, pt required manual Tx along R thoracic spine between her scoliotic curves which helped to minimize mm tensions at interspinals between T7-12 and intercostals. Pt also showed decreased hip abduction strength.  Added scoliosis specific HEP ( to lengthen on R and minimize worsening of anterior trunk R rotation) and hip strengthening exercises.  Pt continues to benefit from skilled PT.      Rehab Potential  Good    PT Frequency  1x / week    PT Duration  12 weeks    PT Treatment/Interventions  Therapeutic exercise;Therapeutic activities;Functional mobility training;Patient/family education;Balance training;Neuromuscular re-education;Manual techniques;Scar mobilization;Passive range  of motion;Moist Heat    Consulted and Agree with Plan of Care  Patient        Patient will benefit from skilled therapeutic intervention in order to improve the following deficits and impairments:  Increased fascial restricitons, Decreased range of motion, Decreased endurance, Decreased safety awareness, Increased muscle spasms, Decreased activity tolerance, Pain, Hypomobility, Impaired flexibility, Decreased scar mobility, Postural dysfunction, Decreased mobility, Decreased coordination  Visit Diagnosis: Sacrococcygeal disorders, not elsewhere classified  Other symptoms and signs involving the musculoskeletal system  Other abnormalities of gait and mobility  Myalgia     Problem List Patient Active Problem List   Diagnosis Date Noted  . Pain in limb 12/18/2016  . Chronic venous insufficiency 10/16/2016  . Varicose veins of both lower extremities with pain 10/16/2016  . Lymphedema 10/16/2016  . Barrett's esophagus determined by endoscopy 01/25/2016  . Vitamin B12 deficiency 10/18/2015  . Chronic neck and back pain 08/19/2015  . Body odor 07/09/2015  . GAD (generalized anxiety disorder) 07/09/2015  . Allergic rhinitis with postnasal drip 07/09/2015  . Dyslipidemia 05/05/2015  . Acquired hypothyroidism 05/05/2015  . History of malignant carcinoid tumor of stomach 03/26/2012    Jerl Mina ,PT, DPT, E-RYT  11/28/2017, 2:36 PM  [place] MAIN [facility] SERVICES [address] [place], [place], [address] Phone: [phone]   Fax:  [phone]  Name: Dana Hensley MRN: [id number] Date of Birth: 05-24-1961

## 2017-11-28 NOTE — Patient Instructions (Addendum)
For R side body due to scoliosis curves :  1)  Place band on floor like a "u" and then place ballmounds over the band while laying on back w/ knees bent, knees hip width apart   "R angel wing" exercise  10 reps x 3 sets   Band is placed under feet, knees bent, feet are hip width apart Hold band in R hand only  with thumbs point out, keep upper arm and elbow touching the bed the whole time  - inhale and then exhale pull bands by bending elbows hands move in a up like a "wing" keep shoulder blade the entire upward and downward movement   (feel shoulder blades squeezing)      _______  For hip strengthening   Clam Shell 45 Degrees   Lying on L side with hips and knees bent 45, one pillow between knees and ankles. Lift Right knee with exhale. Be sure pelvis does not roll backward. Do not arch back. Do 20 times, each leg, 2 times per day.      Seated up with elbow straight, palm in line with hip, side body lengthened, shoulder down from ears  R hip down on the floor Lift left knee with exhale.   __________   For strengthening middle of back:  Lat pull down  band on other side of the door knob Squeeze shoulder blades together first,  Inhale, exhale, pull bands past your pocket without lifting shoulders up  10 x 2 right foot forward,   10 x 2 left foot forward  Feet hip width apart

## 2017-12-05 ENCOUNTER — Ambulatory Visit: Payer: 59 | Admitting: Physical Therapy

## 2017-12-05 DIAGNOSIS — M533 Sacrococcygeal disorders, not elsewhere classified: Secondary | ICD-10-CM | POA: Diagnosis not present

## 2017-12-05 DIAGNOSIS — M791 Myalgia, unspecified site: Secondary | ICD-10-CM

## 2017-12-05 DIAGNOSIS — R29898 Other symptoms and signs involving the musculoskeletal system: Secondary | ICD-10-CM

## 2017-12-05 DIAGNOSIS — R2689 Other abnormalities of gait and mobility: Secondary | ICD-10-CM

## 2017-12-05 NOTE — Patient Instructions (Signed)
Bridging series w/ resistive band other side of doorknob:  Level 1:  Position:  Elbows bent, knees hip width apart, heels under knees on top of stable  foot stool   Stabilization points: shoulders, upper arms, back of head pressed into floor. Heel press downward.   Movement: inhale do nothing, exhale pull band by side, lower fists to floor completely while lifting hips.Keep stabilization points engaged when you allow the band to go back to starting position  10 x 2 reps       Level 2:  Position:  Elbows straight, arms raised to ceiling at shoulder height, knees apart like a ballerina,heels together, heels under knees, on top of stable  foot stool   Stabilization points: shoulders, upper arms, back of head pressed into floor. Heel press downward.   Movement: inhale do nothing, exhale pull band by side, lower fists to floor completely while lifting hips. Keep stabilization points engaged when you allow the band to go back to starting position   10 x 2 reps  Shoulder training: Try to imagine you are squeezing a pencil under your armpit and your shoulder blades are down away from your ears and towards each other      

## 2017-12-05 NOTE — Therapy (Signed)
[place] MAIN [facility] SERVICES [address] [place], [place], [address] Phone: [phone]   Fax:  [phone]  Physical Therapy Treatment  Patient Details  Name: Dana Hensley MRN: [id number] Date of Birth: 07-31-1961 Referring Provider: Gaetano Net, NP    Encounter Date: 12/05/2017  PT End of Session - 12/05/17 1754    Visit Number  5    Number of Visits  12    Date for PT Re-Evaluation  01/16/18    PT Start Time  6962    PT Stop Time  1755    PT Time Calculation (min)  45 min    Activity Tolerance  Patient tolerated treatment well    Behavior During Therapy  [facility] for tasks assessed/performed       Past Medical History:  Diagnosis Date  . Allergy   . Anxiety   . Asthma   . B12 deficiency   . Barrett esophagus   . Chronic atrophic gastritis   . DVT, lower extremity ([place])   . Gastric polyps   . HLD (hyperlipidemia)   . Hypothyroidism   . IGT (impaired glucose tolerance)   . PONV (postoperative nausea and vomiting)   . Vitamin D deficiency     Past Surgical History:  Procedure Laterality Date  . ABDOMINAL HYSTERECTOMY    . APPENDECTOMY    . CHOLECYSTECTOMY N/A 11/16/2016   Procedure: LAPAROSCOPIC CHOLECYSTECTOMY WITH INTRAOPERATIVE CHOLANGIOGRAM;  Surgeon: Leonie Green, MD;  Location: [facility] ORS;  Service: General;  Laterality: N/A;  . DIAGNOSTIC LAPAROSCOPY    . ESOPHAGOGASTRODUODENOSCOPY    . TONSILLECTOMY    . VEIN LIGATION AND STRIPPING      There were no vitals filed for this visit.  Subjective Assessment - 12/05/17 1711    Subjective  Pt reported her mdback pain is better by 50%.     Pertinent History  Gyn Hx: abdominal hysterectomy.  Additional surgery: appendix and gall bladder removal. Denied urinary leakage nor low back pain. pt reports she has been seen by a gynecologist who told her everything was fine and recommende her to see an orthopedist. She also saw two GI doctors for the constipation and was told  to get a colonoscopy due to blood in her stool.  Pt has had hemorrhoids before. Pt had bleeding in her stools 5 years ago which a colonscopy then showed nothing. Pt notices hemorrhoids with wiping and with lifting something.  Pt also has seen a PT at [place] and she refered pt to a specialized PT ebcause she suspect that pt's problems were related to the pelvic floor.      Patient Stated Goals  less pain         [facility] PT Assessment - 12/05/17 1752      Palpation   Spinal mobility  decreased hypomobility at thoracic spine                Pelvic Floor Special Questions - 12/05/17 1749    Pelvic Floor Internal Exam  pt consented verbally without contraindications after being explained details to exam     Exam Type  Vaginal    Palpation  decreased perineal scar at 1st layer 3-5 o clock     Strength  fair squeeze, definite lift        [facility] Adult PT Treatment/Exercise - 12/05/17 1754      Neuro Re-ed    Neuro Re-ed Details   see pt instruction      Moist  Heat Therapy   Number Minutes Moist Heat  5 Minutes    Moist Heat Location  -- thoracic/ lumbar spine       Manual Therapy   Internal Pelvic Floor  STM perineal scar at 1st layer 3-5 o clock                  PT Long Term Goals - 11/28/17 0910      PT LONG TERM GOAL #1   Title  Pt will decrease PDI 24% to <19  % in order to return to ADLs without tailbone pain    Time  12    Period  Weeks    Status  On-going      PT LONG TERM GOAL #2   Title  PT will decrease her NDI score 30% to <15 % in order to work without neck and shoulder pain     Time  12    Period  Weeks    Status  On-going      PT LONG TERM GOAL #3   Title  Pt will demo no coccyx deviation to R,  no tenderness/tensions at coccygeus, and more symmetrically aligned ASIS with increased R SIJ mobility  across 2 visits in order to sit and lift her grandson     Time  2    Period  Weeks    Status  Achieved      PT LONG TERM GOAL #4   Title  Pt  will demo decreased scar restrictions over mutliple abdominal scars in order to improve deep core coordination. ROM, and strength for motility and pelvic floor function , less constipation episodes     Time  8    Period  Weeks    Status  On-going      PT LONG TERM GOAL #5   Title  Pt will increase hip ext stength B to 5/5 and decreased perineal scar restriction in order to perform sit to stand with no pain     Time  4    Period  Weeks    Status  New            Plan - 12/05/17 1755    Clinical Impression Statement  Pt is progressing well with decreased pelvic floor tensions and at her mid back. Pt demo'd improved pelvic floor ROM and strength. Progressed tro thoracolumbar strengthening with red band today.      Rehab Potential  Good    PT Frequency  1x / week    PT Duration  12 weeks    PT Treatment/Interventions  Therapeutic exercise;Therapeutic activities;Functional mobility training;Patient/family education;Balance training;Neuromuscular re-education;Manual techniques;Scar mobilization;Passive range of motion;Moist Heat    Consulted and Agree with Plan of Care  Patient       Patient will benefit from skilled therapeutic intervention in order to improve the following deficits and impairments:  Increased fascial restricitons, Decreased range of motion, Decreased endurance, Decreased safety awareness, Increased muscle spasms, Decreased activity tolerance, Pain, Hypomobility, Impaired flexibility, Decreased scar mobility, Postural dysfunction, Decreased mobility, Decreased coordination  Visit Diagnosis: Sacrococcygeal disorders, not elsewhere classified  Other symptoms and signs involving the musculoskeletal system  Other abnormalities of gait and mobility  Myalgia     Problem List Patient Active Problem List   Diagnosis Date Noted  . Pain in limb 12/18/2016  . Chronic venous insufficiency 10/16/2016  . Varicose veins of both lower extremities with pain 10/16/2016  .  Lymphedema 10/16/2016  . Barrett's esophagus determined by endoscopy  01/25/2016  . Vitamin B12 deficiency 10/18/2015  . Chronic neck and back pain 08/19/2015  . Body odor 07/09/2015  . GAD (generalized anxiety disorder) 07/09/2015  . Allergic rhinitis with postnasal drip 07/09/2015  . Dyslipidemia 05/05/2015  . Acquired hypothyroidism 05/05/2015  . History of malignant carcinoid tumor of stomach 03/26/2012    Jerl Mina ,PT, DPT, E-RYT  12/05/2017, 5:56 PM  [place] MAIN [facility] SERVICES [address] [place], [place], [address] Phone: [phone]   Fax:  [phone]  Name: Dana Hensley MRN: [id number] Date of Birth: 10-01-1961

## 2017-12-07 DIAGNOSIS — D122 Benign neoplasm of ascending colon: Secondary | ICD-10-CM | POA: Diagnosis not present

## 2017-12-07 DIAGNOSIS — K625 Hemorrhage of anus and rectum: Secondary | ICD-10-CM | POA: Diagnosis not present

## 2017-12-07 DIAGNOSIS — K635 Polyp of colon: Secondary | ICD-10-CM | POA: Diagnosis not present

## 2017-12-07 DIAGNOSIS — K648 Other hemorrhoids: Secondary | ICD-10-CM | POA: Diagnosis not present

## 2017-12-10 DIAGNOSIS — J301 Allergic rhinitis due to pollen: Secondary | ICD-10-CM | POA: Diagnosis not present

## 2017-12-12 ENCOUNTER — Ambulatory Visit: Payer: 59 | Admitting: Physical Therapy

## 2017-12-18 ENCOUNTER — Ambulatory Visit: Payer: 59 | Admitting: Physical Therapy

## 2017-12-26 ENCOUNTER — Ambulatory Visit: Payer: 59 | Admitting: Physical Therapy

## 2018-01-02 DIAGNOSIS — M546 Pain in thoracic spine: Secondary | ICD-10-CM | POA: Diagnosis not present

## 2018-01-02 DIAGNOSIS — M9902 Segmental and somatic dysfunction of thoracic region: Secondary | ICD-10-CM | POA: Diagnosis not present

## 2018-01-02 DIAGNOSIS — M7918 Myalgia, other site: Secondary | ICD-10-CM | POA: Diagnosis not present

## 2018-01-08 ENCOUNTER — Ambulatory Visit: Payer: 59 | Attending: Nurse Practitioner | Admitting: Physical Therapy

## 2018-01-08 DIAGNOSIS — R2689 Other abnormalities of gait and mobility: Secondary | ICD-10-CM | POA: Diagnosis not present

## 2018-01-08 DIAGNOSIS — M791 Myalgia, unspecified site: Secondary | ICD-10-CM | POA: Insufficient documentation

## 2018-01-08 DIAGNOSIS — R29898 Other symptoms and signs involving the musculoskeletal system: Secondary | ICD-10-CM | POA: Diagnosis present

## 2018-01-08 DIAGNOSIS — M533 Sacrococcygeal disorders, not elsewhere classified: Secondary | ICD-10-CM | POA: Diagnosis not present

## 2018-01-08 NOTE — Patient Instructions (Addendum)
1) Deep core level 2 ( knee out 15-20 deg) without rocking of hips     2) Bridging series w/ yellow resistive band other side of doorknob:  Level 1:  Position:  Elbows bent, knees hip width apart, heels under knees on top of stable  foot stool   Stabilization points: shoulders, upper arms, back of head pressed into floor. Heel press downward.   Movement: inhale do nothing, exhale pull band by side, lower fists to floor completely while lifting hips.Keep stabilization points engaged when you allow the band to go back to starting position  10 x 2 reps   3) Wall Squat   Feet shoulder width apart, a few inches in front of wall, lean against wall by squeezing shoulder blades together and back of head pressed against the wall. Inhale as you lower by bending hips and knees just enough that you can still see your toes.  Exhale, engage deep core muscles as you rise up to stand. Repeat _10___ times. Do _2-3___ sessions per day.  Copyright  VHI. All rights reserved.    4) standing march  21min land on midfoot, feet hip width apart Tap thighs, knee are high "climbing stairs"

## 2018-01-09 ENCOUNTER — Encounter: Payer: Self-pay | Admitting: Physical Therapy

## 2018-01-10 NOTE — Therapy (Addendum)
[place] MAIN [facility] SERVICES [address] [place], [place], [address] Phone: [phone]   Fax:  [phone]  Physical Therapy Treatment  Patient Details  Name: Dana Hensley MRN: [id number] Date of Birth: March 08, 1961 Referring Provider: Gaetano Net, NP    Encounter Date: 01/08/2018    Past Medical History:  Diagnosis Date  . Allergy   . Anxiety   . Asthma   . B12 deficiency   . Barrett esophagus   . Chronic atrophic gastritis   . DVT, lower extremity ([place])   . Gastric polyps   . HLD (hyperlipidemia)   . Hypothyroidism   . IGT (impaired glucose tolerance)   . Neuroendocrine tumor    carcinoid tumors of the stomach, s/p extraction surgery at [facility] [place]  . PONV (postoperative nausea and vomiting)   . Tumor cells, malignant ([place])    Hx of carcinoid tumors of the stomach, s/p extraction surgery at [facility]  . Vitamin D deficiency     Past Surgical History:  Procedure Laterality Date  . ABDOMINAL HYSTERECTOMY    . APPENDECTOMY    . CHOLECYSTECTOMY N/A 11/16/2016   Procedure: LAPAROSCOPIC CHOLECYSTECTOMY WITH INTRAOPERATIVE CHOLANGIOGRAM;  Surgeon: Leonie Green, MD;  Location: [facility] ORS;  Service: General;  Laterality: N/A;  . DIAGNOSTIC LAPAROSCOPY    . ESOPHAGOGASTRODUODENOSCOPY    . TONSILLECTOMY    . VEIN LIGATION AND STRIPPING      There were no vitals filed for this visit.  Subjective Assessment - 01/10/18 1755    Subjective  Pt reports her tailbone has increased from 2/10  to 3-4 /10 pain level. Pt has not performed her exercises as much and also she got sick with her grandkids. Her back pain came after lifting her grandchild. She saw her chiropractor which helped her back pain.     Pertinent History  Gyn Hx: abdominal hysterectomy.  Additional surgery: appendix and gall bladder removal. Denied urinary leakage nor low back pain. pt reports she has been seen by a gynecologist who told her everything was fine and recommende her  to see an orthopedist. She also saw two GI doctors for the constipation and was told to get a colonoscopy due to blood in her stool.  Pt has had hemorrhoids before. Pt had bleeding in her stools 5 years ago which a colonscopy then showed nothing. Pt notices hemorrhoids with wiping and with lifting something.  Pt also has seen a PT at [place] and she refered pt to a specialized PT ebcause she suspect that pt's problems were related to the pelvic floor.      Patient Stated Goals  less pain         [facility] PT Assessment - 01/10/18 1754      Strength   Overall Strength Comments  hip ext 5/5 B . scaption UE B 4-/5 opp glut 5/5 minor perturbation       Palpation   SI assessment   no deviation of coccyx , no tenderness                   [facility] Adult PT Treatment/Exercise - 01/10/18 1754      Neuro Re-ed    Neuro Re-ed Details   see pt instruction                  PT Long Term Goals - 01/08/18 1706      PT LONG TERM GOAL #1   Title  Pt will decrease PDI 24% to <  19  % in order to return to ADLs without tailbone pain ( 2/12: 14%)     Time  12    Period  Weeks    Status  Achieved      PT LONG TERM GOAL #2   Title  PT will decrease her NDI score 30% to <15 % in order to work without neck and shoulder pain     Time  12    Period  Weeks    Status  On-going      PT LONG TERM GOAL #3   Title  Pt will demo no coccyx deviation to R,  no tenderness/tensions at coccygeus, and more symmetrically aligned ASIS with increased R SIJ mobility  across 2 visits in order to sit and lift her grandson     Time  2    Period  Weeks    Status  Achieved      PT LONG TERM GOAL #4   Title  Pt will demo decreased scar restrictions over mutliple abdominal scars in order to improve deep core coordination. ROM, and strength for motility and pelvic floor function , less constipation episodes     Time  8    Period  Weeks    Status  Achieved      PT LONG TERM GOAL #5   Title  Pt will  increase hip ext stength B to 5/5 and decreased perineal scar restriction in order to perform sit to stand with no pain     Time  4    Period  Weeks    Status  Achieved      Additional Long Term Goals   Additional Long Term Goals  Yes      PT LONG TERM GOAL #6   Title  Pt will report decreased neck and midback pain by 50% when sitting at work across 1 week    Time  12    Period  Weeks    Status  New    Target Date  04/02/18            Plan - 01/10/18 1755    Clinical Impression Statement  Pt has returned to PT after one month. Pt has achieved 4/6 goals. Pt's Pain Disability Index decreased significantly 24% to 14% and she been able to sit, get out of chair with less problems. Pt's leg length difference, coccyndia, scoliosis has been addressed. Pt continues to further strengthening to address neck issues.   Despite not having performed her exercises over the past weeks, pt showed good carry over with no coccyx deviations, nor pelvic malalignement/pelvic floor mm tenderness/tightness. Pt showed increased glut strength but weak thoracolumbar strength. Pt was educated on importance of compliance to HEP. Pt voiced understanding.  Advanced pt on exercises. Pt continues to benefit from skilled PT.    Rehab Potential  Good    PT Frequency  1x / week    PT Duration  12 weeks    PT Treatment/Interventions  Therapeutic exercise;Therapeutic activities;Functional mobility training;Patient/family education;Balance training;Neuromuscular re-education;Manual techniques;Scar mobilization;Passive range of motion;Moist Heat    Consulted and Agree with Plan of Care  Patient       Patient will benefit from skilled therapeutic intervention in order to improve the following deficits and impairments:  Increased fascial restricitons, Decreased range of motion, Decreased endurance, Decreased safety awareness, Increased muscle spasms, Decreased activity tolerance, Pain, Hypomobility, Impaired flexibility,  Decreased scar mobility, Postural dysfunction, Decreased mobility, Decreased coordination  Visit Diagnosis: Sacrococcygeal disorders, not  elsewhere classified  Other symptoms and signs involving the musculoskeletal system  Other abnormalities of gait and mobility  Myalgia     Problem List Patient Active Problem List   Diagnosis Date Noted  . Pain in limb 12/18/2016  . Chronic venous insufficiency 10/16/2016  . Varicose veins of both lower extremities with pain 10/16/2016  . Lymphedema 10/16/2016  . Barrett's esophagus determined by endoscopy 01/25/2016  . Vitamin B12 deficiency 10/18/2015  . Chronic neck and back pain 08/19/2015  . Body odor 07/09/2015  . GAD (generalized anxiety disorder) 07/09/2015  . Allergic rhinitis with postnasal drip 07/09/2015  . Dyslipidemia 05/05/2015  . Acquired hypothyroidism 05/05/2015  . History of malignant carcinoid tumor of stomach 03/26/2012    Jerl Mina ,PT, DPT, E-RYT  01/10/2018, 5:58 PM  [place] MAIN [facility] SERVICES [address] [place], [place], [address] Phone: [phone]   Fax:  [phone]  Name: SUMIKO CEASAR MRN: [id number] Date of Birth: 09/20/1961

## 2018-01-10 NOTE — Addendum Note (Signed)
Addended by: Jerl Mina on: 01/10/2018 06:06 PM   Modules accepted: Orders

## 2018-01-16 DIAGNOSIS — R945 Abnormal results of liver function studies: Secondary | ICD-10-CM | POA: Diagnosis not present

## 2018-01-18 DIAGNOSIS — R945 Abnormal results of liver function studies: Secondary | ICD-10-CM | POA: Diagnosis not present

## 2018-01-18 DIAGNOSIS — R7989 Other specified abnormal findings of blood chemistry: Secondary | ICD-10-CM | POA: Insufficient documentation

## 2018-01-30 ENCOUNTER — Ambulatory Visit: Payer: 59 | Admitting: Physical Therapy

## 2018-02-12 ENCOUNTER — Ambulatory Visit: Payer: 59 | Attending: Nurse Practitioner | Admitting: Physical Therapy

## 2018-02-12 DIAGNOSIS — R2689 Other abnormalities of gait and mobility: Secondary | ICD-10-CM | POA: Diagnosis present

## 2018-02-12 DIAGNOSIS — M533 Sacrococcygeal disorders, not elsewhere classified: Secondary | ICD-10-CM | POA: Diagnosis not present

## 2018-02-12 DIAGNOSIS — R29898 Other symptoms and signs involving the musculoskeletal system: Secondary | ICD-10-CM | POA: Insufficient documentation

## 2018-02-12 DIAGNOSIS — M791 Myalgia, unspecified site: Secondary | ICD-10-CM | POA: Insufficient documentation

## 2018-02-12 NOTE — Patient Instructions (Signed)
To release midback pain/ muscle tensions:  Yoga block (2") behind midback longways , a block under head   Reposition into lengthen spine , pelvic neutral  Allow knees to move ~10-15 deg so to allow your body weight to hang over edge of the block and back to center, then the other side  10 min here.

## 2018-02-12 NOTE — Therapy (Signed)
[place] MAIN [facility] SERVICES [address] [place], [place], [address] Phone: [phone]   Fax:  [phone]  Physical Therapy Treatment  Patient Details  Name: Dana Hensley MRN: [id number] Date of Birth: [birth year] Referring Provider: Gaetano Net, NP    Encounter Date: 02/12/2018  PT End of Session - 02/12/18 1711    Visit Number  7    Number of Visits  12    Date for PT Re-Evaluation  04/03/18    PT Start Time  1700    PT Stop Time  1755    PT Time Calculation (min)  55 min    Activity Tolerance  Patient tolerated treatment well    Behavior During Therapy  [facility] for tasks assessed/performed       Past Medical History:  Diagnosis Date  . Allergy   . Anxiety   . Asthma   . B12 deficiency   . Barrett esophagus   . Chronic atrophic gastritis   . DVT, lower extremity ([place])   . Gastric polyps   . HLD (hyperlipidemia)   . Hypothyroidism   . IGT (impaired glucose tolerance)   . Neuroendocrine tumor    carcinoid tumors of the stomach, s/p extraction surgery at [facility]  . PONV (postoperative nausea and vomiting)   . Tumor cells, malignant ([place])    Hx of carcinoid tumors of the stomach, s/p extraction surgery at [facility]  . Vitamin D deficiency     Past Surgical History:  Procedure Laterality Date  . ABDOMINAL HYSTERECTOMY    . APPENDECTOMY    . CHOLECYSTECTOMY N/A 11/16/2016   Procedure: LAPAROSCOPIC CHOLECYSTECTOMY WITH INTRAOPERATIVE CHOLANGIOGRAM;  Surgeon: Leonie Green, MD;  Location: [facility] ORS;  Service: General;  Laterality: N/A;  . DIAGNOSTIC LAPAROSCOPY    . ESOPHAGOGASTRODUODENOSCOPY    . TONSILLECTOMY    . VEIN LIGATION AND STRIPPING      There were no vitals filed for this visit.  Subjective Assessment - 02/12/18 1711    Subjective  Pt reports has been doing her exercises. Her pain is now only discomfort and she has bene able to sit at work for almost the whole day.     Pertinent History  Gyn Hx: abdominal  hysterectomy.  Additional surgery: appendix and gall bladder removal. Denied urinary leakage nor low back pain. pt reports she has been seen by a gynecologist who told her everything was fine and recommende her to see an orthopedist. She also saw two GI doctors for the constipation and was told to get a colonoscopy due to blood in her stool.  Pt has had hemorrhoids before. Pt had bleeding in her stools 5 years ago which a colonscopy then showed nothing. Pt notices hemorrhoids with wiping and with lifting something.  Pt also has seen a PT at [place] and she refered pt to a specialized PT ebcause she suspect that pt's problems were related to the pelvic floor.      Patient Stated Goals  less pain         [facility] PT Assessment - 02/12/18 1801      Strength   Overall Strength Comments  hip ext 5/5 B . scaption UE B 5/5 opp glut 5/5 less  perturbation                Pelvic Floor Special Questions - 02/12/18 1800    Pelvic Floor Internal Exam  pt consented verbally without contraindications after being explained details to exam  Exam Type  Vaginal    Palpation  tednerness/tightenss at R 7-9 o clock scar restrictions ( improved post Tx)    Strength  fair squeeze, definite lift        [facility] Adult PT Treatment/Exercise - 02/12/18 1801      Neuro Re-ed    Neuro Re-ed Details   see pt instruction      Moist Heat Therapy   Number Minutes Moist Heat  5 Minutes    Moist Heat Location  Cervical      Manual Therapy   Manual therapy comments  Grade III mob T-1-3     Internal Pelvic Floor  STM perineal scar at 1st layer 7-9 o clock, faciliation of pelvic floor to elicit Grade 3/5 lift              PT Education - 02/12/18 1759    Education provided  Yes    Education Details  HEP    Person(s) Educated  Patient    Methods  Explanation;Demonstration;Tactile cues;Verbal cues;Handout    Comprehension  Verbal cues required;Returned demonstration;Verbalized understanding;Tactile cues  required          PT Long Term Goals - 01/08/18 1706      PT LONG TERM GOAL #1   Title  Pt will decrease PDI 24% to <19  % in order to return to ADLs without tailbone pain ( 2/12: 14%)     Time  12    Period  Weeks    Status  Achieved      PT LONG TERM GOAL #2   Title  PT will decrease her NDI score 30% to <15 % in order to work without neck and shoulder pain     Time  12    Period  Weeks    Status  On-going      PT LONG TERM GOAL #3   Title  Pt will demo no coccyx deviation to R,  no tenderness/tensions at coccygeus, and more symmetrically aligned ASIS with increased R SIJ mobility  across 2 visits in order to sit and lift her grandson     Time  2    Period  Weeks    Status  Achieved      PT LONG TERM GOAL #4   Title  Pt will demo decreased scar restrictions over mutliple abdominal scars in order to improve deep core coordination. ROM, and strength for motility and pelvic floor function , less constipation episodes     Time  8    Period  Weeks    Status  Achieved      PT LONG TERM GOAL #5   Title  Pt will increase hip ext stength B to 5/5 and decreased perineal scar restriction in order to perform sit to stand with no pain     Time  4    Period  Weeks    Status  Achieved      Additional Long Term Goals   Additional Long Term Goals  Yes      PT LONG TERM GOAL #6   Title  Pt will report decreased neck and midback pain by 50% when sitting at work across 1 week    Time  12    Period  Weeks    Status  New    [employer] Date  04/02/18            Plan - 02/12/18 1803    Clinical Impression Statement  Pt returns after one month  and reports she is able to sit without pain for almost the whole work day. Pt demo'd retention of glut strength but had remaining pelvic floor tightness on R with scar-related restrictions. Pt showed increased mobility of pelvic floor with Grade 3/5 and quick contractions of 10 reps with cues post Tx. Withheld pelvic floor strengthening training  today.  Pt also showed increased hypomobility at upper thoracic segments which improved in mobility post Tx. Added self-mobilization exercise with yoga blocks for self-management and recommended frequent stretch breaks at work from her computer. Pt voiced understanding. Plan to progress to dynamic strengthening exercises at next session and possible pelvic floor strengthening training. Pt continues to benefit from skilled PT.    Rehab Potential  Good    PT Frequency  1x / week    PT Duration  12 weeks    PT Treatment/Interventions  Therapeutic exercise;Therapeutic activities;Functional mobility training;Patient/family education;Balance training;Neuromuscular re-education;Manual techniques;Scar mobilization;Passive range of motion;Moist Heat    Consulted and Agree with Plan of Care  Patient       Patient will benefit from skilled therapeutic intervention in order to improve the following deficits and impairments:  Increased fascial restricitons, Decreased range of motion, Decreased endurance, Decreased safety awareness, Increased muscle spasms, Decreased activity tolerance, Pain, Hypomobility, Impaired flexibility, Decreased scar mobility, Postural dysfunction, Decreased mobility, Decreased coordination  Visit Diagnosis: Sacrococcygeal disorders, not elsewhere classified  Other symptoms and signs involving the musculoskeletal system  Other abnormalities of gait and mobility  Myalgia     Problem List Patient Active Problem List   Diagnosis Date Noted  . Pain in limb 12/18/2016  . Chronic venous insufficiency 10/16/2016  . Varicose veins of both lower extremities with pain 10/16/2016  . Lymphedema 10/16/2016  . Barrett's esophagus determined by endoscopy 01/25/2016  . Vitamin B12 deficiency 10/18/2015  . Chronic neck and back pain 08/19/2015  . Body odor 07/09/2015  . GAD (generalized anxiety disorder) 07/09/2015  . Allergic rhinitis with postnasal drip 07/09/2015  . Dyslipidemia  05/05/2015  . Acquired hypothyroidism 05/05/2015  . History of malignant carcinoid tumor of stomach 03/26/2012    Jerl Mina ,PT, DPT, E-RYT   02/12/2018, 6:06 PM  [place] MAIN [facility] SERVICES [address] [place], [place], [address] Phone: [phone]   Fax:  [phone]  Name: BRYER GOTTSCH MRN: [id number] Date of Birth: 1961/01/06

## 2018-02-15 DIAGNOSIS — K228 Other specified diseases of esophagus: Secondary | ICD-10-CM | POA: Diagnosis not present

## 2018-02-15 DIAGNOSIS — K2950 Unspecified chronic gastritis without bleeding: Secondary | ICD-10-CM | POA: Diagnosis not present

## 2018-02-15 DIAGNOSIS — K3189 Other diseases of stomach and duodenum: Secondary | ICD-10-CM | POA: Diagnosis not present

## 2018-02-15 DIAGNOSIS — Z09 Encounter for follow-up examination after completed treatment for conditions other than malignant neoplasm: Secondary | ICD-10-CM | POA: Diagnosis not present

## 2018-02-15 DIAGNOSIS — C7A092 Malignant carcinoid tumor of the stomach: Secondary | ICD-10-CM | POA: Diagnosis not present

## 2018-02-15 DIAGNOSIS — Z86012 Personal history of benign carcinoid tumor: Secondary | ICD-10-CM | POA: Diagnosis not present

## 2018-02-15 DIAGNOSIS — K2940 Chronic atrophic gastritis without bleeding: Secondary | ICD-10-CM | POA: Diagnosis not present

## 2018-02-20 DIAGNOSIS — J301 Allergic rhinitis due to pollen: Secondary | ICD-10-CM | POA: Diagnosis not present

## 2018-02-26 ENCOUNTER — Ambulatory Visit: Payer: 59 | Admitting: Physical Therapy

## 2018-03-01 DIAGNOSIS — J301 Allergic rhinitis due to pollen: Secondary | ICD-10-CM | POA: Diagnosis not present

## 2018-03-07 DIAGNOSIS — J301 Allergic rhinitis due to pollen: Secondary | ICD-10-CM | POA: Diagnosis not present

## 2018-03-12 ENCOUNTER — Ambulatory Visit: Payer: 59 | Admitting: Physical Therapy

## 2018-03-20 DIAGNOSIS — H1031 Unspecified acute conjunctivitis, right eye: Secondary | ICD-10-CM | POA: Diagnosis not present

## 2018-03-26 ENCOUNTER — Encounter: Payer: 59 | Admitting: Physical Therapy

## 2018-04-01 DIAGNOSIS — Z860101 Personal history of adenomatous and serrated colon polyps: Secondary | ICD-10-CM | POA: Insufficient documentation

## 2018-04-01 DIAGNOSIS — K219 Gastro-esophageal reflux disease without esophagitis: Secondary | ICD-10-CM | POA: Diagnosis not present

## 2018-04-01 DIAGNOSIS — K2270 Barrett's esophagus without dysplasia: Secondary | ICD-10-CM | POA: Diagnosis not present

## 2018-04-01 DIAGNOSIS — R0789 Other chest pain: Secondary | ICD-10-CM | POA: Diagnosis not present

## 2018-04-01 DIAGNOSIS — Z86010 Personal history of colonic polyps: Secondary | ICD-10-CM | POA: Insufficient documentation

## 2018-04-09 DIAGNOSIS — M9901 Segmental and somatic dysfunction of cervical region: Secondary | ICD-10-CM | POA: Diagnosis not present

## 2018-04-09 DIAGNOSIS — M7918 Myalgia, other site: Secondary | ICD-10-CM | POA: Diagnosis not present

## 2018-04-09 DIAGNOSIS — M542 Cervicalgia: Secondary | ICD-10-CM | POA: Diagnosis not present

## 2018-05-23 DIAGNOSIS — M9901 Segmental and somatic dysfunction of cervical region: Secondary | ICD-10-CM | POA: Diagnosis not present

## 2018-05-23 DIAGNOSIS — M7918 Myalgia, other site: Secondary | ICD-10-CM | POA: Diagnosis not present

## 2018-05-23 DIAGNOSIS — M542 Cervicalgia: Secondary | ICD-10-CM | POA: Diagnosis not present

## 2018-05-24 DIAGNOSIS — R198 Other specified symptoms and signs involving the digestive system and abdomen: Secondary | ICD-10-CM | POA: Diagnosis not present

## 2018-05-24 DIAGNOSIS — R1013 Epigastric pain: Secondary | ICD-10-CM | POA: Diagnosis not present

## 2018-05-24 DIAGNOSIS — R0789 Other chest pain: Secondary | ICD-10-CM | POA: Diagnosis not present

## 2018-05-29 DIAGNOSIS — J301 Allergic rhinitis due to pollen: Secondary | ICD-10-CM | POA: Diagnosis not present

## 2018-06-06 DIAGNOSIS — J301 Allergic rhinitis due to pollen: Secondary | ICD-10-CM | POA: Diagnosis not present

## 2018-06-07 DIAGNOSIS — K30 Functional dyspepsia: Secondary | ICD-10-CM | POA: Diagnosis not present

## 2018-06-07 DIAGNOSIS — K2270 Barrett's esophagus without dysplasia: Secondary | ICD-10-CM | POA: Diagnosis not present

## 2018-06-17 DIAGNOSIS — M542 Cervicalgia: Secondary | ICD-10-CM | POA: Diagnosis not present

## 2018-06-17 DIAGNOSIS — M7918 Myalgia, other site: Secondary | ICD-10-CM | POA: Diagnosis not present

## 2018-06-17 DIAGNOSIS — M9901 Segmental and somatic dysfunction of cervical region: Secondary | ICD-10-CM | POA: Diagnosis not present

## 2018-07-16 DIAGNOSIS — M9901 Segmental and somatic dysfunction of cervical region: Secondary | ICD-10-CM | POA: Diagnosis not present

## 2018-07-16 DIAGNOSIS — M542 Cervicalgia: Secondary | ICD-10-CM | POA: Diagnosis not present

## 2018-07-16 DIAGNOSIS — M7918 Myalgia, other site: Secondary | ICD-10-CM | POA: Diagnosis not present

## 2018-07-30 ENCOUNTER — Other Ambulatory Visit: Payer: Self-pay | Admitting: Nurse Practitioner

## 2018-07-30 DIAGNOSIS — Z1231 Encounter for screening mammogram for malignant neoplasm of breast: Secondary | ICD-10-CM

## 2018-08-02 ENCOUNTER — Ambulatory Visit
Admission: RE | Admit: 2018-08-02 | Discharge: 2018-08-02 | Disposition: A | Discharge status: Home / Self Care | Payer: 59 | Source: Ambulatory Visit | Attending: Nurse Practitioner | Admitting: Nurse Practitioner

## 2018-08-02 DIAGNOSIS — R1310 Dysphagia, unspecified: Secondary | ICD-10-CM | POA: Diagnosis not present

## 2018-08-02 DIAGNOSIS — R0982 Postnasal drip: Secondary | ICD-10-CM | POA: Diagnosis not present

## 2018-08-02 DIAGNOSIS — R070 Pain in throat: Secondary | ICD-10-CM | POA: Diagnosis not present

## 2018-08-02 DIAGNOSIS — Z1231 Encounter for screening mammogram for malignant neoplasm of breast: Secondary | ICD-10-CM | POA: Insufficient documentation

## 2018-08-02 DIAGNOSIS — J309 Allergic rhinitis, unspecified: Secondary | ICD-10-CM | POA: Diagnosis not present

## 2018-08-29 ENCOUNTER — Ambulatory Visit (INDEPENDENT_AMBULATORY_CARE_PROVIDER_SITE_OTHER): Payer: 59 | Admitting: Gastroenterology

## 2018-08-29 ENCOUNTER — Encounter: Payer: Self-pay | Admitting: Gastroenterology

## 2018-08-29 ENCOUNTER — Other Ambulatory Visit: Payer: Self-pay

## 2018-08-29 VITALS — BP 120/66 | HR 57 | Ht 65.0 in | Wt 140.5 lb

## 2018-08-29 DIAGNOSIS — R140 Abdominal distension (gaseous): Secondary | ICD-10-CM

## 2018-08-29 DIAGNOSIS — R634 Abnormal weight loss: Secondary | ICD-10-CM

## 2018-08-29 DIAGNOSIS — R1084 Generalized abdominal pain: Secondary | ICD-10-CM

## 2018-08-29 DIAGNOSIS — R7302 Impaired glucose tolerance (oral): Secondary | ICD-10-CM | POA: Insufficient documentation

## 2018-08-29 DIAGNOSIS — L2084 Intrinsic (allergic) eczema: Secondary | ICD-10-CM | POA: Insufficient documentation

## 2018-08-29 DIAGNOSIS — E785 Hyperlipidemia, unspecified: Secondary | ICD-10-CM | POA: Insufficient documentation

## 2018-08-29 DIAGNOSIS — K581 Irritable bowel syndrome with constipation: Secondary | ICD-10-CM

## 2018-08-29 MED ORDER — DICYCLOMINE HCL 10 MG PO CAPS: 10.0000 mg | ORAL_CAPSULE | Freq: Three times a day (TID) | ORAL | 3 refills | Status: DC

## 2018-08-29 NOTE — Progress Notes (Signed)
Gastroenterology Consultation  Referring Provider:     Beverly Gust, MD Primary Care Physician:  Sallee Lange, NP Primary Gastroenterologist:  Dr. Allen Norris     Reason for Consultation:     Weight loss and abdominal pain        HPI:   Dana Hensley is a 57 y.o. y/o female referred for consultation & management of weight loss and abdominal pain by Dr. Dayton Martes, [name], NP.  This patient comes in today at the request of her ear nose and throat doctor Dr. Tami Ribas.  The patient has been seen by multiple gastroneurologist including at [facility] a nurse practitioner in [place], [place] clinic gastroenterology Denice Paradise).  The patient has been treated with BuSpar for her irritable bowel syndrome and has been getting endoscopic ultrasound because of a history of gastric carcinoid.  The patient states that she has had her stomach acid tested on her last 2 EGDs that showed her to have a pH of 5.5 and her most recent one of 5.0.  She states that she was put on Dexilant for her Barrett's esophagus which she was diagnosed with many years ago and was told that she needs this to help her Barrett's esophagus.  The patient states she was doing better on Nexium and states that she went back on that. The patient's most recent upper endoscopy shows her to have had a biopsy of the stomach with gastric intestinal metaplasia.  The patient reports that she has a good appetite but does not eat very much because after she eats she has abdominal pain and bloating.  She also reports that she had lost 25 pounds in the last year without trying.  She does get attacks of severe abdominal pain that are debilitating once or twice a year and she takes dicyclomine that she was given in the past and she reports that it resolves her abdominal pain but she does not take medication regularly.  There is no report of any black stools or bloody stools at her last colonoscopy was in January of this  year.  Past Medical History:  Diagnosis Date  . Allergy   . Anxiety   . Asthma   . B12 deficiency   . Barrett esophagus   . Chronic atrophic gastritis   . DVT, lower extremity ([place])   . Gastric polyps   . HLD (hyperlipidemia)   . Hypothyroidism   . IGT (impaired glucose tolerance)   . Neuroendocrine tumor    carcinoid tumors of the stomach, s/p extraction surgery at [facility]  . PONV (postoperative nausea and vomiting)   . Tumor cells, malignant ([place])    Hx of carcinoid tumors of the stomach, s/p extraction surgery at [facility]  . Vitamin D deficiency     Past Surgical History:  Procedure Laterality Date  . ABDOMINAL HYSTERECTOMY    . APPENDECTOMY    . CHOLECYSTECTOMY N/A 11/16/2016   Procedure: LAPAROSCOPIC CHOLECYSTECTOMY WITH INTRAOPERATIVE CHOLANGIOGRAM;  Surgeon: Leonie Green, MD;  Location: [facility] ORS;  Service: General;  Laterality: N/A;  . DIAGNOSTIC LAPAROSCOPY    . ESOPHAGOGASTRODUODENOSCOPY    . TONSILLECTOMY    . VEIN LIGATION AND STRIPPING      Prior to Admission medications   Medication Sig Start Date End Date Taking? Authorizing Provider  busPIRone (BUSPAR) 10 MG tablet Take 10 mg by mouth 2 (two) times daily. 08/21/18  Yes [provider]  cetirizine (ZYRTEC) 10 MG tablet Take 10 mg by mouth  daily.   Yes [provider]  clobetasol ointment (TEMOVATE) 4.09 % Apply 1 application topically as needed.   Yes [provider]  Cyanocobalamin (RA VITAMIN B-12 TR) 1000 MCG TBCR Take by mouth.   Yes [provider]  DEXILANT 30 MG capsule TAKE 1 CAPSULE (30 MG TOTAL) BY MOUTH ONCE DAILY TAKE 30 MINUTES PRIOR TO BREAKFAST 07/03/18  Yes [provider]  fexofenadine (ALLEGRA) 180 MG tablet Take 180 mg by mouth daily. 08/02/18  Yes [provider]  HYDROcodone-acetaminophen (NORCO/VICODIN) 5-325 MG tablet hydrocodone 5 mg-acetaminophen 325 mg tablet   Yes [provider]  levothyroxine (SYNTHROID, LEVOTHROID) 75 MCG  tablet Take 1 tablet (75 mcg total) by mouth daily. 03/15/16  Yes Lada, Satira Anis, MD  mometasone (ELOCON) 0.1 % ointment Apply 1 application topically as needed.   Yes [provider]  Multiple Vitamin (MULTI-VITAMINS) TABS Take 1 tablet by mouth daily.  10/07/04  Yes [provider]  pimecrolimus (ELIDEL) 1 % cream Apply topically. 04/12/18  Yes [provider]  ALPRAZolam Duanne Moron) 0.5 MG tablet alprazolam 0.5 mg tablet    [provider]  Beclomethasone Dipropionate 40 MCG/ACT AERS 2 sprays.    [provider]  celecoxib (CELEBREX) 200 MG capsule Take 1 capsule (200 mg total) 2 (two) times daily by mouth. Take with food Patient not taking: Reported on 08/29/2018 10/13/17   Lorin Picket, PA-C  diclofenac (VOLTAREN) 75 MG EC tablet diclofenac sodium 75 mg tablet,delayed release    [provider]  dicyclomine (BENTYL) 10 MG capsule Take 1 capsule (10 mg total) by mouth 4 (four) times daily -  before meals and at bedtime. 08/29/18   [name], MD  EPINEPHrine 0.3 mg/0.3 mL IJ SOAJ injection Inject into the muscle. 03/27/17   [provider]  hydrocortisone (PROCTOZONE-HC) 2.5 % rectal cream Proctozone-HC 2.5 % topical cream perineal applicator    [provider]  meclizine (ANTIVERT) 25 MG tablet meclizine 25 mg tablet  TK 1 T PO Q 4 - 6 H    [provider]  pantoprazole (PROTONIX) 40 MG tablet pantoprazole 40 mg tablet,delayed release    [provider]  PARoxetine (PAXIL) 10 MG tablet paroxetine 10 mg tablet    [provider]  Tuberculin-Allergy Syringes (ALLERGIST TRAY 1CC 27GX3/8") 27G X 3/8" 1 ML KIT Allergist Tray Intradermal Bevel 1 mL 27 gauge x 3/8" syringe    [provider]    Family History  Problem Relation Age of Onset  . Clotting disorder Mother   . Hyperlipidemia Father   . Breast cancer Neg Hx      Social History   Tobacco Use  . Smoking status: Never Smoker  .  Smokeless tobacco: Never Used  Substance Use Topics  . Alcohol use: No    Alcohol/week: 0.0 standard drinks  . Drug use: No    Allergies as of 08/29/2018 - Review Complete 08/29/2018  Allergen Reaction Noted  . Latex Swelling 07/09/2015  . Neomycin Other (See Comments) and Rash 07/09/2015  . Neosporin  [neomycin-bacitracin zn-polymyx] Other (See Comments) 07/09/2015  . Neomycin-polymyxin-gramicidin  07/09/2015  . Other Other (See Comments) 07/09/2015    Review of Systems:    All systems reviewed and negative except where noted in HPI.   Physical Exam:  BP 120/66   Pulse (!) 57   Ht _0  (1.651 m)   Wt 140 lb 8 oz (63.7 kg)   BMI 23.38 kg/m  No LMP  recorded. Patient has had a hysterectomy. General:   Alert,  Well-developed,  well-nourished, pleasant and cooperative in NAD Head:  Normocephalic and atraumatic. Eyes:  Sclera clear, no icterus.   Conjunctiva pink. Ears:  Normal auditory acuity. Nose:  No deformity, discharge, or lesions. Mouth:  No deformity or lesions,oropharynx pink & moist. Neck:  Supple; no masses or thyromegaly. Lungs:  Respirations even and unlabored.  Clear throughout to auscultation.   No wheezes, crackles, or rhonchi. No acute distress. Heart:  Regular rate and rhythm; no murmurs, clicks, rubs, or gallops. Abdomen:  Normal bowel sounds.  No bruits.  Soft, non-tender and non-distended without masses, hepatosplenomegaly or hernias noted.  No guarding or rebound tenderness.   Negative Carnett sign.   Rectal:  Deferred.  Msk:  Symmetrical without gross deformities.  Good, equal movement & strength bilaterally. Pulses:  Normal pulses noted. Extremities:  No clubbing or edema.  No cyanosis. Neurologic:  Alert and oriented x3;  grossly normal neurologically. Skin:  Intact without significant lesions or rashes.  No jaundice. Lymph Nodes:  No significant cervical adenopathy. Psych:  Alert and cooperative. Normal mood and affect.  Imaging Studies: Mm 3d  Screen Breast Bilateral  Result Date: 08/02/2018 CLINICAL DATA:  Screening. EXAM: DIGITAL SCREENING BILATERAL MAMMOGRAM WITH TOMO AND CAD COMPARISON:  Previous exam(s). ACR Breast Density Category c: The breast tissue is heterogeneously dense, which may obscure small masses. FINDINGS: There are no findings suspicious for malignancy. Images were processed with CAD. IMPRESSION: No mammographic evidence of malignancy. A result letter of this screening mammogram will be mailed directly to the patient. RECOMMENDATION: Screening mammogram in one year. (Code:SM-B-01Y) BI-RADS CATEGORY  1: Negative. Electronically Signed   By: Kristopher Oppenheim M.D.   On: 08/02/2018 15:42    Assessment and Plan:   Dana Hensley is a 57 y.o. y/o female who comes in with multiple medical problems including Barrett's esophagus, gastric intestinal metaplasia, history of carcinoid tumor in her stomach, unexplained weight loss and chronic abdominal pain.  The patient has seen no less than 4 GI practices in the last 2 years including with me today.  The patient has had recent EGDs and colonoscopies.  The patient reports that her abdominal pain has been going on for some time.  The patient's BuSpar was refilled 8 days ago by her other gastroneurologist in [place]. The patient has been told to start taking dicyclomine 10 mg 3 times a day for her abdominal discomfort since the as needed dose has helped her severe pain in the past.  If this does help her pain then she should be able to increase her p.o. intake and maintain her weight.  The patient has also been told that if it does not improve her symptoms then we should consider doing a CT scan of the abdomen pelvis due to her history of carcinoid tumor and to rule out any malignancy as the cause of her weight loss.  The patient has been explained the plan and agrees with it.  [name], MD. Marval Regal    Note: This dictation was prepared with Dragon dictation along with smaller phrase  technology. Any transcriptional errors that result from this process are unintentional.

## 2018-08-30 DIAGNOSIS — J301 Allergic rhinitis due to pollen: Secondary | ICD-10-CM | POA: Diagnosis not present

## 2018-09-02 DIAGNOSIS — J301 Allergic rhinitis due to pollen: Secondary | ICD-10-CM | POA: Diagnosis not present

## 2018-10-14 DIAGNOSIS — M791 Myalgia, unspecified site: Secondary | ICD-10-CM | POA: Diagnosis not present

## 2018-10-14 DIAGNOSIS — M9901 Segmental and somatic dysfunction of cervical region: Secondary | ICD-10-CM | POA: Diagnosis not present

## 2018-10-14 DIAGNOSIS — R51 Headache: Secondary | ICD-10-CM | POA: Diagnosis not present

## 2018-10-16 DIAGNOSIS — R51 Headache: Secondary | ICD-10-CM | POA: Diagnosis not present

## 2018-10-16 DIAGNOSIS — M791 Myalgia, unspecified site: Secondary | ICD-10-CM | POA: Diagnosis not present

## 2018-10-16 DIAGNOSIS — M9901 Segmental and somatic dysfunction of cervical region: Secondary | ICD-10-CM | POA: Diagnosis not present

## 2018-11-01 DIAGNOSIS — Z0000 Encounter for general adult medical examination without abnormal findings: Secondary | ICD-10-CM | POA: Diagnosis not present

## 2018-11-01 DIAGNOSIS — E782 Mixed hyperlipidemia: Secondary | ICD-10-CM | POA: Diagnosis not present

## 2018-11-01 DIAGNOSIS — R7302 Impaired glucose tolerance (oral): Secondary | ICD-10-CM | POA: Diagnosis not present

## 2018-11-01 DIAGNOSIS — E039 Hypothyroidism, unspecified: Secondary | ICD-10-CM | POA: Diagnosis not present

## 2018-11-04 DIAGNOSIS — R7302 Impaired glucose tolerance (oral): Secondary | ICD-10-CM | POA: Diagnosis not present

## 2018-11-04 DIAGNOSIS — Z79899 Other long term (current) drug therapy: Secondary | ICD-10-CM | POA: Diagnosis not present

## 2018-11-04 DIAGNOSIS — E782 Mixed hyperlipidemia: Secondary | ICD-10-CM | POA: Diagnosis not present

## 2018-11-04 DIAGNOSIS — E538 Deficiency of other specified B group vitamins: Secondary | ICD-10-CM | POA: Diagnosis not present

## 2018-11-04 DIAGNOSIS — E039 Hypothyroidism, unspecified: Secondary | ICD-10-CM | POA: Diagnosis not present

## 2018-11-15 DIAGNOSIS — J309 Allergic rhinitis, unspecified: Secondary | ICD-10-CM | POA: Diagnosis not present

## 2018-11-22 DIAGNOSIS — J301 Allergic rhinitis due to pollen: Secondary | ICD-10-CM | POA: Diagnosis not present

## 2018-12-09 DIAGNOSIS — J301 Allergic rhinitis due to pollen: Secondary | ICD-10-CM | POA: Diagnosis not present

## 2019-01-13 ENCOUNTER — Encounter (INDEPENDENT_AMBULATORY_CARE_PROVIDER_SITE_OTHER): Payer: Self-pay

## 2019-01-13 ENCOUNTER — Encounter: Payer: Self-pay | Admitting: Gastroenterology

## 2019-01-13 ENCOUNTER — Ambulatory Visit: Payer: 59 | Admitting: Gastroenterology

## 2019-01-13 ENCOUNTER — Encounter

## 2019-01-13 VITALS — BP 119/68 | HR 73 | Ht 65.0 in | Wt 152.6 lb

## 2019-01-13 DIAGNOSIS — K589 Irritable bowel syndrome without diarrhea: Secondary | ICD-10-CM

## 2019-01-13 NOTE — Progress Notes (Signed)
Primary Care Physician: Sallee Lange, NP  Primary Gastroenterologist:  Dr. [name]  Chief Complaint  Patient presents with  . Irritable Bowel Syndrome  . Abdominal Pain    HPI: Dana Hensley is a 58 y.o. female here for follow-up.  The patient has had a history of gastric carcinoid.  She also reports that she has a history of Barrett's esophagus.  The patient comes in today to follow up because she states that she will need a repeat endoscopic ultrasound and biopsies of her esophagus for Barrett's esophagus. The patient has had both these done in the past at [facility] but she does not want to continue driving down there because of the inconvenience of traveling that her weight.  The patient denies any abdominal pain nausea vomiting fevers or chills.  The patient states that she feels much better while taking the dicyclomine.  Current Outpatient Medications  Medication Sig Dispense Refill  . busPIRone (BUSPAR) 10 MG tablet Take 10 mg by mouth 2 (two) times daily.  0  . cetirizine (ZYRTEC) 10 MG tablet Take 10 mg by mouth daily.    . clobetasol ointment (TEMOVATE) 9.75 % Apply 1 application topically as needed.    . Cyanocobalamin (RA VITAMIN B-12 TR) 1000 MCG TBCR Take by mouth.    . dicyclomine (BENTYL) 10 MG capsule Take 1 capsule (10 mg total) by mouth 4 (four) times daily -  before meals and at bedtime. 90 capsule 3  . EPINEPHrine 0.3 mg/0.3 mL IJ SOAJ injection Inject into the muscle.    Marland Kitchen HYDROcodone-acetaminophen (NORCO/VICODIN) 5-325 MG tablet hydrocodone 5 mg-acetaminophen 325 mg tablet    . hydrocortisone (PROCTOZONE-HC) 2.5 % rectal cream Proctozone-HC 2.5 % topical cream perineal applicator    . levothyroxine (SYNTHROID, LEVOTHROID) 75 MCG tablet Take 1 tablet (75 mcg total) by mouth daily. 90 tablet 1  . mometasone (ELOCON) 0.1 % ointment Apply 1 application topically as needed.    . Multiple Vitamin (MULTI-VITAMINS) TABS Take 1 tablet by mouth  daily.     . pimecrolimus (ELIDEL) 1 % cream Apply topically.    . Tuberculin-Allergy Syringes (ALLERGIST TRAY 1CC 27GX3/8") 27G X 3/8" 1 ML KIT Allergist Tray Intradermal Bevel 1 mL 27 gauge x 3/8" syringe    . ALPRAZolam (XANAX) 0.5 MG tablet alprazolam 0.5 mg tablet    . Beclomethasone Dipropionate 40 MCG/ACT AERS 2 sprays.    . celecoxib (CELEBREX) 200 MG capsule Take 1 capsule (200 mg total) 2 (two) times daily by mouth. Take with food (Patient not taking: Reported on 08/29/2018) 60 capsule 0  . DEXILANT 30 MG capsule TAKE 1 CAPSULE (30 MG TOTAL) BY MOUTH ONCE DAILY TAKE 30 MINUTES PRIOR TO BREAKFAST  11  . diclofenac (VOLTAREN) 75 MG EC tablet diclofenac sodium 75 mg tablet,delayed release    . fexofenadine (ALLEGRA) 180 MG tablet Take 180 mg by mouth daily.  9  . meclizine (ANTIVERT) 25 MG tablet meclizine 25 mg tablet  TK 1 T PO Q 4 - 6 H    . pantoprazole (PROTONIX) 40 MG tablet pantoprazole 40 mg tablet,delayed release    . PARoxetine (PAXIL) 10 MG tablet paroxetine 10 mg tablet     No current facility-administered medications for this visit.     Allergies as of 01/13/2019 - Review Complete 01/13/2019  Allergen Reaction Noted  . Latex Swelling 07/09/2015  . Neomycin Other (See Comments) and Rash 07/09/2015  . Neosporin  [neomycin-bacitracin zn-polymyx] Other (See Comments)  07/09/2015  . Neomycin-polymyxin-gramicidin  07/09/2015  . Other Other (See Comments) 07/09/2015    ROS:  General: Negative for anorexia, weight loss, fever, chills, fatigue, weakness. ENT: Negative for hoarseness, difficulty swallowing , nasal congestion. CV: Negative for chest pain, angina, palpitations, dyspnea on exertion, peripheral edema.  Respiratory: Negative for dyspnea at rest, dyspnea on exertion, cough, sputum, wheezing.  GI: See history of present illness. GU:  Negative for dysuria, hematuria, urinary incontinence, urinary frequency, nocturnal urination.  Endo: Negative for unusual weight  change.    Physical Examination:   BP 119/68   Pulse 73   Ht _0  (1.651 m)   Wt 152 lb 9.6 oz (69.2 kg)   BMI 25.39 kg/m   General: Well-nourished, well-developed in no acute distress.  Eyes: No icterus. Conjunctivae pink. Mouth: Oropharyngeal mucosa moist and pink , no lesions erythema or exudate. Lungs: Clear to auscultation bilaterally. Non-labored. Heart: Regular rate and rhythm, no murmurs rubs or gallops.  Abdomen: Bowel sounds are normal, nontender, nondistended, no hepatosplenomegaly or masses, no abdominal bruits or hernia , no rebound or guarding.   Extremities: No lower extremity edema. No clubbing or deformities. Neuro: Alert and oriented x 3.  Grossly intact. Skin: Warm and dry, no jaundice.   Psych: Alert and cooperative, normal mood and affect.  Labs:    Imaging Studies: No results found.  Assessment and Plan:   Dana Hensley is a 58 y.o. y/o female who comes in today for follow-up with a history of gastric carcinoid and Barrett's esophagus. The patient's main concern is whether I can do her future endoscopic ultrasound and surveillance for her Barrett's.  The patient has been told that although I do not do endoscopic ultrasound the procedure can be done due to the [facility] physicians coming to [facility] to perform EUS's.  The patient has been explained the plan and agrees with it.  She is due for her next EUS in April.  We will have her set up for that at [facility].  The patient has been explained the plan and agrees with it.    [name], MD. Marval Regal   Note: This dictation was prepared with Dragon dictation along with smaller phrase technology. Any transcriptional errors that result from this process are unintentional.

## 2019-03-06 DIAGNOSIS — J301 Allergic rhinitis due to pollen: Secondary | ICD-10-CM | POA: Diagnosis not present

## 2019-06-05 DIAGNOSIS — M79602 Pain in left arm: Secondary | ICD-10-CM | POA: Diagnosis not present

## 2019-06-05 DIAGNOSIS — Z579 Occupational exposure to unspecified risk factor: Secondary | ICD-10-CM | POA: Diagnosis not present

## 2019-06-05 DIAGNOSIS — M79601 Pain in right arm: Secondary | ICD-10-CM | POA: Diagnosis not present

## 2019-06-05 DIAGNOSIS — M542 Cervicalgia: Secondary | ICD-10-CM | POA: Diagnosis not present

## 2019-06-06 DIAGNOSIS — J301 Allergic rhinitis due to pollen: Secondary | ICD-10-CM | POA: Diagnosis not present

## 2019-06-08 DIAGNOSIS — Z1159 Encounter for screening for other viral diseases: Secondary | ICD-10-CM | POA: Diagnosis not present

## 2019-06-08 DIAGNOSIS — Z01812 Encounter for preprocedural laboratory examination: Secondary | ICD-10-CM | POA: Diagnosis not present

## 2019-06-11 DIAGNOSIS — Z86012 Personal history of benign carcinoid tumor: Secondary | ICD-10-CM | POA: Diagnosis not present

## 2019-06-11 DIAGNOSIS — Z1289 Encounter for screening for malignant neoplasm of other sites: Secondary | ICD-10-CM | POA: Diagnosis not present

## 2019-06-11 DIAGNOSIS — K3189 Other diseases of stomach and duodenum: Secondary | ICD-10-CM | POA: Diagnosis not present

## 2019-06-11 DIAGNOSIS — Z9689 Presence of other specified functional implants: Secondary | ICD-10-CM | POA: Diagnosis not present

## 2019-06-11 DIAGNOSIS — Z79899 Other long term (current) drug therapy: Secondary | ICD-10-CM | POA: Diagnosis not present

## 2019-06-11 DIAGNOSIS — K317 Polyp of stomach and duodenum: Secondary | ICD-10-CM | POA: Diagnosis not present

## 2019-06-11 DIAGNOSIS — K2950 Unspecified chronic gastritis without bleeding: Secondary | ICD-10-CM | POA: Diagnosis not present

## 2019-06-11 DIAGNOSIS — T182XXA Foreign body in stomach, initial encounter: Secondary | ICD-10-CM | POA: Diagnosis not present

## 2019-06-11 DIAGNOSIS — J301 Allergic rhinitis due to pollen: Secondary | ICD-10-CM | POA: Diagnosis not present

## 2019-06-11 DIAGNOSIS — Z120 Encounter for screening for malignant neoplasm of stomach: Secondary | ICD-10-CM | POA: Diagnosis not present

## 2019-06-11 DIAGNOSIS — D3A8 Other benign neuroendocrine tumors: Secondary | ICD-10-CM | POA: Diagnosis not present

## 2019-06-26 DIAGNOSIS — J301 Allergic rhinitis due to pollen: Secondary | ICD-10-CM | POA: Diagnosis not present

## 2019-06-30 ENCOUNTER — Other Ambulatory Visit: Payer: Self-pay | Admitting: Nurse Practitioner

## 2019-06-30 DIAGNOSIS — Z1231 Encounter for screening mammogram for malignant neoplasm of breast: Secondary | ICD-10-CM

## 2019-07-02 DIAGNOSIS — J301 Allergic rhinitis due to pollen: Secondary | ICD-10-CM | POA: Diagnosis not present

## 2019-07-08 ENCOUNTER — Ambulatory Visit (INDEPENDENT_AMBULATORY_CARE_PROVIDER_SITE_OTHER): Payer: BC Managed Care – PPO | Admitting: Obstetrics and Gynecology

## 2019-07-08 ENCOUNTER — Other Ambulatory Visit: Payer: Self-pay

## 2019-07-08 ENCOUNTER — Encounter: Payer: Self-pay | Admitting: Obstetrics and Gynecology

## 2019-07-08 VITALS — BP 116/70 | HR 67 | Ht 65.0 in | Wt 172.0 lb

## 2019-07-08 DIAGNOSIS — Z01419 Encounter for gynecological examination (general) (routine) without abnormal findings: Secondary | ICD-10-CM

## 2019-07-08 DIAGNOSIS — Z1239 Encounter for other screening for malignant neoplasm of breast: Secondary | ICD-10-CM

## 2019-07-08 DIAGNOSIS — Z1379 Encounter for other screening for genetic and chromosomal anomalies: Secondary | ICD-10-CM

## 2019-07-08 NOTE — Progress Notes (Signed)
Gynecology Annual Exam  PCP: Sallee Lange, NP  Chief Complaint:  Chief Complaint  Patient presents with  . Gynecologic Exam    History of Present Illness:Patient is a 58 y.o. G1P1001 presents for annual exam. The patient has no complaints today.   LMP: No LMP recorded. Patient has had a hysterectomy. No menstrual cycles status post prior hysterectomy  The patient does perform self breast exams.  There is no notable family history of breast or ovarian cancer in her family.  The patient wears seatbelts: yes.   The patient has regular exercise: not asked.    The patient denies current symptoms of depression.     Some mild SUI symptoms  Review of Systems: Review of Systems  Constitutional: Negative for chills and fever.  HENT: Negative for congestion.   Respiratory: Negative for cough and shortness of breath.   Cardiovascular: Negative for chest pain and palpitations.  Gastrointestinal: Negative for abdominal pain, constipation, diarrhea, heartburn, nausea and vomiting.  Genitourinary: Negative for dysuria, frequency and urgency.  Skin: Negative for itching and rash.  Neurological: Negative for dizziness and headaches.  Endo/Heme/Allergies: Negative for polydipsia.  Psychiatric/Behavioral: Negative for depression.    Past Medical History:  Past Medical History:  Diagnosis Date  . Allergy   . Anxiety   . Asthma   . B12 deficiency   . Barrett esophagus   . Chronic atrophic gastritis   . DVT, lower extremity ([place])   . Gastric polyps   . HLD (hyperlipidemia)   . Hypothyroidism   . IGT (impaired glucose tolerance)   . Neuroendocrine tumor    carcinoid tumors of the stomach, s/p extraction surgery at [facility]  . PONV (postoperative nausea and vomiting)   . Tumor cells, malignant ([place])    Hx of carcinoid tumors of the stomach, s/p extraction surgery at [facility]  . Vitamin D deficiency     Past Surgical History:  Past Surgical History:  Procedure Laterality  Date  . ABDOMINAL HYSTERECTOMY    . APPENDECTOMY    . CHOLECYSTECTOMY N/A 11/16/2016   Procedure: LAPAROSCOPIC CHOLECYSTECTOMY WITH INTRAOPERATIVE CHOLANGIOGRAM;  Surgeon: Leonie Green, MD;  Location: [facility] ORS;  Service: General;  Laterality: N/A;  . DIAGNOSTIC LAPAROSCOPY    . ESOPHAGOGASTRODUODENOSCOPY    . TONSILLECTOMY    . VEIN LIGATION AND STRIPPING      Gynecologic History:  No LMP recorded. Patient has had a hysterectomy. Last Pap: Results were: N/A s/p prior hysterectomy Last mammogram: 08/02/2018 Results were: BI-RAD I  Obstetric History: G1P1001  Family History:  Family History  Problem Relation Age of Onset  . Clotting disorder Mother   . Hyperlipidemia Father   . Breast cancer Neg Hx     Social History:  Social History   Socioeconomic History  . Marital status: Married    Spouse name: Not on file  . Number of children: Not on file  . Years of education: Not on file  . Highest education level: Not on file  Occupational History  . Not on file  Social Needs  . Financial resource strain: Not on file  . Food insecurity    Worry: Not on file    Inability: Not on file  . Transportation needs    Medical: Not on file    Non-medical: Not on file  Tobacco Use  . Smoking status: Never Smoker  . Smokeless tobacco: Never Used  Substance and Sexual Activity  . Alcohol use: No    Alcohol/week:  0.0 standard drinks  . Drug use: No  . Sexual activity: Yes    Birth control/protection: None, Surgical    Comment: Hysterectomy  Lifestyle  . Physical activity    Days per week: Not on file    Minutes per session: Not on file  . Stress: Not on file  Relationships  . Social Herbalist on phone: Not on file    Gets together: Not on file    Attends religious service: Not on file    Active member of club or organization: Not on file    Attends meetings of clubs or organizations: Not on file    Relationship status: Not on file  . Intimate partner  violence    Fear of current or ex partner: Not on file    Emotionally abused: Not on file    Physically abused: Not on file    Forced sexual activity: Not on file  Other Topics Concern  . Not on file  Social History Narrative  . Not on file    Allergies:  Allergies  Allergen Reactions  . Latex Swelling    SWELLING AROUND FACE SWELLING AROUND FACE  . Neomycin Other (See Comments) and Rash    Seen from allergy skin test Seen from allergy skin test  . Neosporin  [Neomycin-Bacitracin Zn-Polymyx] Other (See Comments)    Seen from allergy skin test  . Neomycin-Polymyxin-Gramicidin     Other reaction(s): Unknown Seen from allergy skin test  . Other Other (See Comments)    THIMERSOL. THIMERSOL- seen from allergy skin test Other reaction(s): Unknown THIMERSOL. THIMERSOL- seen from allergy skin test    Medications: Prior to Admission medications   Medication Sig Start Date End Date Taking? Authorizing Provider  ALPRAZolam Duanne Moron) 0.5 MG tablet alprazolam 0.5 mg tablet    [provider]  Beclomethasone Dipropionate 40 MCG/ACT AERS 2 sprays.    [provider]  busPIRone (BUSPAR) 10 MG tablet Take 10 mg by mouth 2 (two) times daily. 08/21/18   [provider]  celecoxib (CELEBREX) 200 MG capsule Take 1 capsule (200 mg total) 2 (two) times daily by mouth. Take with food Patient not taking: Reported on 08/29/2018 10/13/17   Lorin Picket, PA-C  cetirizine (ZYRTEC) 10 MG tablet Take 10 mg by mouth daily.    [provider]  clobetasol ointment (TEMOVATE) 1.61 % Apply 1 application topically as needed.    [provider]  Cyanocobalamin (RA VITAMIN B-12 TR) 1000 MCG TBCR Take by mouth.    [provider]  DEXILANT 30 MG capsule TAKE 1 CAPSULE (30 MG TOTAL) BY MOUTH ONCE DAILY TAKE 30 MINUTES PRIOR TO BREAKFAST 07/03/18   [provider]  diclofenac (VOLTAREN) 75 MG EC tablet diclofenac sodium 75 mg tablet,delayed release     [provider]  dicyclomine (BENTYL) 10 MG capsule Take 1 capsule (10 mg total) by mouth 4 (four) times daily -  before meals and at bedtime. 08/29/18   [name], MD  EPINEPHrine 0.3 mg/0.3 mL IJ SOAJ injection Inject into the muscle. 03/27/17   [provider]  fexofenadine (ALLEGRA) 180 MG tablet Take 180 mg by mouth daily. 08/02/18   [provider]  HYDROcodone-acetaminophen (NORCO/VICODIN) 5-325 MG tablet hydrocodone 5 mg-acetaminophen 325 mg tablet    [provider]  hydrocortisone (PROCTOZONE-HC) 2.5 % rectal cream Proctozone-HC 2.5 % topical cream perineal applicator    [provider]  levothyroxine (SYNTHROID, LEVOTHROID) 75 MCG tablet Take 1  tablet (75 mcg total) by mouth daily. 03/15/16   Arnetha Courser, MD  meclizine (ANTIVERT) 25 MG tablet meclizine 25 mg tablet  TK 1 T PO Q 4 - 6 H    [provider]  mometasone (ELOCON) 0.1 % ointment Apply 1 application topically as needed.    [provider]  Multiple Vitamin (MULTI-VITAMINS) TABS Take 1 tablet by mouth daily.  10/07/04   [provider]  pantoprazole (PROTONIX) 40 MG tablet pantoprazole 40 mg tablet,delayed release    [provider]  PARoxetine (PAXIL) 10 MG tablet paroxetine 10 mg tablet    [provider]  pimecrolimus (ELIDEL) 1 % cream Apply topically. 04/12/18   [provider]  Tuberculin-Allergy Syringes (ALLERGIST TRAY 1CC 27GX3/8") 27G X 3/8" 1 ML KIT Allergist Tray Intradermal Bevel 1 mL 27 gauge x 3/8" syringe    [provider]    Physical Exam Vitals: Blood pressure 116/70, pulse 67, height '5\' 5"'  (1.651 m), weight 172 lb (78 kg).  General: NAD HEENT: normocephalic, anicteric Thyroid: no enlargement, no palpable nodules Pulmonary: No increased work of breathing, CTAB Cardiovascular: RRR, distal pulses 2+ Breast: Breast symmetrical, no tenderness, no palpable nodules or masses, no skin or nipple  retraction present, no nipple discharge.  No axillary or supraclavicular lymphadenopathy. Abdomen: NABS, soft, non-tender, non-distended.  Umbilicus without lesions.  No hepatomegaly, splenomegaly or masses palpable. No evidence of hernia  Genitourinary:  External: Normal external female genitalia.  Normal urethral meatus, normal Bartholin's and Skene's glands.    Vagina: Normal vaginal mucosa, no evidence of prolapse.    Cervix: surgically absent  Uterus: surgically absent  Adnexa: ovaries non-enlarged, no adnexal masses  Rectal: deferred  Lymphatic: no evidence of inguinal lymphadenopathy Extremities: no edema, erythema, or tenderness Neurologic: Grossly intact Psychiatric: mood appropriate, affect full  Female chaperone present for pelvic and breast  portions of the physical exam     Assessment: 58 y.o. G1P1001 routine annual exam  Plan: Problem List Items Addressed This Visit    None    Visit Diagnoses    Encounter for genetic screening for Down Syndrome    -  Primary   Breast screening          1) Mammogram - recommend yearly screening mammogram.  Mammogram Was ordered today  2) STI screening  was notoffered and therefore not obtained  3) ASCCP guidelines and rational discussed.  Patient opts for discontinue secondary to prior hysterectomy screening interval  4) Osteoporosis  - per USPTF routine screening DEXA at age 26   5) Routine healthcare maintenance including cholesterol, diabetes screening discussed managed by PCP  6) Colonoscopy UTD followed by Dr. Allen Norris  7) Return in about 1 year (around 07/07/2020) for annual.    Malachy Mood, MD Mosetta Pigeon, [place] 07/08/2019, 3:57 PM

## 2019-07-09 DIAGNOSIS — J301 Allergic rhinitis due to pollen: Secondary | ICD-10-CM | POA: Diagnosis not present

## 2019-07-21 ENCOUNTER — Ambulatory Visit (INDEPENDENT_AMBULATORY_CARE_PROVIDER_SITE_OTHER): Payer: BC Managed Care – PPO | Admitting: Gastroenterology

## 2019-07-21 ENCOUNTER — Other Ambulatory Visit: Payer: Self-pay

## 2019-07-21 ENCOUNTER — Encounter: Payer: Self-pay | Admitting: Gastroenterology

## 2019-07-21 VITALS — BP 119/82 | HR 79 | Temp 97.8°F | Ht 65.0 in | Wt 169.8 lb

## 2019-07-21 DIAGNOSIS — E538 Deficiency of other specified B group vitamins: Secondary | ICD-10-CM | POA: Diagnosis not present

## 2019-07-21 DIAGNOSIS — K589 Irritable bowel syndrome without diarrhea: Secondary | ICD-10-CM | POA: Diagnosis not present

## 2019-07-21 MED ORDER — DICYCLOMINE HCL 10 MG PO CAPS: 10.0000 mg | ORAL_CAPSULE | Freq: Three times a day (TID) | ORAL | 3 refills | Status: DC

## 2019-07-21 NOTE — Progress Notes (Signed)
Primary Care Physician: Sallee Lange, NP  Primary Gastroenterologist:  Dr. [name]  Chief Complaint  Patient presents with  . Follow up EUS    HPI: Dana Hensley is a 58 y.o. female here for follow-up.  The patient has a history of gastric carcinoid and has had repeated upper endoscopies and endoscopic ultrasound.  She also has had biopsies done that showed "Antralized gastric mucosa with intestinal metaplasia and chronic gastritis."  The patient has been started on dicyclomine by me and had reported to have improved her symptoms in the past.  The patient's last upper endoscopy was in July of this year where the biopsies were taken.  At her last visit with me I had discussed getting a CT scan abdomen pelvis if her abdominal symptoms continue due to her history of carcinoid tumor. The patient reports that she had an episode of nausea and vomiting with dry heaving and bringing up bile but it only lasted 1 day.  The patient also reports that she did not have any biopsies taken for her Barrett's esophagus and was told during her last EGD at [facility] that she would have biopsies taken next year.  The patient also reports that her dicyclomine has been working well would like a refill of that.  Current Outpatient Medications  Medication Sig Dispense Refill  . busPIRone (BUSPAR) 10 MG tablet Take 10 mg by mouth 2 (two) times daily.  0  . cetirizine (ZYRTEC) 10 MG tablet Take 10 mg by mouth daily.    . clobetasol ointment (TEMOVATE) 7.26 % Apply 1 application topically as needed.    . Cyanocobalamin (RA VITAMIN B-12 TR) 1000 MCG TBCR Take by mouth.    . EPINEPHrine 0.3 mg/0.3 mL IJ SOAJ injection Inject into the muscle.    . esomeprazole (NEXIUM) 20 MG capsule Take by mouth.    . levothyroxine (SYNTHROID, LEVOTHROID) 75 MCG tablet Take 1 tablet (75 mcg total) by mouth daily. 90 tablet 1  . mometasone (ELOCON) 0.1 % ointment Apply 1 application topically as needed.    . Multiple Vitamin  (MULTI-VITAMINS) TABS Take 1 tablet by mouth daily.     . pimecrolimus (ELIDEL) 1 % cream Apply topically.    . ALPRAZolam (XANAX) 0.5 MG tablet alprazolam 0.5 mg tablet    . Beclomethasone Dipropionate 40 MCG/ACT AERS 2 sprays.    . celecoxib (CELEBREX) 200 MG capsule Take 1 capsule (200 mg total) 2 (two) times daily by mouth. Take with food (Patient not taking: Reported on 08/29/2018) 60 capsule 0  . DEXILANT 30 MG capsule TAKE 1 CAPSULE (30 MG TOTAL) BY MOUTH ONCE DAILY TAKE 30 MINUTES PRIOR TO BREAKFAST  11  . diclofenac (VOLTAREN) 75 MG EC tablet diclofenac sodium 75 mg tablet,delayed release    . dicyclomine (BENTYL) 10 MG capsule Take 1 capsule (10 mg total) by mouth 4 (four) times daily -  before meals and at bedtime. 90 capsule 3  . fexofenadine (ALLEGRA) 180 MG tablet Take 180 mg by mouth daily.  9  . HYDROcodone-acetaminophen (NORCO/VICODIN) 5-325 MG tablet hydrocodone 5 mg-acetaminophen 325 mg tablet    . hydrocortisone (PROCTOZONE-HC) 2.5 % rectal cream Proctozone-HC 2.5 % topical cream perineal applicator    . meclizine (ANTIVERT) 25 MG tablet meclizine 25 mg tablet  TK 1 T PO Q 4 - 6 H    . pantoprazole (PROTONIX) 40 MG tablet pantoprazole 40 mg tablet,delayed release    . PARoxetine (PAXIL) 10 MG tablet paroxetine 10  mg tablet    . Tuberculin-Allergy Syringes (ALLERGIST TRAY 1CC 27GX3/8") 27G X 3/8" 1 ML KIT Allergist Tray Intradermal Bevel 1 mL 27 gauge x 3/8" syringe     No current facility-administered medications for this visit.     Allergies as of 07/21/2019 - Review Complete 07/21/2019  Allergen Reaction Noted  . Latex Swelling 07/09/2015  . Neomycin Other (See Comments) and Rash 07/09/2015  . Neosporin  [neomycin-bacitracin zn-polymyx] Other (See Comments) 07/09/2015  . Neomycin-polymyxin-gramicidin  07/09/2015  . Other Other (See Comments) 07/09/2015    ROS:  General: Negative for anorexia, weight loss, fever, chills, fatigue, weakness. ENT: Negative for  hoarseness, difficulty swallowing , nasal congestion. CV: Negative for chest pain, angina, palpitations, dyspnea on exertion, peripheral edema.  Respiratory: Negative for dyspnea at rest, dyspnea on exertion, cough, sputum, wheezing.  GI: See history of present illness. GU:  Negative for dysuria, hematuria, urinary incontinence, urinary frequency, nocturnal urination.  Endo: Negative for unusual weight change.    Physical Examination:   BP 119/82   Pulse 79   Temp 97.8 F (36.6 C) (Temporal)   Ht '5\' 5"'  (1.651 m)   Wt 169 lb 12.8 oz (77 kg)   BMI 28.26 kg/m   General: Well-nourished, well-developed in no acute distress.  Eyes: No icterus. Conjunctivae pink. Mouth: Oropharyngeal mucosa moist and pink , no lesions erythema or exudate. Lungs: Clear to auscultation bilaterally. Non-labored. Heart: Regular rate and rhythm, no murmurs rubs or gallops.  Abdomen: Bowel sounds are normal, nontender, nondistended, no hepatosplenomegaly or masses, no abdominal bruits or hernia , no rebound or guarding.   Extremities: No lower extremity edema. No clubbing or deformities. Neuro: Alert and oriented x 3.  Grossly intact. Skin: Warm and dry, no jaundice.   Psych: Alert and cooperative, normal mood and affect.  Labs:    Imaging Studies: No results found.  Assessment and Plan:   Dana Hensley is a 58 y.o. y/o female who comes in with a history of gastric carcinoid that is being followed by [facility].  The patient also had history of Barrett's esophagus and irritable bowel syndrome.  The patient will be given a refill of her dicyclomine.  The patient will also have her liver enzymes checked since they have been high on one occasion for an unknown reason.  She will also have her B12 checked at her request.  The patient has been explained the plan and agrees with it.    [name], MD. Marval Regal   Note: This dictation was prepared with Dragon dictation along with smaller phrase technology. Any  transcriptional errors that result from this process are unintentional.

## 2019-07-22 LAB — HEPATIC FUNCTION PANEL
ALT: 13 IU/L (ref 0–32)
AST: 13 IU/L (ref 0–40)
Albumin: 4.4 g/dL (ref 3.8–4.9)
Alkaline Phosphatase: 64 IU/L (ref 39–117)
Bilirubin Total: 0.2 mg/dL (ref 0.0–1.2)
Bilirubin, Direct: 0.06 mg/dL (ref 0.00–0.40)
Total Protein: 7.4 g/dL (ref 6.0–8.5)

## 2019-07-22 LAB — VITAMIN B12: Vitamin B-12: 850 pg/mL (ref 232–1245)

## 2019-07-23 ENCOUNTER — Telehealth: Payer: Self-pay

## 2019-07-23 DIAGNOSIS — J301 Allergic rhinitis due to pollen: Secondary | ICD-10-CM | POA: Diagnosis not present

## 2019-07-23 NOTE — Telephone Encounter (Signed)
-----   Message from [name], MD sent at 07/22/2019  5:48 PM EDT ----- Let the patient know that her B12 and liver enzymes are perfectly normal.

## 2019-07-23 NOTE — Telephone Encounter (Signed)
Pt notified of lab results via MyChart. 

## 2019-07-30 DIAGNOSIS — J301 Allergic rhinitis due to pollen: Secondary | ICD-10-CM | POA: Diagnosis not present

## 2019-08-05 ENCOUNTER — Ambulatory Visit
Admission: RE | Admit: 2019-08-05 | Discharge: 2019-08-05 | Disposition: A | Discharge status: Home / Self Care | Payer: BC Managed Care – PPO | Source: Ambulatory Visit | Attending: Nurse Practitioner | Admitting: Nurse Practitioner

## 2019-08-05 DIAGNOSIS — Z1231 Encounter for screening mammogram for malignant neoplasm of breast: Secondary | ICD-10-CM | POA: Diagnosis not present

## 2019-08-06 DIAGNOSIS — J301 Allergic rhinitis due to pollen: Secondary | ICD-10-CM | POA: Diagnosis not present

## 2019-08-13 DIAGNOSIS — J301 Allergic rhinitis due to pollen: Secondary | ICD-10-CM | POA: Diagnosis not present

## 2019-08-27 DIAGNOSIS — J301 Allergic rhinitis due to pollen: Secondary | ICD-10-CM | POA: Diagnosis not present

## 2019-09-03 DIAGNOSIS — J301 Allergic rhinitis due to pollen: Secondary | ICD-10-CM | POA: Diagnosis not present

## 2019-09-05 DIAGNOSIS — J301 Allergic rhinitis due to pollen: Secondary | ICD-10-CM | POA: Diagnosis not present

## 2019-09-10 DIAGNOSIS — J301 Allergic rhinitis due to pollen: Secondary | ICD-10-CM | POA: Diagnosis not present

## 2019-09-17 DIAGNOSIS — J301 Allergic rhinitis due to pollen: Secondary | ICD-10-CM | POA: Diagnosis not present

## 2019-09-24 DIAGNOSIS — J301 Allergic rhinitis due to pollen: Secondary | ICD-10-CM | POA: Diagnosis not present

## 2019-10-01 DIAGNOSIS — J301 Allergic rhinitis due to pollen: Secondary | ICD-10-CM | POA: Diagnosis not present

## 2019-10-08 DIAGNOSIS — J301 Allergic rhinitis due to pollen: Secondary | ICD-10-CM | POA: Diagnosis not present

## 2019-10-16 DIAGNOSIS — J301 Allergic rhinitis due to pollen: Secondary | ICD-10-CM | POA: Diagnosis not present

## 2019-10-29 DIAGNOSIS — J301 Allergic rhinitis due to pollen: Secondary | ICD-10-CM | POA: Diagnosis not present

## 2019-11-03 DIAGNOSIS — E039 Hypothyroidism, unspecified: Secondary | ICD-10-CM | POA: Diagnosis not present

## 2019-11-03 DIAGNOSIS — Z0000 Encounter for general adult medical examination without abnormal findings: Secondary | ICD-10-CM | POA: Diagnosis not present

## 2019-11-03 DIAGNOSIS — E538 Deficiency of other specified B group vitamins: Secondary | ICD-10-CM | POA: Diagnosis not present

## 2019-11-03 DIAGNOSIS — Z79899 Other long term (current) drug therapy: Secondary | ICD-10-CM | POA: Diagnosis not present

## 2019-11-03 DIAGNOSIS — R7302 Impaired glucose tolerance (oral): Secondary | ICD-10-CM | POA: Diagnosis not present

## 2019-11-03 DIAGNOSIS — E782 Mixed hyperlipidemia: Secondary | ICD-10-CM | POA: Diagnosis not present

## 2019-11-04 DIAGNOSIS — E538 Deficiency of other specified B group vitamins: Secondary | ICD-10-CM | POA: Diagnosis not present

## 2019-11-04 DIAGNOSIS — Z0000 Encounter for general adult medical examination without abnormal findings: Secondary | ICD-10-CM | POA: Diagnosis not present

## 2019-11-04 DIAGNOSIS — R7302 Impaired glucose tolerance (oral): Secondary | ICD-10-CM | POA: Diagnosis not present

## 2019-11-04 DIAGNOSIS — Z79899 Other long term (current) drug therapy: Secondary | ICD-10-CM | POA: Diagnosis not present

## 2019-11-04 DIAGNOSIS — E782 Mixed hyperlipidemia: Secondary | ICD-10-CM | POA: Diagnosis not present

## 2019-11-05 DIAGNOSIS — J301 Allergic rhinitis due to pollen: Secondary | ICD-10-CM | POA: Diagnosis not present

## 2019-11-13 DIAGNOSIS — J301 Allergic rhinitis due to pollen: Secondary | ICD-10-CM | POA: Diagnosis not present

## 2019-11-17 DIAGNOSIS — J309 Allergic rhinitis, unspecified: Secondary | ICD-10-CM | POA: Diagnosis not present

## 2019-11-25 DIAGNOSIS — J301 Allergic rhinitis due to pollen: Secondary | ICD-10-CM | POA: Diagnosis not present

## 2019-11-26 DIAGNOSIS — J301 Allergic rhinitis due to pollen: Secondary | ICD-10-CM | POA: Diagnosis not present

## 2019-12-10 DIAGNOSIS — J301 Allergic rhinitis due to pollen: Secondary | ICD-10-CM | POA: Diagnosis not present

## 2019-12-17 DIAGNOSIS — J301 Allergic rhinitis due to pollen: Secondary | ICD-10-CM | POA: Diagnosis not present

## 2019-12-24 DIAGNOSIS — J301 Allergic rhinitis due to pollen: Secondary | ICD-10-CM | POA: Diagnosis not present

## 2019-12-31 DIAGNOSIS — J301 Allergic rhinitis due to pollen: Secondary | ICD-10-CM | POA: Diagnosis not present

## 2020-01-07 DIAGNOSIS — J301 Allergic rhinitis due to pollen: Secondary | ICD-10-CM | POA: Diagnosis not present

## 2020-01-14 DIAGNOSIS — J301 Allergic rhinitis due to pollen: Secondary | ICD-10-CM | POA: Diagnosis not present

## 2020-01-21 DIAGNOSIS — J301 Allergic rhinitis due to pollen: Secondary | ICD-10-CM | POA: Diagnosis not present

## 2020-01-28 DIAGNOSIS — J301 Allergic rhinitis due to pollen: Secondary | ICD-10-CM | POA: Diagnosis not present

## 2020-02-05 DIAGNOSIS — J301 Allergic rhinitis due to pollen: Secondary | ICD-10-CM | POA: Diagnosis not present

## 2020-02-11 DIAGNOSIS — J301 Allergic rhinitis due to pollen: Secondary | ICD-10-CM | POA: Diagnosis not present

## 2020-02-18 DIAGNOSIS — J301 Allergic rhinitis due to pollen: Secondary | ICD-10-CM | POA: Diagnosis not present

## 2020-02-20 DIAGNOSIS — J301 Allergic rhinitis due to pollen: Secondary | ICD-10-CM | POA: Diagnosis not present

## 2020-02-24 DIAGNOSIS — M62830 Muscle spasm of back: Secondary | ICD-10-CM | POA: Diagnosis not present

## 2020-02-25 DIAGNOSIS — J301 Allergic rhinitis due to pollen: Secondary | ICD-10-CM | POA: Diagnosis not present

## 2020-03-03 DIAGNOSIS — J301 Allergic rhinitis due to pollen: Secondary | ICD-10-CM | POA: Diagnosis not present

## 2020-03-10 DIAGNOSIS — J301 Allergic rhinitis due to pollen: Secondary | ICD-10-CM | POA: Diagnosis not present

## 2020-03-16 ENCOUNTER — Encounter: Payer: Self-pay | Admitting: Obstetrics and Gynecology

## 2020-03-16 ENCOUNTER — Ambulatory Visit (INDEPENDENT_AMBULATORY_CARE_PROVIDER_SITE_OTHER): Payer: BC Managed Care – PPO | Admitting: Obstetrics and Gynecology

## 2020-03-16 ENCOUNTER — Other Ambulatory Visit: Payer: Self-pay

## 2020-03-16 VITALS — BP 124/66 | HR 74 | Ht 65.0 in | Wt 178.0 lb

## 2020-03-16 DIAGNOSIS — R102 Pelvic and perineal pain: Secondary | ICD-10-CM | POA: Diagnosis not present

## 2020-03-16 DIAGNOSIS — M5431 Sciatica, right side: Secondary | ICD-10-CM

## 2020-03-16 DIAGNOSIS — N898 Other specified noninflammatory disorders of vagina: Secondary | ICD-10-CM | POA: Diagnosis not present

## 2020-03-16 NOTE — Progress Notes (Addendum)
Obstetrics & Gynecology Office Visit   Chief Complaint:  Chief Complaint  Patient presents with  . low pelvic pain    pelvic pain radiates to groin area    History of Present Illness:  [name] is a 59 y.o. G1P1001 who LMP was No LMP recorded. Patient has had a hysterectomy., presents today for a problem visit.   Patient complains of an abnormal vaginal discharge over the past few weeks. Discharge described as: thin. Vaginal symptoms include odor.   Other associated symptoms: none.Menstrual pattern: N/A s/p prior TAH, BSO.  She denies recent antibiotic exposure, admits to changes in soaps, detergents coinciding with the onset of her symptoms.  She has not previously self treated or been under treatment by another provider for these symptoms.   She has experience pain around her sacrum and coccyx over the past month.  In addition she has lower back and pain in her right leg, that radiates down to her knee.  She initially felt that these were simply because of some gardening work she had done but they did no self resolved. She has seen a chiropractor who afforded her with some relief.  She is interested in establishing with physical therapy as we had previously seen them for some pelvic pain and felt this was helpful.     Review of Systems: Review of Systems  Constitutional: Negative.   Gastrointestinal: Negative.   Genitourinary: Negative.   Musculoskeletal: Positive for back pain. Negative for falls, joint pain and neck pain.  Skin: Negative.   Neurological: Negative for sensory change.   Past Medical History:  Past Medical History:  Diagnosis Date  . Allergy   . Anxiety   . Asthma   . B12 deficiency   . Barrett esophagus   . Chronic atrophic gastritis   . DVT, lower extremity ([place])   . Gastric polyps   . HLD (hyperlipidemia)   . Hypothyroidism   . IGT (impaired glucose tolerance)   . Neuroendocrine tumor    carcinoid tumors of the stomach, s/p extraction  surgery at [facility]  . PONV (postoperative nausea and vomiting)   . Tumor cells, malignant ([place])    Hx of carcinoid tumors of the stomach, s/p extraction surgery at [facility]  . Vitamin D deficiency     Past Surgical History:  Past Surgical History:  Procedure Laterality Date  . ABDOMINAL HYSTERECTOMY    . APPENDECTOMY    . CHOLECYSTECTOMY N/A 11/16/2016   Procedure: LAPAROSCOPIC CHOLECYSTECTOMY WITH INTRAOPERATIVE CHOLANGIOGRAM;  Surgeon: Leonie Green, MD;  Location: [facility] ORS;  Service: General;  Laterality: N/A;  . DIAGNOSTIC LAPAROSCOPY    . ESOPHAGOGASTRODUODENOSCOPY    . TONSILLECTOMY    . VEIN LIGATION AND STRIPPING      Gynecologic History: No LMP recorded. Patient has had a hysterectomy.  Obstetric History: G1P1001  Family History:  Family History  Problem Relation Age of Onset  . Clotting disorder Mother   . Hyperlipidemia Father   . Breast cancer Neg Hx     Social History:  Social History   Socioeconomic History  . Marital status: Married    Spouse name: Not on file  . Number of children: Not on file  . Years of education: Not on file  . Highest education level: Not on file  Occupational History  . Not on file  Tobacco Use  . Smoking status: Never Smoker  . Smokeless tobacco: Never Used  Substance and Sexual Activity  . Alcohol  use: No    Alcohol/week: 0.0 standard drinks  . Drug use: No  . Sexual activity: Yes    Birth control/protection: None, Surgical    Comment: Hysterectomy  Other Topics Concern  . Not on file  Social History Narrative  . Not on file   Social Determinants of Health   Financial Resource Strain:   . Difficulty of Paying Living Expenses:   Food Insecurity:   . Worried About Charity fundraiser in the Last Year:   . Arboriculturist in the Last Year:   Transportation Needs:   . Film/video editor (Medical):   Marland Kitchen Lack of Transportation (Non-Medical):   Physical Activity:   . Days of Exercise per Week:   . Minutes of  Exercise per Session:   Stress:   . Feeling of Stress :   Social Connections:   . Frequency of Communication with Friends and Family:   . Frequency of Social Gatherings with Friends and Family:   . Attends Religious Services:   . Active Member of Clubs or Organizations:   . Attends Archivist Meetings:   Marland Kitchen Marital Status:   Intimate Partner Violence:   . Fear of Current or Ex-Partner:   . Emotionally Abused:   Marland Kitchen Physically Abused:   . Sexually Abused:     Allergies:  Allergies  Allergen Reactions  . Latex Swelling    SWELLING AROUND FACE SWELLING AROUND FACE  . Neomycin Other (See Comments) and Rash    Seen from allergy skin test Seen from allergy skin test  . Neosporin  [Neomycin-Bacitracin Zn-Polymyx] Other (See Comments)    Seen from allergy skin test  . Neomycin-Polymyxin-Gramicidin     Other reaction(s): Unknown Seen from allergy skin test  . Other Other (See Comments)    THIMERSOL. THIMERSOL- seen from allergy skin test Other reaction(s): Unknown THIMERSOL. THIMERSOL- seen from allergy skin test    Medications: Prior to Admission medications   Medication Sig Start Date End Date Taking? Authorizing Provider  busPIRone (BUSPAR) 10 MG tablet Take 10 mg by mouth 2 (two) times daily. 08/21/18  Yes [provider]  cetirizine (ZYRTEC) 10 MG tablet Take 10 mg by mouth daily.   Yes [provider]  clobetasol ointment (TEMOVATE) 0.17 % Apply 1 application topically as needed.   Yes [provider]  Cyanocobalamin (RA VITAMIN B-12 TR) 1000 MCG TBCR Take by mouth.   Yes [provider]  dicyclomine (BENTYL) 10 MG capsule Take 1 capsule (10 mg total) by mouth 4 (four) times daily -  before meals and at bedtime. 07/21/19  Yes Wohl, Darren, MD  EPINEPHrine 0.3 mg/0.3 mL IJ SOAJ injection Inject into the muscle. 03/27/17  Yes [provider]  esomeprazole (NEXIUM) 20 MG capsule Take by mouth.   Yes [provider]    levothyroxine (SYNTHROID, LEVOTHROID) 75 MCG tablet Take 1 tablet (75 mcg total) by mouth daily. 03/15/16  Yes Lada, Satira Anis, MD  mometasone (ELOCON) 0.1 % ointment Apply 1 application topically as needed.   Yes [provider]  Multiple Vitamin (MULTI-VITAMINS) TABS Take 1 tablet by mouth daily.  10/07/04  Yes [provider]  pimecrolimus (ELIDEL) 1 % cream Apply topically. 04/12/18  Yes [provider]  Tuberculin-Allergy Syringes (ALLERGIST TRAY 1CC 27GX3/8") 27G X 3/8" 1 ML KIT Allergist Tray Intradermal Bevel 1 mL 27 gauge x 3/8" syringe   Yes [provider]  ALPRAZolam (XANAX) 0.5 MG tablet alprazolam 0.5 mg  tablet    [provider]  Beclomethasone Dipropionate 40 MCG/ACT AERS 2 sprays.    [provider]  celecoxib (CELEBREX) 200 MG capsule Take 1 capsule (200 mg total) 2 (two) times daily by mouth. Take with food Patient not taking: Reported on 08/29/2018 10/13/17   Crecencio Mc P, PA-C  DEXILANT 30 MG capsule TAKE 1 CAPSULE (30 MG TOTAL) BY MOUTH ONCE DAILY TAKE 30 MINUTES PRIOR TO BREAKFAST 07/03/18   [provider]  diclofenac (VOLTAREN) 75 MG EC tablet diclofenac sodium 75 mg tablet,delayed release    [provider]  fexofenadine (ALLEGRA) 180 MG tablet Take 180 mg by mouth daily. 08/02/18   [provider]  HYDROcodone-acetaminophen (NORCO/VICODIN) 5-325 MG tablet hydrocodone 5 mg-acetaminophen 325 mg tablet    [provider]  hydrocortisone (PROCTOZONE-HC) 2.5 % rectal cream Proctozone-HC 2.5 % topical cream perineal applicator    [provider]  meclizine (ANTIVERT) 25 MG tablet meclizine 25 mg tablet  TK 1 T PO Q 4 - 6 H    [provider]  pantoprazole (PROTONIX) 40 MG tablet pantoprazole 40 mg tablet,delayed release    [provider]  PARoxetine (PAXIL) 10 MG tablet paroxetine 10 mg tablet    [provider]    Physical Exam Vitals:  Vitals:    03/16/20 0920  BP: 124/66  Pulse: 74   No LMP recorded. Patient has had a hysterectomy.  General: NAD, well nourished, appears stated age 70: normocephalic, anicteric Pulmonary: No increased work of breathing Genitourinary:  External: Normal external female genitalia.  Normal urethral meatus, normal  Bartholin's and Skene's glands.    Vagina: Normal vaginal mucosa, no evidence of prolapse.    Cervix: surgically absent  Uterus: surgically absent  Rectal: deferred Extremities: no edema, erythema, or tenderness Neurologic: Grossly intact Psychiatric: mood appropriate, affect full  Female chaperone present for pelvic  portions of the physical exam  Assessment: 59 y.o. G1P1001 vaginal odor, pelvic pain  Plan: Problem List Items Addressed This Visit    None    Visit Diagnoses    Vaginal odor    -  Primary   Relevant Orders   NuSwab BV and Candida, NAA   Sciatica of right side       Pelvic pain in female         1) Vaginal odor - Risk factors for bacterial vaginosis and candida infections discussed.  We discussed normal vaginal flora/microbiome.  Any factors that may alter the microbiome increase the risk of these opportunistic infections.  These include changes in pH, antibiotic exposures, diabetes, wet bathing suits etc.  We discussed that treatment is aimed at eradicating abnormal bacterial overgrowth and or yeast.  There may be some role for vaginal probiotics in restoring normal vaginal flora.   - NUswab collected today  2) Pelvic pain - wants re-refrall to pelvic PT.  Was seen in the past and felt helpful   Malachy Mood, MD, [place], [place]

## 2020-03-17 DIAGNOSIS — J301 Allergic rhinitis due to pollen: Secondary | ICD-10-CM | POA: Diagnosis not present

## 2020-03-18 LAB — NUSWAB BV AND CANDIDA, NAA
Candida albicans, NAA: NEGATIVE
Candida glabrata, NAA: NEGATIVE

## 2020-03-24 DIAGNOSIS — J301 Allergic rhinitis due to pollen: Secondary | ICD-10-CM | POA: Diagnosis not present

## 2020-03-31 DIAGNOSIS — J301 Allergic rhinitis due to pollen: Secondary | ICD-10-CM | POA: Diagnosis not present

## 2020-04-07 DIAGNOSIS — J301 Allergic rhinitis due to pollen: Secondary | ICD-10-CM | POA: Diagnosis not present

## 2020-04-14 DIAGNOSIS — J301 Allergic rhinitis due to pollen: Secondary | ICD-10-CM | POA: Diagnosis not present

## 2020-04-27 ENCOUNTER — Ambulatory Visit: Payer: BC Managed Care – PPO | Admitting: Gastroenterology

## 2020-04-28 DIAGNOSIS — J301 Allergic rhinitis due to pollen: Secondary | ICD-10-CM | POA: Diagnosis not present

## 2020-05-08 DIAGNOSIS — J301 Allergic rhinitis due to pollen: Secondary | ICD-10-CM | POA: Diagnosis not present

## 2020-05-12 DIAGNOSIS — J301 Allergic rhinitis due to pollen: Secondary | ICD-10-CM | POA: Diagnosis not present

## 2020-05-17 ENCOUNTER — Telehealth: Payer: Self-pay | Admitting: Gastroenterology

## 2020-05-17 NOTE — Telephone Encounter (Signed)
CORRECTION(On the earlier message left on  05-17-20 @ 1:24 PM stating patient wanted to have a colonoscopy instead wanted an Endoscopy .

## 2020-05-17 NOTE — Telephone Encounter (Signed)
Patient called in & l/m on v/m stating she wanted to schedule a colonoscopy with DR Allen Norris. She was last seen by DR Allen Norris on 07-21-19. Does she need a referral for a colonoscopy or just schedule an appointment with Sharyn Lull?

## 2020-05-19 DIAGNOSIS — J301 Allergic rhinitis due to pollen: Secondary | ICD-10-CM | POA: Diagnosis not present

## 2020-05-19 NOTE — Telephone Encounter (Signed)
Patient last colonoscopy was with [facility] on 12/07/17. Will wait till Ginger and Dr. Allen Norris come back to make decision. Informed patient.

## 2020-05-24 ENCOUNTER — Ambulatory Visit: Payer: BC Managed Care – PPO | Admitting: Physical Therapy

## 2020-05-26 DIAGNOSIS — J301 Allergic rhinitis due to pollen: Secondary | ICD-10-CM | POA: Diagnosis not present

## 2020-06-01 ENCOUNTER — Ambulatory Visit: Payer: BC Managed Care – PPO | Attending: Obstetrics and Gynecology | Admitting: Physical Therapy

## 2020-06-03 NOTE — Telephone Encounter (Signed)
Pt was wanting you to review her information and let her know if she still needs an EUS with Dr. Francella Solian or if she can just do a regular EGD. See message below and chart info and advise.

## 2020-06-03 NOTE — Telephone Encounter (Signed)
Let the patient know that if Dr. Francella Solian had recommended the EUS then I would suggest she proceed with his recommendations.

## 2020-06-04 NOTE — Telephone Encounter (Signed)
Left vm informing pt of Dr. Dorothey Baseman recommendation.

## 2020-06-07 ENCOUNTER — Ambulatory Visit: Payer: BC Managed Care – PPO | Admitting: Physical Therapy

## 2020-06-16 DIAGNOSIS — J301 Allergic rhinitis due to pollen: Secondary | ICD-10-CM | POA: Diagnosis not present

## 2020-06-21 ENCOUNTER — Ambulatory Visit: Payer: BC Managed Care – PPO | Admitting: Physical Therapy

## 2020-06-23 DIAGNOSIS — J301 Allergic rhinitis due to pollen: Secondary | ICD-10-CM | POA: Diagnosis not present

## 2020-06-28 ENCOUNTER — Ambulatory Visit: Payer: BC Managed Care – PPO | Admitting: Physical Therapy

## 2020-06-30 DIAGNOSIS — J301 Allergic rhinitis due to pollen: Secondary | ICD-10-CM | POA: Diagnosis not present

## 2020-07-01 DIAGNOSIS — E559 Vitamin D deficiency, unspecified: Secondary | ICD-10-CM | POA: Diagnosis not present

## 2020-07-01 DIAGNOSIS — E538 Deficiency of other specified B group vitamins: Secondary | ICD-10-CM | POA: Diagnosis not present

## 2020-07-01 DIAGNOSIS — Z79899 Other long term (current) drug therapy: Secondary | ICD-10-CM | POA: Diagnosis not present

## 2020-07-01 DIAGNOSIS — D3A8 Other benign neuroendocrine tumors: Secondary | ICD-10-CM | POA: Diagnosis not present

## 2020-07-01 DIAGNOSIS — J45909 Unspecified asthma, uncomplicated: Secondary | ICD-10-CM | POA: Diagnosis not present

## 2020-07-01 DIAGNOSIS — L2084 Intrinsic (allergic) eczema: Secondary | ICD-10-CM | POA: Diagnosis not present

## 2020-07-01 DIAGNOSIS — K3189 Other diseases of stomach and duodenum: Secondary | ICD-10-CM | POA: Diagnosis not present

## 2020-07-01 DIAGNOSIS — K2100 Gastro-esophageal reflux disease with esophagitis, without bleeding: Secondary | ICD-10-CM | POA: Diagnosis not present

## 2020-07-01 DIAGNOSIS — K2940 Chronic atrophic gastritis without bleeding: Secondary | ICD-10-CM | POA: Diagnosis not present

## 2020-07-01 DIAGNOSIS — E785 Hyperlipidemia, unspecified: Secondary | ICD-10-CM | POA: Diagnosis not present

## 2020-07-01 DIAGNOSIS — Z86718 Personal history of other venous thrombosis and embolism: Secondary | ICD-10-CM | POA: Diagnosis not present

## 2020-07-01 DIAGNOSIS — K2270 Barrett's esophagus without dysplasia: Secondary | ICD-10-CM | POA: Diagnosis not present

## 2020-07-01 DIAGNOSIS — K2950 Unspecified chronic gastritis without bleeding: Secondary | ICD-10-CM | POA: Diagnosis not present

## 2020-07-01 DIAGNOSIS — Z7951 Long term (current) use of inhaled steroids: Secondary | ICD-10-CM | POA: Diagnosis not present

## 2020-07-01 DIAGNOSIS — E039 Hypothyroidism, unspecified: Secondary | ICD-10-CM | POA: Diagnosis not present

## 2020-07-01 DIAGNOSIS — Z8719 Personal history of other diseases of the digestive system: Secondary | ICD-10-CM | POA: Diagnosis not present

## 2020-07-01 DIAGNOSIS — K208 Other esophagitis without bleeding: Secondary | ICD-10-CM | POA: Diagnosis not present

## 2020-07-05 ENCOUNTER — Ambulatory Visit: Payer: BC Managed Care – PPO | Admitting: Physical Therapy

## 2020-07-05 DIAGNOSIS — D1801 Hemangioma of skin and subcutaneous tissue: Secondary | ICD-10-CM | POA: Diagnosis not present

## 2020-07-05 DIAGNOSIS — L738 Other specified follicular disorders: Secondary | ICD-10-CM | POA: Diagnosis not present

## 2020-07-05 DIAGNOSIS — L720 Epidermal cyst: Secondary | ICD-10-CM | POA: Diagnosis not present

## 2020-07-05 DIAGNOSIS — H0260 Xanthelasma of unspecified eye, unspecified eyelid: Secondary | ICD-10-CM | POA: Diagnosis not present

## 2020-07-07 DIAGNOSIS — J301 Allergic rhinitis due to pollen: Secondary | ICD-10-CM | POA: Diagnosis not present

## 2020-07-12 ENCOUNTER — Ambulatory Visit: Payer: BC Managed Care – PPO | Admitting: Physical Therapy

## 2020-07-12 ENCOUNTER — Ambulatory Visit: Payer: BC Managed Care – PPO | Attending: Obstetrics and Gynecology | Admitting: Physical Therapy

## 2020-07-12 ENCOUNTER — Other Ambulatory Visit: Payer: Self-pay

## 2020-07-12 DIAGNOSIS — M533 Sacrococcygeal disorders, not elsewhere classified: Secondary | ICD-10-CM

## 2020-07-12 DIAGNOSIS — R278 Other lack of coordination: Secondary | ICD-10-CM

## 2020-07-12 DIAGNOSIS — M62838 Other muscle spasm: Secondary | ICD-10-CM | POA: Diagnosis not present

## 2020-07-13 NOTE — Therapy (Addendum)
[place] MAIN [facility] SERVICES [address] [place], [place], [address] Phone: [phone]   Fax:  [phone]  Physical Therapy Evaluation  Patient Details  Name: Dana Hensley MRN: [id number] Date of Birth: 09/18/1961 Referring Provider (PT): Georgianne Fick MD    Encounter Date: 07/12/2020   PT End of Session - 07/13/20 1547    Visit Number 1    Number of Visits 10    Date for PT Re-Evaluation 09/21/20    Activity Tolerance Patient tolerated treatment well    Behavior During Therapy [facility] for tasks assessed/performed           Past Medical History:  Diagnosis Date  . Allergy   . Anxiety   . Asthma   . B12 deficiency   . Barrett esophagus   . Chronic atrophic gastritis   . DVT, lower extremity ([place])   . Gastric polyps   . HLD (hyperlipidemia)   . Hypothyroidism   . IGT (impaired glucose tolerance)   . Neuroendocrine tumor    carcinoid tumors of the stomach, s/p extraction surgery at [facility]  . PONV (postoperative nausea and vomiting)   . Tumor cells, malignant ([place])    Hx of carcinoid tumors of the stomach, s/p extraction surgery at [facility]  . Vitamin D deficiency     Past Surgical History:  Procedure Laterality Date  . ABDOMINAL HYSTERECTOMY    . APPENDECTOMY    . CHOLECYSTECTOMY N/A 11/16/2016   Procedure: LAPAROSCOPIC CHOLECYSTECTOMY WITH INTRAOPERATIVE CHOLANGIOGRAM;  Surgeon: Leonie Green, MD;  Location: [facility] ORS;  Service: General;  Laterality: N/A;  . DIAGNOSTIC LAPAROSCOPY    . ESOPHAGOGASTRODUODENOSCOPY    . TONSILLECTOMY    . VEIN LIGATION AND STRIPPING      There were no vitals filed for this visit.    Subjective Assessment - 07/12/20 1310    Subjective 1) SIJ pain:  Pt reported beginning of May, pt was gardening a lot. Pt was on her knees and digging and afterwards noticed back pain. The next few days the pain went down around her hips and buttocks and innter thighs. Pain at that time 6/10 and currently 4/10. No  pain with sitting, only discomfort. Pain occurs with bending forward. Sitting for 1-2 hours, pt notices numbness at the sitting bones but not in the pelvic area, turning L/ R.   Pt tried chiropractic Tx which did not help.  Denied numbness in the pelvic area, uncontrollable loss of urine.    2) midback pain started last year. it occurs with turning, driving. sitting. 7-8/10.  Pain eases when husband massage it.    Pertinent History Gyn Hx: abdominal hysterectomy.  Additional surgery: appendix and gall bladder removal.    Patient Stated Goals less pain,              [facility] PT Assessment - 07/13/20 1550      Assessment   Medical Diagnosis Low back pain    Referring Provider (PT) Georgianne Fick MD       Precautions   Precautions None      Restrictions   Weight Bearing Restrictions No      Balance Screen   Has the patient fallen in the past 6 months No      Prior Function   Level of Independence Independent      AROM   Overall AROM Comments sideflexion B painful ~30%, R rotation ~45% pain, L 45 %       Strength   Overall  Strength Comments hip flex, knee flex/ ext 4+/5 B, hip abd R 4-/5         Palpation   Spinal mobility R shoulder slightly higher, hypmobile T3-6 segments     SI assessment  L PSIS more posterior, R ASIS higher , R SIJ more hypomobile                        Objective measurements completed on examination: See above findings.       [place] Adult PT Treatment/Exercise - 07/13/20 1549      Exercises   Exercises --   cued for exercise technique      Manual Therapy   Manual therapy comments Rotational mob at T3-10, medial scap mm / interspinal mm R                        PT Long Term Goals - 07/12/20 1425      PT LONG TERM GOAL #1   Title Pt will increase her FOTO score from 52 pts to > 62 pts in order to improve lumbar function    Time 8    Period Weeks    Status New    Target Date 09/06/20      PT LONG TERM GOAL #2   Title Pt  will demo no hypomobility at T spine T3-10 and equal pelvic alignment across 2 weeks in order to return to bend and sit    Time 4    Period Weeks    Status New    Target Date 08/09/20      PT LONG TERM GOAL #3   Title Pt will show IND with proper body mechanics with gardening, household chores, and activities in order to return ADLs and minimize injuries    Time 8    Period Weeks    Status New    Target Date 09/06/20                  Plan - 07/13/20 1551    Clinical Impression Statement  Pt is a 59 yo  who presents with SIJ which started 5 months ago after gardening and midback pain which started 1 year ago.  Denied numbness in the pelvic area, uncontrollable loss of urine.  Pain occurs with bending forward. Sitting for 1-2 hours, pt notices numbness at the sitting bones but not in the pelvic area, turning L/ R.    Pt's musculoskeletal assessment revealed pelvic girdle malalignment, thoracic hypomobility, and limited spinal mobility, pain with B sideflexion.  abdominopelvic/spinal dysfunctions.   Pt was provided education on etiology of Sx with anatomy, physiology explanation with images along with the benefits of customized pelvic PT Tx based on pt's medical conditions and musculoskeletal deficits.  Explained the physiology of deep core mm coordination and roles of pelvic floor function in urination, defecation, sexual function, and postural control with deep core mm system.   Following Tx today which pt tolerated without complaints, pt demo'd equal alignment of pelvic girdle and increased spinal mobility. Plan to apply deep core coordination/ strengthening at next session.  Continue to monitor musculoskeletal improvements and refer back to provider for medical workup if musculoskeletal changes do not improve pain.       Examination-Activity Limitations Sit;Bend    Rehab Potential Good    PT Frequency 1x / week    PT Duration Other (comment)   10   PT Treatment/Interventions  Therapeutic exercise;Therapeutic activities;Functional  mobility training;Patient/family education;Balance training;Neuromuscular re-education;Manual techniques;Scar mobilization;Passive range of motion;Moist Heat    Consulted and Agree with Plan of Care Patient           Patient will benefit from skilled therapeutic intervention in order to improve the following deficits and impairments:  Increased fascial restricitons, Decreased range of motion, Decreased endurance, Decreased safety awareness, Increased muscle spasms, Decreased activity tolerance, Pain, Hypomobility, Impaired flexibility, Decreased scar mobility, Postural dysfunction, Decreased mobility, Decreased coordination  Visit Diagnosis: Other lack of coordination  Other muscle spasm  Sacrococcygeal disorders, not elsewhere classified     Problem List Patient Active Problem List   Diagnosis Date Noted  . Hyperlipidemia, unspecified 08/29/2018  . IGT (impaired glucose tolerance) 08/29/2018  . Intrinsic atopic dermatitis 08/29/2018  . History of adenomatous polyp of colon 04/01/2018  . Elevated liver function tests 01/18/2018  . Weight loss 01/15/2017  . Pain in limb 12/18/2016  . Calculus of gallbladder without cholecystitis without obstruction 10/23/2016  . Chronic venous insufficiency 10/16/2016  . Varicose veins of both lower extremities with pain 10/16/2016  . Lymphedema 10/16/2016  . Epigastric pain 10/03/2016  . Barrett's esophagus determined by endoscopy 01/25/2016  . Vitamin B12 deficiency 10/18/2015  . Chronic neck and back pain 08/19/2015  . Body odor 07/09/2015  . GAD (generalized anxiety disorder) 07/09/2015  . Allergic rhinitis with postnasal drip 07/09/2015  . Dyslipidemia 05/05/2015  . Acquired hypothyroidism 05/05/2015  . Carcinoid tumor 05/05/2015  . History of malignant carcinoid tumor of stomach 03/26/2012    Jerl Mina ,PT, DPT, E-RYT  07/13/2020, 3:51 PM  [place] MAIN [facility] SERVICES [address] [place], [place], [address] Phone: [phone]   Fax:  [phone]  Name: Dana Hensley MRN: [id number] Date of Birth: 1961-07-15

## 2020-07-16 DIAGNOSIS — Z20822 Contact with and (suspected) exposure to covid-19: Secondary | ICD-10-CM | POA: Diagnosis not present

## 2020-07-16 DIAGNOSIS — J029 Acute pharyngitis, unspecified: Secondary | ICD-10-CM | POA: Diagnosis not present

## 2020-07-18 ENCOUNTER — Ambulatory Visit
Admission: RE | Admit: 2020-07-18 | Discharge: 2020-07-18 | Disposition: A | Discharge status: Home / Self Care | Payer: BC Managed Care – PPO | Source: Ambulatory Visit | Attending: Family Medicine | Admitting: Family Medicine

## 2020-07-18 ENCOUNTER — Other Ambulatory Visit: Payer: Self-pay

## 2020-07-18 VITALS — BP 117/84 | HR 74 | Temp 99.4°F | Resp 20

## 2020-07-18 DIAGNOSIS — J029 Acute pharyngitis, unspecified: Secondary | ICD-10-CM

## 2020-07-18 DIAGNOSIS — J069 Acute upper respiratory infection, unspecified: Secondary | ICD-10-CM

## 2020-07-18 MED ORDER — BENZONATATE 100 MG PO CAPS: 100.0000 mg | ORAL_CAPSULE | Freq: Three times a day (TID) | ORAL | 0 refills | Status: DC

## 2020-07-18 MED ORDER — PREDNISONE 10 MG (21) PO TBPK: ORAL_TABLET | Freq: Every day | ORAL | 0 refills | Status: AC

## 2020-07-18 MED ORDER — AMOXICILLIN 875 MG PO TABS: 875.0000 mg | ORAL_TABLET | Freq: Two times a day (BID) | ORAL | 0 refills | Status: AC

## 2020-07-18 NOTE — Discharge Instructions (Signed)
I have sent in amoxicillin for you to take twice daily for 10 days  I have sent in a prednisone taper for you to take for 6 days. 6 tablets on day one, 5 tablets on day two, 4 tablets on day three, 3 tablets on day four, 2 tablets on day five, and 1 tablet on day six.  I have sent in [place] for cough  Follow up with this office or with primary care as needed  Follow up with the ER for trouble swallowing, trouble breathing, other concerning symptoms

## 2020-07-18 NOTE — ED Triage Notes (Signed)
Was seen in [place] urgent care on the 20th for same and had negative covid test , pt reports symptoms as worsening and has ear pain

## 2020-07-18 NOTE — ED Provider Notes (Signed)
[place]   [id number] 07/18/20 Arrival Time: 1202  PO:EUMP THROAT  SUBJECTIVE: History from: patient.  Dana Hensley is a 59 y.o. female who presents with abrupt onset of sore throat, ear pain for the last 8 days. Denies sick exposure to Covid, strep, flu or mono, or precipitating event. Was seen in UC at [facility] and is no better. Negative Covid test on 07/16/20. Has tried OTC medications without relief. Has negative  history of Covid. Has not completed Covid vaccines. Symptoms are made worse with swallowing, but tolerating liquids and own secretions without difficulty. Denies previous symptoms in the past.     Denies fever, chills, fatigue, sinus pain, rhinorrhea, nasal congestion, cough, SOB, wheezing, chest pain, nausea, rash, changes in bowel or bladder habits.     ROS: As per HPI.  All other pertinent ROS negative.     Past Medical History:  Diagnosis Date  . Allergy   . Anxiety   . Asthma   . B12 deficiency   . Barrett esophagus   . Chronic atrophic gastritis   . DVT, lower extremity ([place])   . Gastric polyps   . HLD (hyperlipidemia)   . Hypothyroidism   . IGT (impaired glucose tolerance)   . Neuroendocrine tumor    carcinoid tumors of the stomach, s/p extraction surgery at [facility]  . PONV (postoperative nausea and vomiting)   . Tumor cells, malignant ([place])    Hx of carcinoid tumors of the stomach, s/p extraction surgery at [facility]  . Vitamin D deficiency    Past Surgical History:  Procedure Laterality Date  . ABDOMINAL HYSTERECTOMY    . APPENDECTOMY    . CHOLECYSTECTOMY N/A 11/16/2016   Procedure: LAPAROSCOPIC CHOLECYSTECTOMY WITH INTRAOPERATIVE CHOLANGIOGRAM;  Surgeon: Leonie Green, MD;  Location: [facility] ORS;  Service: General;  Laterality: N/A;  . DIAGNOSTIC LAPAROSCOPY    . ESOPHAGOGASTRODUODENOSCOPY    . TONSILLECTOMY    . VEIN LIGATION AND STRIPPING     Allergies  Allergen Reactions  . Latex Swelling    SWELLING AROUND FACE SWELLING  AROUND FACE  . Neomycin Other (See Comments) and Rash    Seen from allergy skin test Seen from allergy skin test  . Neosporin  [Neomycin-Bacitracin Zn-Polymyx] Other (See Comments)    Seen from allergy skin test  . Neomycin-Polymyxin-Gramicidin     Other reaction(s): Unknown Seen from allergy skin test  . Other Other (See Comments)    THIMERSOL. THIMERSOL- seen from allergy skin test Other reaction(s): Unknown THIMERSOL. THIMERSOL- seen from allergy skin test   No current facility-administered medications on file prior to encounter.   Current Outpatient Medications on File Prior to Encounter  Medication Sig Dispense Refill  . ALPRAZolam (XANAX) 0.5 MG tablet alprazolam 0.5 mg tablet    . Beclomethasone Dipropionate 40 MCG/ACT AERS 2 sprays.    . busPIRone (BUSPAR) 10 MG tablet Take 10 mg by mouth 2 (two) times daily.  0  . celecoxib (CELEBREX) 200 MG capsule Take 1 capsule (200 mg total) 2 (two) times daily by mouth. Take with food (Patient not taking: Reported on 08/29/2018) 60 capsule 0  . cetirizine (ZYRTEC) 10 MG tablet Take 10 mg by mouth daily.    . clobetasol ointment (TEMOVATE) 5.36 % Apply 1 application topically as needed.    . Cyanocobalamin (RA VITAMIN B-12 TR) 1000 MCG TBCR Take by mouth.    . DEXILANT 30 MG capsule TAKE 1 CAPSULE (30 MG TOTAL) BY MOUTH ONCE DAILY TAKE 30 MINUTES PRIOR  TO BREAKFAST  11  . diclofenac (VOLTAREN) 75 MG EC tablet diclofenac sodium 75 mg tablet,delayed release    . dicyclomine (BENTYL) 10 MG capsule Take 1 capsule (10 mg total) by mouth 4 (four) times daily -  before meals and at bedtime. 90 capsule 3  . EPINEPHrine 0.3 mg/0.3 mL IJ SOAJ injection Inject into the muscle.    . esomeprazole (NEXIUM) 20 MG capsule Take by mouth.    . fexofenadine (ALLEGRA) 180 MG tablet Take 180 mg by mouth daily.  9  . HYDROcodone-acetaminophen (NORCO/VICODIN) 5-325 MG tablet hydrocodone 5 mg-acetaminophen 325 mg tablet    . hydrocortisone (PROCTOZONE-HC) 2.5 %  rectal cream Proctozone-HC 2.5 % topical cream perineal applicator    . levothyroxine (SYNTHROID, LEVOTHROID) 75 MCG tablet Take 1 tablet (75 mcg total) by mouth daily. 90 tablet 1  . meclizine (ANTIVERT) 25 MG tablet meclizine 25 mg tablet  TK 1 T PO Q 4 - 6 H    . mometasone (ELOCON) 0.1 % ointment Apply 1 application topically as needed.    . Multiple Vitamin (MULTI-VITAMINS) TABS Take 1 tablet by mouth daily.     . pantoprazole (PROTONIX) 40 MG tablet pantoprazole 40 mg tablet,delayed release    . PARoxetine (PAXIL) 10 MG tablet paroxetine 10 mg tablet    . pimecrolimus (ELIDEL) 1 % cream Apply topically.    . Tuberculin-Allergy Syringes (ALLERGIST TRAY 1CC 27GX3/8") 27G X 3/8" 1 ML KIT Allergist Tray Intradermal Bevel 1 mL 27 gauge x 3/8" syringe     Social History   Socioeconomic History  . Marital status: Married    Spouse name: Not on file  . Number of children: Not on file  . Years of education: Not on file  . Highest education level: Not on file  Occupational History  . Not on file  Tobacco Use  . Smoking status: Never Smoker  . Smokeless tobacco: Never Used  Vaping Use  . Vaping Use: Never used  Substance and Sexual Activity  . Alcohol use: No    Alcohol/week: 0.0 standard drinks  . Drug use: No  . Sexual activity: Yes    Birth control/protection: None, Surgical    Comment: Hysterectomy  Other Topics Concern  . Not on file  Social History Narrative  . Not on file   Social Determinants of Health   Financial Resource Strain:   . Difficulty of Paying Living Expenses: Not on file  Food Insecurity:   . Worried About Charity fundraiser in the Last Year: Not on file  . Ran Out of Food in the Last Year: Not on file  Transportation Needs:   . Lack of Transportation (Medical): Not on file  . Lack of Transportation (Non-Medical): Not on file  Physical Activity:   . Days of Exercise per Week: Not on file  . Minutes of Exercise per Session: Not on file  Stress:   .  Feeling of Stress : Not on file  Social Connections:   . Frequency of Communication with Friends and Family: Not on file  . Frequency of Social Gatherings with Friends and Family: Not on file  . Attends Religious Services: Not on file  . Active Member of Clubs or Organizations: Not on file  . Attends Archivist Meetings: Not on file  . Marital Status: Not on file  Intimate Partner Violence:   . Fear of Current or Ex-Partner: Not on file  . Emotionally Abused: Not on file  . Physically Abused:  Not on file  . Sexually Abused: Not on file   Family History  Problem Relation Age of Onset  . Clotting disorder Mother   . Hyperlipidemia Father   . Breast cancer Neg Hx     OBJECTIVE:  Vitals:   07/18/20 1237  BP: 117/84  Pulse: 74  Resp: 20  Temp: 99.4 F (37.4 C)  SpO2: 97%     General appearance: alert; appears fatigued, but nontoxic, speaking in full sentences and managing own secretions HEENT: NCAT; Ears: EACs clear, TMs pearly gray with visible cone of light, without erythema; Eyes: PERRL, EOMI grossly; Nose: no obvious rhinorrhea; Throat: oropharynx clear, tonsils 1+ and mildly erythematous without white tonsillar exudates, uvula midline Neck: supple without LAD Lungs: CTA bilaterally without adventitious breath sounds; cough absent Heart: regular rate and rhythm.  Radial pulses 2+ symmetrical bilaterally Skin: warm and dry Psychological: alert and cooperative; normal mood and affect  LABS: No results found for this or any previous visit (from the past 24 hour(s)).   ASSESSMENT & PLAN:  1. Upper respiratory tract infection, unspecified type   2. Sore throat     Meds ordered this encounter  Medications  . benzonatate (TESSALON) 100 MG capsule    Sig: Take 1 capsule (100 mg total) by mouth every 8 (eight) hours.    Dispense:  21 capsule    Refill:  0    Order Specific Question:   Supervising Provider    Answer:   Chase Picket [id number]  . predniSONE  (STERAPRED UNI-PAK 21 TAB) 10 MG (21) TBPK tablet    Sig: Take by mouth daily for 6 days. Take 6 tablets on day 1, 5 tablets on day 2, 4 tablets on day 3, 3 tablets on day 4, 2 tablets on day 5, 1 tablet on day 6    Dispense:  21 tablet    Refill:  0    Order Specific Question:   Supervising Provider    Answer:   Chase Picket [id number]  . amoxicillin (AMOXIL) 875 MG tablet    Sig: Take 1 tablet (875 mg total) by mouth 2 (two) times daily for 10 days.    Dispense:  20 tablet    Refill:  0    Order Specific Question:   Supervising Provider    Answer:   Chase Picket [[id number]]   URI  Prescribed amoxicillin 875 BID x 10 days Prescribed tessalon perles Prescribed prednisone taper Get plenty of rest and push fluids Take OTC Zyrtec and use chloraseptic spray as needed for throat pain. Drink warm or cool liquids, use throat lozenges, or popsicles to help alleviate symptoms Take OTC ibuprofen or tylenol as needed for pain Follow up with PCP if symptoms persists Return or go to ER if patient has any new or worsening symptoms such as fever, chills, nausea, vomiting, worsening sore throat, cough, abdominal pain, chest pain, changes in bowel or bladder habits  Reviewed expectations re: course of current medical issues. Questions answered. Outlined signs and symptoms indicating need for more acute intervention. Patient verbalized understanding. After Visit Summary given.          Faustino Congress, NP 07/21/20 0840

## 2020-07-19 ENCOUNTER — Ambulatory Visit: Payer: BC Managed Care – PPO | Admitting: Physical Therapy

## 2020-07-21 DIAGNOSIS — J301 Allergic rhinitis due to pollen: Secondary | ICD-10-CM | POA: Diagnosis not present

## 2020-07-26 ENCOUNTER — Other Ambulatory Visit: Payer: Self-pay

## 2020-07-26 ENCOUNTER — Ambulatory Visit: Payer: BC Managed Care – PPO | Admitting: Physical Therapy

## 2020-07-26 DIAGNOSIS — M62838 Other muscle spasm: Secondary | ICD-10-CM

## 2020-07-26 DIAGNOSIS — R278 Other lack of coordination: Secondary | ICD-10-CM | POA: Diagnosis not present

## 2020-07-26 DIAGNOSIS — M533 Sacrococcygeal disorders, not elsewhere classified: Secondary | ICD-10-CM

## 2020-07-26 NOTE — Therapy (Signed)
[place] MAIN [facility] SERVICES [address] [place], [place], [address] Phone: [phone]   Fax:  [phone]  Physical Therapy Treatment  Patient Details  Name: Dana Hensley MRN: [id number] Date of Birth: Mar 27, 1961 Referring Provider (PT): Georgianne Fick MD    Encounter Date: 07/26/2020   PT End of Session - 07/26/20 1402    Visit Number 2    Number of Visits 10    Date for PT Re-Evaluation 09/21/20    PT Start Time 1300    PT Stop Time 1402    PT Time Calculation (min) 62 min    Activity Tolerance Patient tolerated treatment well    Behavior During Therapy [facility] for tasks assessed/performed           Past Medical History:  Diagnosis Date   Allergy    Anxiety    Asthma    B12 deficiency    Barrett esophagus    Chronic atrophic gastritis    DVT, lower extremity ([place])    Gastric polyps    HLD (hyperlipidemia)    Hypothyroidism    IGT (impaired glucose tolerance)    Neuroendocrine tumor    carcinoid tumors of the stomach, s/p extraction surgery at [facility]   PONV (postoperative nausea and vomiting)    Tumor cells, malignant ([place])    Hx of carcinoid tumors of the stomach, s/p extraction surgery at [facility]   Vitamin D deficiency     Past Surgical History:  Procedure Laterality Date   ABDOMINAL HYSTERECTOMY     APPENDECTOMY     CHOLECYSTECTOMY N/A 11/16/2016   Procedure: LAPAROSCOPIC CHOLECYSTECTOMY WITH INTRAOPERATIVE CHOLANGIOGRAM;  Surgeon: Leonie Green, MD;  Location: [facility] ORS;  Service: General;  Laterality: N/A;   DIAGNOSTIC LAPAROSCOPY     ESOPHAGOGASTRODUODENOSCOPY     TONSILLECTOMY     VEIN LIGATION AND STRIPPING      There were no vitals filed for this visit.   Subjective Assessment - 07/26/20 1305    Subjective Pt reported her midback and LBP were gone for 10 days after last session but it came back at less level of pain. The LBP came back at SIJ area. Pt had a sore throat last week and went  to the ED and was tested neg for COVID and she took antibiotics and felt bettet the next day after taking antibiotics. Pt has not had a vaccine.    Pertinent History Gyn Hx: abdominal hysterectomy.  Additional surgery: appendix and gall bladder removal.    Patient Stated Goals less pain,              [facility] PT Assessment - 07/26/20 1310      AROM   Overall AROM Comments spinal movements restored, slight pain at midback with rotation       Palpation   SI assessment  L iliac crest/ R shoulder higher , R convex curve upper lumbar ,  ( post Tx:  levelled iliac crest)     Palpation comment tightness intercostal mm T5-10, teres minor, protraction mm,  hypomobile T/L junction                         [facility] Adult PT Treatment/Exercise - 07/26/20 1345      Neuro Re-ed    Neuro Re-ed Details  cued for technique in new HEP       Exercises   Exercises --   see pt instructions, provided explanation  Manual Therapy   Manual therapy comments intercostal mm T5-10, tesres minor, protraction mm with STM/MWM,  medial mob along T/L junction                       PT Long Term Goals - 07/12/20 1425      PT LONG TERM GOAL #1   Title Pt will increase her FOTO score from 52 pts to > 62 pts in order to improve lumbar function    Time 8    Period Weeks    Status New    Target Date 09/06/20      PT LONG TERM GOAL #2   Title Pt will demo no hypomobility at T spine T3-10 and equal pelvic alignment across 2 weeks in order to return to bend and sit    Time 4    Period Weeks    Status New    Target Date 08/09/20      PT LONG TERM GOAL #3   Title Pt will show IND with proper body mechanics with gardening, household chores, and activities in order to return ADLs and minimize injuries    Time 8    Period Weeks    Status New    Target Date 09/06/20                 Plan - 07/26/20 1402    Clinical Impression Statement Pt had a positive response to resolution of  midback and LBP for 10 days. Pt demo'd increased sideflexion without pain bilaterally post Tx today after manual Tx addressed hypomobility at T/L junction and upper lumbar convex curve and uneven shoulder height. Pt demo'd IND with new HEP after cues. New HEP includes scoliosis specific exercises to address asymmetries. Anticipate sidelfxion with mild pain will decrease as throacic/lumbar curves are addressed with HEP.  Pt continues to benefit from skilled PT.    Examination-Activity Limitations Sit;Bend    Rehab Potential Good    PT Frequency 1x / week    PT Duration Other (comment)   10   PT Treatment/Interventions Therapeutic exercise;Therapeutic activities;Functional mobility training;Patient/family education;Balance training;Neuromuscular re-education;Manual techniques;Scar mobilization;Passive range of motion;Moist Heat    Consulted and Agree with Plan of Care Patient           Patient will benefit from skilled therapeutic intervention in order to improve the following deficits and impairments:  Increased fascial restricitons, Decreased range of motion, Decreased endurance, Decreased safety awareness, Increased muscle spasms, Decreased activity tolerance, Pain, Hypomobility, Impaired flexibility, Decreased scar mobility, Postural dysfunction, Decreased mobility, Decreased coordination  Visit Diagnosis: Other lack of coordination  Other muscle spasm  Sacrococcygeal disorders, not elsewhere classified     Problem List Patient Active Problem List   Diagnosis Date Noted   Hyperlipidemia, unspecified 08/29/2018   IGT (impaired glucose tolerance) 08/29/2018   Intrinsic atopic dermatitis 08/29/2018   History of adenomatous polyp of colon 04/01/2018   Elevated liver function tests 01/18/2018   Weight loss 01/15/2017   Pain in limb 12/18/2016   Calculus of gallbladder without cholecystitis without obstruction 10/23/2016   Chronic venous insufficiency 10/16/2016   Varicose  veins of both lower extremities with pain 10/16/2016   Lymphedema 10/16/2016   Epigastric pain 10/03/2016   Barrett's esophagus determined by endoscopy 01/25/2016   Vitamin B12 deficiency 10/18/2015   Chronic neck and back pain 08/19/2015   Body odor 07/09/2015   GAD (generalized anxiety disorder) 07/09/2015   Allergic rhinitis with postnasal drip 07/09/2015  Dyslipidemia 05/05/2015   Acquired hypothyroidism 05/05/2015   Carcinoid tumor 05/05/2015   History of malignant carcinoid tumor of stomach 03/26/2012    Jerl Mina ,PT, DPT, E-RYT  07/26/2020, 2:03 PM  [place] MAIN [facility] SERVICES [address] [place], [place], [address] Phone: [phone]   Fax:  [phone]  Name: EH SAUSEDA MRN: [id number] Date of Birth: 12-06-1960

## 2020-07-26 NOTE — Patient Instructions (Signed)
Lengthen Back rib by _ shoulder    Lie on   side , pillow between knees  Pull  arm overhead over mattress, grab the edge of mattress,pull it upward, drawing elbow away from ears  Breathing 20 reps     Open book  L sidelying rotate 3/4 turn onto pillow  20 reps   __    Side of hip stretch:  Reclined twist for hips and side of the hips/ legs  Lay on your back, knees bend Scoot hips to the L , leave shoulders in place Drop knees to the R side resting onto pillows to keep leg at the same width of hips Pillow under L thigh to minimize too much strain   __  _wall stretch Clock stretch with head turns :  stand perpendicular to the wall, L side to the wall Tilt head to wall  Place L palm at 7 o clock Chin tuck like you are looking into armpit Look at "[place] on giant map  Swivel head with chin tucked to look in upper corner of ceiling as if you are look at  [place] on giant map "   5 reps   Switch direction, R palm on wall at 5 o 'clock   Chin tuck like you are looking into armpit Look at "[place]  on giant map  Swivel head with chin tucked to look in upper corner of ceiling as if you are look at  [facility] on giant map "   10 reps

## 2020-07-26 NOTE — Addendum Note (Signed)
Addended by: Jerl Mina on: 07/26/2020 09:30 AM   Modules accepted: Orders

## 2020-07-28 DIAGNOSIS — J301 Allergic rhinitis due to pollen: Secondary | ICD-10-CM | POA: Diagnosis not present

## 2020-08-03 ENCOUNTER — Other Ambulatory Visit: Payer: Self-pay

## 2020-08-03 ENCOUNTER — Ambulatory Visit: Payer: BC Managed Care – PPO | Attending: Obstetrics and Gynecology | Admitting: Physical Therapy

## 2020-08-03 DIAGNOSIS — R293 Abnormal posture: Secondary | ICD-10-CM | POA: Insufficient documentation

## 2020-08-03 DIAGNOSIS — M62838 Other muscle spasm: Secondary | ICD-10-CM | POA: Diagnosis not present

## 2020-08-03 DIAGNOSIS — M4125 Other idiopathic scoliosis, thoracolumbar region: Secondary | ICD-10-CM | POA: Diagnosis not present

## 2020-08-03 DIAGNOSIS — M533 Sacrococcygeal disorders, not elsewhere classified: Secondary | ICD-10-CM | POA: Diagnosis not present

## 2020-08-03 DIAGNOSIS — R278 Other lack of coordination: Secondary | ICD-10-CM | POA: Insufficient documentation

## 2020-08-03 NOTE — Patient Instructions (Addendum)
° ° °  Stretches :      stand perpendicular to the wall, L side to the wall Tilt head to wall  Place L palm at 7 o clock Chin tuck like you are looking into armpit Look at "[place] on giant map  Swivel head with chin tucked to look in upper corner of ceiling as if you are look at  [place] on giant map "   5 reps   Switch direction, R palm on wall at 5 o 'clock   Chin tuck like you are looking into armpit Look at "[place]  on giant map  Swivel head with chin tucked to look in upper corner of ceiling as if you are look at  [facility] on giant map "   5 reps   __  On your back, tilt chin up and then down And feel your shoulders together and down 10 reps   And also repeat by wall in squat position, pressing elbows and shoulders back

## 2020-08-04 DIAGNOSIS — J301 Allergic rhinitis due to pollen: Secondary | ICD-10-CM | POA: Diagnosis not present

## 2020-08-04 NOTE — Therapy (Signed)
[place] MAIN [facility] SERVICES [address] [place], [place], [address] Phone: [phone]   Fax:  [phone]  Physical Therapy Treatment  Patient Details  Name: Dana Hensley MRN: [id number] Date of Birth: [birth year] Referring Provider (PT): Georgianne Fick MD    Encounter Date: 08/03/2020   PT End of Session - 08/03/20 1311    Visit Number 3    Number of Visits 10    Date for PT Re-Evaluation 09/21/20    PT Start Time 1304    PT Stop Time 1404    PT Time Calculation (min) 60 min    Activity Tolerance Patient tolerated treatment well    Behavior During Therapy [facility] for tasks assessed/performed           Past Medical History:  Diagnosis Date   Allergy    Anxiety    Asthma    B12 deficiency    Barrett esophagus    Chronic atrophic gastritis    DVT, lower extremity ([place])    Gastric polyps    HLD (hyperlipidemia)    Hypothyroidism    IGT (impaired glucose tolerance)    Neuroendocrine tumor    carcinoid tumors of the stomach, s/p extraction surgery at [facility]   PONV (postoperative nausea and vomiting)    Tumor cells, malignant ([place])    Hx of carcinoid tumors of the stomach, s/p extraction surgery at [facility]   Vitamin D deficiency     Past Surgical History:  Procedure Laterality Date   ABDOMINAL HYSTERECTOMY     APPENDECTOMY     CHOLECYSTECTOMY N/A 11/16/2016   Procedure: LAPAROSCOPIC CHOLECYSTECTOMY WITH INTRAOPERATIVE CHOLANGIOGRAM;  Surgeon: Leonie Green, MD;  Location: [facility] ORS;  Service: General;  Laterality: N/A;   DIAGNOSTIC LAPAROSCOPY     ESOPHAGOGASTRODUODENOSCOPY     TONSILLECTOMY     VEIN LIGATION AND STRIPPING      There were no vitals filed for this visit.   Subjective Assessment - 08/03/20 1309    Subjective Pt reported her back feels better. Her arms are feeling pain at the elbows which started this past weekend. It felt like muscles became painful. It hurts when she bends her arms or  holding something. It feels like she has worked too much at [employer].    Pertinent History Gyn Hx: abdominal hysterectomy.  Additional surgery: appendix and gall bladder removal.    Patient Stated Goals less pain,              [facility] PT Assessment - 08/04/20 0914      AROM   Overall AROM Comments cervical rotation R 60deg, L 70 deg, L sideflexion 35 deg, R 55 deg  ( PostTx: L 45 deg)         Strength   Overall Strength Comments R shoulder ext/ wrist ext/flex 4-/5, L 5/5        Palpation   SI assessment  iliac crest levelled, R shoulder higher, less curve at upper lumbar        Palpation comment hypomobile T1-3 , interspinals tight                          [facility] Adult PT Treatment/Exercise - 08/04/20 1696      Neuro Re-ed    Neuro Re-ed Details  cued for stretches alignment and technique . Explained about ergonomics at computer station      Manual Therapy   Manual therapy comments STM/MWM  R  intercostals, medial scap, upper trap, scalenes                        PT Long Term Goals - 07/12/20 1425      PT LONG TERM GOAL #1   Title Pt will increase her FOTO score from 52 pts to > 62 pts in order to improve lumbar function    Time 8    Period Weeks    Status New    Target Date 09/06/20      PT LONG TERM GOAL #2   Title Pt will demo no hypomobility at T spine T3-10 and equal pelvic alignment across 2 weeks in order to return to bend and sit    Time 4    Period Weeks    Status New    Target Date 08/09/20      PT LONG TERM GOAL #3   Title Pt will show IND with proper body mechanics with gardening, household chores, and activities in order to return ADLs and minimize injuries    Time 8    Period Weeks    Status New    Target Date 09/06/20                 Plan - 08/03/20 1311    Clinical Impression Statement There is good carry over with equal pelvic alignment and less lumbar convex curve across past sessions. Pt demo'd increased  cervical/ scapular mobility on R, increased mobility at upper thoracic segments after manual Tx. Pt demo'd less thoracic convex curve post Tx. Educated on ergonomics to computer station.  Pt continues to benefit fro, skilled PT.    Examination-Activity Limitations Sit;Bend    Rehab Potential Good    PT Frequency 1x / week    PT Duration Other (comment)   10   PT Treatment/Interventions Therapeutic exercise;Therapeutic activities;Functional mobility training;Patient/family education;Balance training;Neuromuscular re-education;Manual techniques;Scar mobilization;Passive range of motion;Moist Heat    Consulted and Agree with Plan of Care Patient           Patient will benefit from skilled therapeutic intervention in order to improve the following deficits and impairments:  Increased fascial restricitons, Decreased range of motion, Decreased endurance, Decreased safety awareness, Increased muscle spasms, Decreased activity tolerance, Pain, Hypomobility, Impaired flexibility, Decreased scar mobility, Postural dysfunction, Decreased mobility, Decreased coordination  Visit Diagnosis: Other lack of coordination  Other muscle spasm  Sacrococcygeal disorders, not elsewhere classified     Problem List Patient Active Problem List   Diagnosis Date Noted   Hyperlipidemia, unspecified 08/29/2018   IGT (impaired glucose tolerance) 08/29/2018   Intrinsic atopic dermatitis 08/29/2018   History of adenomatous polyp of colon 04/01/2018   Elevated liver function tests 01/18/2018   Weight loss 01/15/2017   Pain in limb 12/18/2016   Calculus of gallbladder without cholecystitis without obstruction 10/23/2016   Chronic venous insufficiency 10/16/2016   Varicose veins of both lower extremities with pain 10/16/2016   Lymphedema 10/16/2016   Epigastric pain 10/03/2016   Barrett's esophagus determined by endoscopy 01/25/2016   Vitamin B12 deficiency 10/18/2015   Chronic neck and back pain  08/19/2015   Body odor 07/09/2015   GAD (generalized anxiety disorder) 07/09/2015   Allergic rhinitis with postnasal drip 07/09/2015   Dyslipidemia 05/05/2015   Acquired hypothyroidism 05/05/2015   Carcinoid tumor 05/05/2015   History of malignant carcinoid tumor of stomach 03/26/2012    Jerl Mina ,PT, DPT, E-RYT  08/04/2020, 9:15 AM  [place]  [place] MAIN [facility] SERVICES [place], [place], [address] Phone: [phone]   Fax:  [phone]  Name: LAQUEISHA CATALINA MRN: [id number] Date of Birth: 02-Oct-1961

## 2020-08-05 DIAGNOSIS — Z86718 Personal history of other venous thrombosis and embolism: Secondary | ICD-10-CM | POA: Diagnosis not present

## 2020-08-05 DIAGNOSIS — D3A8 Other benign neuroendocrine tumors: Secondary | ICD-10-CM | POA: Diagnosis not present

## 2020-08-05 DIAGNOSIS — Z881 Allergy status to other antibiotic agents status: Secondary | ICD-10-CM | POA: Diagnosis not present

## 2020-08-05 DIAGNOSIS — K2950 Unspecified chronic gastritis without bleeding: Secondary | ICD-10-CM | POA: Diagnosis not present

## 2020-08-05 DIAGNOSIS — Z888 Allergy status to other drugs, medicaments and biological substances status: Secondary | ICD-10-CM | POA: Diagnosis not present

## 2020-08-05 DIAGNOSIS — D175 Benign lipomatous neoplasm of intra-abdominal organs: Secondary | ICD-10-CM | POA: Diagnosis not present

## 2020-08-05 DIAGNOSIS — K3189 Other diseases of stomach and duodenum: Secondary | ICD-10-CM | POA: Diagnosis not present

## 2020-08-05 DIAGNOSIS — K449 Diaphragmatic hernia without obstruction or gangrene: Secondary | ICD-10-CM | POA: Diagnosis not present

## 2020-08-05 DIAGNOSIS — Z8719 Personal history of other diseases of the digestive system: Secondary | ICD-10-CM | POA: Diagnosis not present

## 2020-08-05 DIAGNOSIS — Z91040 Latex allergy status: Secondary | ICD-10-CM | POA: Diagnosis not present

## 2020-08-05 DIAGNOSIS — Z9049 Acquired absence of other specified parts of digestive tract: Secondary | ICD-10-CM | POA: Diagnosis not present

## 2020-08-05 DIAGNOSIS — D3A092 Benign carcinoid tumor of the stomach: Secondary | ICD-10-CM | POA: Diagnosis not present

## 2020-08-06 DIAGNOSIS — J301 Allergic rhinitis due to pollen: Secondary | ICD-10-CM | POA: Diagnosis not present

## 2020-08-09 ENCOUNTER — Ambulatory Visit: Payer: BC Managed Care – PPO | Admitting: Physical Therapy

## 2020-08-09 ENCOUNTER — Other Ambulatory Visit: Payer: Self-pay

## 2020-08-09 DIAGNOSIS — R278 Other lack of coordination: Secondary | ICD-10-CM | POA: Diagnosis not present

## 2020-08-09 DIAGNOSIS — M4125 Other idiopathic scoliosis, thoracolumbar region: Secondary | ICD-10-CM | POA: Diagnosis not present

## 2020-08-09 DIAGNOSIS — M62838 Other muscle spasm: Secondary | ICD-10-CM

## 2020-08-09 DIAGNOSIS — M533 Sacrococcygeal disorders, not elsewhere classified: Secondary | ICD-10-CM

## 2020-08-09 DIAGNOSIS — R293 Abnormal posture: Secondary | ICD-10-CM | POA: Diagnosis not present

## 2020-08-09 NOTE — Therapy (Signed)
[place] MAIN [facility] SERVICES [address] [place], [place], [address] Phone: [phone]   Fax:  [phone]  Physical Therapy Treatment  Patient Details  Name: Dana Hensley MRN: [id number] Date of Birth: 1961/06/21 Referring Provider (PT): Georgianne Fick MD    Encounter Date: 08/09/2020   PT End of Session - 08/09/20 1122    Visit Number 4    Number of Visits 10    Date for PT Re-Evaluation 09/21/20    PT Start Time 1107    PT Stop Time 1204    PT Time Calculation (min) 57 min    Activity Tolerance Patient tolerated treatment well    Behavior During Therapy [facility] for tasks assessed/performed           Past Medical History:  Diagnosis Date  . Allergy   . Anxiety   . Asthma   . B12 deficiency   . Barrett esophagus   . Chronic atrophic gastritis   . DVT, lower extremity ([place])   . Gastric polyps   . HLD (hyperlipidemia)   . Hypothyroidism   . IGT (impaired glucose tolerance)   . Neuroendocrine tumor    carcinoid tumors of the stomach, s/p extraction surgery at [facility]  . PONV (postoperative nausea and vomiting)   . Tumor cells, malignant ([place])    Hx of carcinoid tumors of the stomach, s/p extraction surgery at [facility]  . Vitamin D deficiency     Past Surgical History:  Procedure Laterality Date  . ABDOMINAL HYSTERECTOMY    . APPENDECTOMY    . CHOLECYSTECTOMY N/A 11/16/2016   Procedure: LAPAROSCOPIC CHOLECYSTECTOMY WITH INTRAOPERATIVE CHOLANGIOGRAM;  Surgeon: Leonie Green, MD;  Location: [facility] ORS;  Service: General;  Laterality: N/A;  . DIAGNOSTIC LAPAROSCOPY    . ESOPHAGOGASTRODUODENOSCOPY    . TONSILLECTOMY    . VEIN LIGATION AND STRIPPING      There were no vitals filed for this visit.   Subjective Assessment - 08/09/20 1112    Subjective Pt reports she no longer has the numbness and tingling in her elbow and the midback pain after last treatment. Low back pain was really mild.  Pt is very pleased about her treatments.      Pertinent History Gyn Hx: abdominal hysterectomy.  Additional surgery: appendix and gall bladder removal.    Patient Stated Goals less pain,              [facility] PT Assessment - 08/09/20 1113      AROM   Overall AROM Comments cervical sideflexion 40 deg R, 45 deg L with pain,  rotation 55 deg B       Strength   Overall Strength Comments BUE 5/5        Palpation   SI assessment  iliac crest levelled,  shoulders more levelled, no more curves in spine      Palpation comment T5-10 hypomobile, tightness at medial scap mm attachments, intercostals B                           [facility] Adult PT Treatment/Exercise - 08/09/20 1237      Neuro Re-ed    Neuro Re-ed Details  cued for neck ROM and scapular mobility       Moist Heat Therapy   Number Minutes Moist Heat 5 Minutes    Moist Heat Location --   thoracic      Manual Therapy   Manual therapy comments STM/MWM  B medial scap mm attachments, intercostals B , PA mob T spinal segments to promote extensions                        PT Long Term Goals - 07/12/20 1425      PT LONG TERM GOAL #1   Title Pt will increase her FOTO score from 52 pts to > 62 pts in order to improve lumbar function    Time 8    Period Weeks    Status New    Target Date 09/06/20      PT LONG TERM GOAL #2   Title Pt will demo no hypomobility at T spine T3-10 and equal pelvic alignment across 2 weeks in order to return to bend and sit    Time 4    Period Weeks    Status New    Target Date 08/09/20      PT LONG TERM GOAL #3   Title Pt will show IND with proper body mechanics with gardening, household chores, and activities in order to return ADLs and minimize injuries    Time 8    Period Weeks    Status New    Target Date 09/06/20                 Plan - 08/09/20 1122    Clinical Impression Statement Pt showed the following improvements with no more spinal deviations, more levelled shoulders and pelvic girdle, and  increased RUE strength with report of no more N/T along arm.  Pt continued to require manual to minimize tightness along medial scapula and intercostals and hypomobility at upper T- spine segments. Plan to initiate cervical/scapular strengthening at next session. Pt continues to benefit from skilled PT    Examination-Activity Limitations Sit;Bend    Rehab Potential Good    PT Frequency 1x / week    PT Duration Other (comment)   10   PT Treatment/Interventions Therapeutic exercise;Therapeutic activities;Functional mobility training;Patient/family education;Balance training;Neuromuscular re-education;Manual techniques;Scar mobilization;Passive range of motion;Moist Heat    Consulted and Agree with Plan of Care Patient           Patient will benefit from skilled therapeutic intervention in order to improve the following deficits and impairments:  Increased fascial restricitons, Decreased range of motion, Decreased endurance, Decreased safety awareness, Increased muscle spasms, Decreased activity tolerance, Pain, Hypomobility, Impaired flexibility, Decreased scar mobility, Postural dysfunction, Decreased mobility, Decreased coordination  Visit Diagnosis: Other lack of coordination  Other muscle spasm  Sacrococcygeal disorders, not elsewhere classified     Problem List Patient Active Problem List   Diagnosis Date Noted  . Hyperlipidemia, unspecified 08/29/2018  . IGT (impaired glucose tolerance) 08/29/2018  . Intrinsic atopic dermatitis 08/29/2018  . History of adenomatous polyp of colon 04/01/2018  . Elevated liver function tests 01/18/2018  . Weight loss 01/15/2017  . Pain in limb 12/18/2016  . Calculus of gallbladder without cholecystitis without obstruction 10/23/2016  . Chronic venous insufficiency 10/16/2016  . Varicose veins of both lower extremities with pain 10/16/2016  . Lymphedema 10/16/2016  . Epigastric pain 10/03/2016  . Barrett's esophagus determined by endoscopy  01/25/2016  . Vitamin B12 deficiency 10/18/2015  . Chronic neck and back pain 08/19/2015  . Body odor 07/09/2015  . GAD (generalized anxiety disorder) 07/09/2015  . Allergic rhinitis with postnasal drip 07/09/2015  . Dyslipidemia 05/05/2015  . Acquired hypothyroidism 05/05/2015  . Carcinoid tumor 05/05/2015  . History of malignant carcinoid tumor of stomach  03/26/2012    Jerl Mina ,PT, DPT, E-RYT  08/09/2020, 12:39 PM  [place] MAIN [facility] SERVICES [address] [place], [place], [address] Phone: [phone]   Fax:  [phone]  Name: Dana Hensley MRN: [id number] Date of Birth: 04-05-1961

## 2020-08-09 NOTE — Patient Instructions (Signed)
   Neck / shoulder stretches:    Lying on back - small sushi roll towel under neck  _ 6 directions  5 reps  _elbow circles in and out to lower shoulder blades down and back  10 reps  _angel wings, lower elbows down , keep arms touching bed  10 reps     ___ 

## 2020-08-11 DIAGNOSIS — J301 Allergic rhinitis due to pollen: Secondary | ICD-10-CM | POA: Diagnosis not present

## 2020-08-16 ENCOUNTER — Ambulatory Visit: Payer: BC Managed Care – PPO | Admitting: Physical Therapy

## 2020-08-16 ENCOUNTER — Other Ambulatory Visit: Payer: Self-pay

## 2020-08-16 DIAGNOSIS — M62838 Other muscle spasm: Secondary | ICD-10-CM | POA: Diagnosis not present

## 2020-08-16 DIAGNOSIS — R278 Other lack of coordination: Secondary | ICD-10-CM | POA: Diagnosis not present

## 2020-08-16 DIAGNOSIS — M533 Sacrococcygeal disorders, not elsewhere classified: Secondary | ICD-10-CM

## 2020-08-16 DIAGNOSIS — M4125 Other idiopathic scoliosis, thoracolumbar region: Secondary | ICD-10-CM | POA: Diagnosis not present

## 2020-08-16 DIAGNOSIS — R293 Abnormal posture: Secondary | ICD-10-CM | POA: Diagnosis not present

## 2020-08-16 NOTE — Therapy (Signed)
[place] MAIN [facility] SERVICES [address] [place], [place], [address] Phone: [phone]   Fax:  [phone]  Physical Therapy Treatment  Patient Details  Name: Dana Hensley MRN: [id number] Date of Birth: 28-Jan-1961 Referring Provider (PT): Georgianne Fick MD    Encounter Date: 08/16/2020   PT End of Session - 08/16/20 1215    Visit Number 5    Number of Visits 10    Date for PT Re-Evaluation 09/21/20    PT Start Time 1107    PT Stop Time 1155    PT Time Calculation (min) 48 min    Activity Tolerance Patient tolerated treatment well    Behavior During Therapy [facility] for tasks assessed/performed           Past Medical History:  Diagnosis Date  . Allergy   . Anxiety   . Asthma   . B12 deficiency   . Barrett esophagus   . Chronic atrophic gastritis   . DVT, lower extremity ([place])   . Gastric polyps   . HLD (hyperlipidemia)   . Hypothyroidism   . IGT (impaired glucose tolerance)   . Neuroendocrine tumor    carcinoid tumors of the stomach, s/p extraction surgery at [facility]  . PONV (postoperative nausea and vomiting)   . Tumor cells, malignant ([place])    Hx of carcinoid tumors of the stomach, s/p extraction surgery at [facility]  . Vitamin D deficiency     Past Surgical History:  Procedure Laterality Date  . ABDOMINAL HYSTERECTOMY    . APPENDECTOMY    . CHOLECYSTECTOMY N/A 11/16/2016   Procedure: LAPAROSCOPIC CHOLECYSTECTOMY WITH INTRAOPERATIVE CHOLANGIOGRAM;  Surgeon: Leonie Green, MD;  Location: [facility] ORS;  Service: General;  Laterality: N/A;  . DIAGNOSTIC LAPAROSCOPY    . ESOPHAGOGASTRODUODENOSCOPY    . TONSILLECTOMY    . VEIN LIGATION AND STRIPPING      There were no vitals filed for this visit.   Subjective Assessment - 08/16/20 1110    Subjective Pt reports no pain in the low back. The middle back and up was increased slightly after shopping groceries and carries some heavy bags. Prior this, midback felt good after last session  for 4 days.    Pertinent History Gyn Hx: abdominal hysterectomy.  Additional surgery: appendix and gall bladder removal.    Patient Stated Goals less pain,              [facility] PT Assessment - 08/16/20 1120      Palpation   Spinal mobility hypomobile T1-3,      Palpation comment tightness rhomoboids R                          [facility] Adult PT Treatment/Exercise - 08/16/20 1213      Neuro Re-ed    Neuro Re-ed Details  excessive cues for cervical/ scapular retraction in new HEP       Exercises   Exercises --   new strengtehning HEP see pt instructions     Moist Heat Therapy   Moist Heat Location --   thoracic / cervical      Manual Therapy   Manual therapy comments STM/MWM rhomoboids, PA mob Grade III T3-10 , distraction at cervical spine                        PT Long Term Goals - 07/12/20 1425      PT LONG TERM  GOAL #1   Title Pt will increase her FOTO score from 52 pts to > 62 pts in order to improve lumbar function    Time 8    Period Weeks    Status New    Target Date 09/06/20      PT LONG TERM GOAL #2   Title Pt will demo no hypomobility at T spine T3-10 and equal pelvic alignment across 2 weeks in order to return to bend and sit    Time 4    Period Weeks    Status New    Target Date 08/09/20      PT LONG TERM GOAL #3   Title Pt will show IND with proper body mechanics with gardening, household chores, and activities in order to return ADLs and minimize injuries    Time 8    Period Weeks    Status New    Target Date 09/06/20                 Plan - 08/16/20 1215    Clinical Impression Statement Pt progressed to cervical/ scapular retraction strengtehning exercises in hooklying and upright positions. Pt demo'd less forward head and less dowagers hump post Tx. Pt requried excessive cues for cervical retraction. Pt continues to benefit from skilled PT    Examination-Activity Limitations Sit;Bend    Rehab Potential Good    PT  Frequency 1x / week    PT Duration Other (comment)   10   PT Treatment/Interventions Therapeutic exercise;Therapeutic activities;Functional mobility training;Patient/family education;Balance training;Neuromuscular re-education;Manual techniques;Scar mobilization;Passive range of motion;Moist Heat    Consulted and Agree with Plan of Care Patient           Patient will benefit from skilled therapeutic intervention in order to improve the following deficits and impairments:  Increased fascial restricitons, Decreased range of motion, Decreased endurance, Decreased safety awareness, Increased muscle spasms, Decreased activity tolerance, Pain, Hypomobility, Impaired flexibility, Decreased scar mobility, Postural dysfunction, Decreased mobility, Decreased coordination  Visit Diagnosis: Other lack of coordination  Other muscle spasm  Sacrococcygeal disorders, not elsewhere classified     Problem List Patient Active Problem List   Diagnosis Date Noted  . Hyperlipidemia, unspecified 08/29/2018  . IGT (impaired glucose tolerance) 08/29/2018  . Intrinsic atopic dermatitis 08/29/2018  . History of adenomatous polyp of colon 04/01/2018  . Elevated liver function tests 01/18/2018  . Weight loss 01/15/2017  . Pain in limb 12/18/2016  . Calculus of gallbladder without cholecystitis without obstruction 10/23/2016  . Chronic venous insufficiency 10/16/2016  . Varicose veins of both lower extremities with pain 10/16/2016  . Lymphedema 10/16/2016  . Epigastric pain 10/03/2016  . Barrett's esophagus determined by endoscopy 01/25/2016  . Vitamin B12 deficiency 10/18/2015  . Chronic neck and back pain 08/19/2015  . Body odor 07/09/2015  . GAD (generalized anxiety disorder) 07/09/2015  . Allergic rhinitis with postnasal drip 07/09/2015  . Dyslipidemia 05/05/2015  . Acquired hypothyroidism 05/05/2015  . Carcinoid tumor 05/05/2015  . History of malignant carcinoid tumor of stomach 03/26/2012     Jerl Mina ,PT, DPT, E-RYT  08/16/2020, 12:23 PM  [place] MAIN [facility] SERVICES [address] [place], [place], [address] Phone: [phone]   Fax:  [phone]  Name: Dana Hensley MRN: [id number] Date of Birth: 30-Jun-1961

## 2020-08-16 NOTE — Patient Instructions (Signed)
Lying on back, knees bent    band under ballmounds  while laying on back w/ knees bent  "W" exercise  10 reps x 2 sets   Band is placed under feet, knees bent, feet are hip width apart Hold band with thumbs point out, keep upper arm and elbow touching the bed the whole time  - inhale and then exhale pull bands by bending elbows hands move in a "w"  (feel shoulder blades squeezing)    _____________________________   Dolphin plank with fists together, on wall Mini squat,  Chin tucked, as you squat and straight tall Do not move the chin 20  reps

## 2020-08-18 DIAGNOSIS — J301 Allergic rhinitis due to pollen: Secondary | ICD-10-CM | POA: Diagnosis not present

## 2020-08-23 ENCOUNTER — Other Ambulatory Visit: Payer: Self-pay

## 2020-08-23 ENCOUNTER — Other Ambulatory Visit: Payer: Self-pay | Admitting: Nurse Practitioner

## 2020-08-23 ENCOUNTER — Ambulatory Visit: Payer: BC Managed Care – PPO | Admitting: Physical Therapy

## 2020-08-23 DIAGNOSIS — M533 Sacrococcygeal disorders, not elsewhere classified: Secondary | ICD-10-CM

## 2020-08-23 DIAGNOSIS — Z1231 Encounter for screening mammogram for malignant neoplasm of breast: Secondary | ICD-10-CM

## 2020-08-23 DIAGNOSIS — M4125 Other idiopathic scoliosis, thoracolumbar region: Secondary | ICD-10-CM | POA: Diagnosis not present

## 2020-08-23 DIAGNOSIS — M62838 Other muscle spasm: Secondary | ICD-10-CM

## 2020-08-23 DIAGNOSIS — R278 Other lack of coordination: Secondary | ICD-10-CM | POA: Diagnosis not present

## 2020-08-23 DIAGNOSIS — R293 Abnormal posture: Secondary | ICD-10-CM | POA: Diagnosis not present

## 2020-08-23 NOTE — Therapy (Signed)
[place] MAIN [facility] SERVICES [address] [place], [place], [address] Phone: [phone]   Fax:  [phone]  Physical Therapy Treatment  Patient Details  Name: Dana Hensley MRN: [id number] Date of Birth: 1961/03/03 Referring Provider (PT): Georgianne Fick MD    Encounter Date: 08/23/2020   PT End of Session - 08/23/20 1108    Visit Number 6    Number of Visits 10    Date for PT Re-Evaluation 09/21/20    PT Start Time 1105    PT Stop Time 1200    PT Time Calculation (min) 55 min    Activity Tolerance Patient tolerated treatment well    Behavior During Therapy [facility] for tasks assessed/performed           Past Medical History:  Diagnosis Date  . Allergy   . Anxiety   . Asthma   . B12 deficiency   . Barrett esophagus   . Chronic atrophic gastritis   . DVT, lower extremity ([place])   . Gastric polyps   . HLD (hyperlipidemia)   . Hypothyroidism   . IGT (impaired glucose tolerance)   . Neuroendocrine tumor    carcinoid tumors of the stomach, s/p extraction surgery at [facility]  . PONV (postoperative nausea and vomiting)   . Tumor cells, malignant ([place])    Hx of carcinoid tumors of the stomach, s/p extraction surgery at [facility]  . Vitamin D deficiency     Past Surgical History:  Procedure Laterality Date  . ABDOMINAL HYSTERECTOMY    . APPENDECTOMY    . CHOLECYSTECTOMY N/A 11/16/2016   Procedure: LAPAROSCOPIC CHOLECYSTECTOMY WITH INTRAOPERATIVE CHOLANGIOGRAM;  Surgeon: Leonie Green, MD;  Location: [facility] ORS;  Service: General;  Laterality: N/A;  . DIAGNOSTIC LAPAROSCOPY    . ESOPHAGOGASTRODUODENOSCOPY    . TONSILLECTOMY    . VEIN LIGATION AND STRIPPING      There were no vitals filed for this visit.   Subjective Assessment - 08/23/20 1108    Subjective Pt reports midback is 3-4 /10 pain level and neck is 4-5 /10 pain. No increased pain with new exercises . They are helpful    Pertinent History Gyn Hx: abdominal hysterectomy.   Additional surgery: appendix and gall bladder removal.    Patient Stated Goals less pain,              [facility] PT Assessment - 08/23/20 1112      Palpation   Spinal mobility hypomobile T1-3,  R deviation, tightness interspinals, intercostals, medial scapula mm                           [facility] Adult PT Treatment/Exercise - 08/23/20 1202      Neuro Re-ed    Neuro Re-ed Details  cued for scapular/ cervical retraction in new HEP       Moist Heat Therapy   Number Minutes Moist Heat 5 Minutes    Moist Heat Location --   thoracic      Manual Therapy   Manual therapy comments STM/MWM medial scap/ intercostals , PA mob Grade III T3-10 , distraction at cervical spine                        PT Long Term Goals - 07/12/20 1425      PT LONG TERM GOAL #1   Title Pt will increase her FOTO score from 52 pts to > 62 pts in  order to improve lumbar function    Time 8    Period Weeks    Status New    Target Date 09/06/20      PT LONG TERM GOAL #2   Title Pt will demo no hypomobility at T spine T3-10 and equal pelvic alignment across 2 weeks in order to return to bend and sit    Time 4    Period Weeks    Status New    Target Date 08/09/20      PT LONG TERM GOAL #3   Title Pt will show IND with proper body mechanics with gardening, household chores, and activities in order to return ADLs and minimize injuries    Time 8    Period Weeks    Status New    Target Date 09/06/20                 Plan - 08/23/20 1201    Clinical Impression Statement Pt continues to make great progress with decreasing numbness/ tingling in her arm, decreasing neck and thoracic pain. Pt required manual Tx to continue increasing mobility in upper thoracic segments and decreasing R scapular mm tightness. Advanced pt to against gravity exercise and pt required cues for scapular and cervical retraction/ depression. Pt continues to benefit from skilled PT    Examination-Activity  Limitations Sit;Bend    Rehab Potential Good    PT Frequency 1x / week    PT Duration Other (comment)   10   PT Treatment/Interventions Therapeutic exercise;Therapeutic activities;Functional mobility training;Patient/family education;Balance training;Neuromuscular re-education;Manual techniques;Scar mobilization;Passive range of motion;Moist Heat    Consulted and Agree with Plan of Care Patient           Patient will benefit from skilled therapeutic intervention in order to improve the following deficits and impairments:  Increased fascial restricitons, Decreased range of motion, Decreased endurance, Decreased safety awareness, Increased muscle spasms, Decreased activity tolerance, Pain, Hypomobility, Impaired flexibility, Decreased scar mobility, Postural dysfunction, Decreased mobility, Decreased coordination  Visit Diagnosis: Other lack of coordination  Other muscle spasm  Sacrococcygeal disorders, not elsewhere classified     Problem List Patient Active Problem List   Diagnosis Date Noted  . Hyperlipidemia, unspecified 08/29/2018  . IGT (impaired glucose tolerance) 08/29/2018  . Intrinsic atopic dermatitis 08/29/2018  . History of adenomatous polyp of colon 04/01/2018  . Elevated liver function tests 01/18/2018  . Weight loss 01/15/2017  . Pain in limb 12/18/2016  . Calculus of gallbladder without cholecystitis without obstruction 10/23/2016  . Chronic venous insufficiency 10/16/2016  . Varicose veins of both lower extremities with pain 10/16/2016  . Lymphedema 10/16/2016  . Epigastric pain 10/03/2016  . Barrett's esophagus determined by endoscopy 01/25/2016  . Vitamin B12 deficiency 10/18/2015  . Chronic neck and back pain 08/19/2015  . Body odor 07/09/2015  . GAD (generalized anxiety disorder) 07/09/2015  . Allergic rhinitis with postnasal drip 07/09/2015  . Dyslipidemia 05/05/2015  . Acquired hypothyroidism 05/05/2015  . Carcinoid tumor 05/05/2015  . History of  malignant carcinoid tumor of stomach 03/26/2012    Jerl Mina ,PT, DPT, E-RYT  08/23/2020, 12:04 PM  [place] MAIN [facility] SERVICES [address] [place], [place], [address] Phone: [phone]   Fax:  [phone]  Name: JANIE CAPP MRN: [id number] Date of Birth: 1961/11/06

## 2020-08-23 NOTE — Patient Instructions (Signed)
Locust pose  Pillow under hips if needed for decreased low back pain  Palms face midline by hips  Finger tips shooting straight down  Imagine holding pencil under your armpits Draw shoulders away from ears Inhale Exhale lift chest up slightly without feeling it in your back. The bend happens in the midback  (keep chin tucked)   15 reps

## 2020-08-25 DIAGNOSIS — J301 Allergic rhinitis due to pollen: Secondary | ICD-10-CM | POA: Diagnosis not present

## 2020-08-30 ENCOUNTER — Other Ambulatory Visit: Payer: Self-pay

## 2020-08-30 ENCOUNTER — Ambulatory Visit: Payer: BC Managed Care – PPO | Attending: Obstetrics and Gynecology | Admitting: Physical Therapy

## 2020-08-30 DIAGNOSIS — M533 Sacrococcygeal disorders, not elsewhere classified: Secondary | ICD-10-CM | POA: Diagnosis not present

## 2020-08-30 DIAGNOSIS — R278 Other lack of coordination: Secondary | ICD-10-CM | POA: Insufficient documentation

## 2020-08-30 DIAGNOSIS — M62838 Other muscle spasm: Secondary | ICD-10-CM | POA: Insufficient documentation

## 2020-08-30 NOTE — Patient Instructions (Addendum)
  Thread the needle modified   Table top position or standing at counter   toes tucked under for 6 points of contact, hands shoulder width apart   Wrists under shoulder Slide one forearm under and come back to table top. Repeat on this side for 5 reps,.  Switch    ___   Midback/armpit area  dolphin plank position with forearms on wall, feet and hips further away from door to minimize low back sway     Stretching armpit area   Interlace fists, forearm against wall, elbows shoulder width apart  slide up, and down  5 reps        ___    Walking with band, thumbs up and hands held behind back by back pockets , body face away from door  3 steps forward and back   3 mins

## 2020-08-30 NOTE — Therapy (Signed)
[place] MAIN [facility] SERVICES [address] [place], [place], [address] Phone: [phone]   Fax:  [phone]  Physical Therapy Treatment  Patient Details  Name: Dana Hensley MRN: [id number] Date of Birth: 1961/08/03 Referring Provider (PT): Georgianne Fick MD    Encounter Date: 08/30/2020   PT End of Session - 08/30/20 1108    Visit Number 7    Number of Visits 10    Date for PT Re-Evaluation 09/21/20    PT Start Time 1107    PT Stop Time 1200    PT Time Calculation (min) 53 min    Activity Tolerance Patient tolerated treatment well    Behavior During Therapy [facility] for tasks assessed/performed           Past Medical History:  Diagnosis Date  . Allergy   . Anxiety   . Asthma   . B12 deficiency   . Barrett esophagus   . Chronic atrophic gastritis   . DVT, lower extremity ([place])   . Gastric polyps   . HLD (hyperlipidemia)   . Hypothyroidism   . IGT (impaired glucose tolerance)   . Neuroendocrine tumor    carcinoid tumors of the stomach, s/p extraction surgery at [facility]  . PONV (postoperative nausea and vomiting)   . Tumor cells, malignant ([place])    Hx of carcinoid tumors of the stomach, s/p extraction surgery at [facility]  . Vitamin D deficiency     Past Surgical History:  Procedure Laterality Date  . ABDOMINAL HYSTERECTOMY    . APPENDECTOMY    . CHOLECYSTECTOMY N/A 11/16/2016   Procedure: LAPAROSCOPIC CHOLECYSTECTOMY WITH INTRAOPERATIVE CHOLANGIOGRAM;  Surgeon: Leonie Green, MD;  Location: [facility] ORS;  Service: General;  Laterality: N/A;  . DIAGNOSTIC LAPAROSCOPY    . ESOPHAGOGASTRODUODENOSCOPY    . TONSILLECTOMY    . VEIN LIGATION AND STRIPPING      There were no vitals filed for this visit.   Subjective Assessment - 08/30/20 1107    Subjective Pt reports her shoulders are feeling better. She still feel tight at middle back. Pt has been walking better.    Pertinent History Gyn Hx: abdominal hysterectomy.  Additional surgery:  appendix and gall bladder removal.    Patient Stated Goals less pain,              [facility] PT Assessment - 08/30/20 1144      Palpation   Palpation comment tightness at low traps B                          [facility] Adult PT Treatment/Exercise - 08/30/20 1135      Neuro Re-ed    Neuro Re-ed Details  cued for lengthening of  upper back rotation exercise       Moist Heat Therapy   Number Minutes Moist Heat 5 Minutes    Moist Heat Location --   back, in trunk rotated 3/4 onto pillow      Manual Therapy   Manual therapy comments MWM/STM along low traps                        PT Long Term Goals - 07/12/20 1425      PT LONG TERM GOAL #1   Title Pt will increase her FOTO score from 52 pts to > 62 pts in order to improve lumbar function    Time 8    Period Weeks  Status New    Target Date 09/06/20      PT LONG TERM GOAL #2   Title Pt will demo no hypomobility at T spine T3-10 and equal pelvic alignment across 2 weeks in order to return to bend and sit    Time 4    Period Weeks    Status New    Target Date 08/09/20      PT LONG TERM GOAL #3   Title Pt will show IND with proper body mechanics with gardening, household chores, and activities in order to return ADLs and minimize injuries    Time 8    Period Weeks    Status New    Target Date 09/06/20                 Plan - 08/30/20 1108    Clinical Impression Statement Pt demo'd signficantly less neck mm tensions today but required manual Tx to release lower trap tightness. Added rotational stretch in functional positions. Pt was progressed to thoracolumbar strengthening in upright positions. Pt required cues for proper co-activation to depress shoulders and retract cervical spine. Pt demo'd IND following training. Pt continues to benefit from skilled PT   quired  Examination-Activity Limitations Sit;Bend    Rehab Potential Good    PT Frequency 1x / week    PT Duration Other (comment)   10     PT Treatment/Interventions Therapeutic exercise;Therapeutic activities;Functional mobility training;Patient/family education;Balance training;Neuromuscular re-education;Manual techniques;Scar mobilization;Passive range of motion;Moist Heat    Consulted and Agree with Plan of Care Patient           Patient will benefit from skilled therapeutic intervention in order to improve the following deficits and impairments:  Increased fascial restricitons, Decreased range of motion, Decreased endurance, Decreased safety awareness, Increased muscle spasms, Decreased activity tolerance, Pain, Hypomobility, Impaired flexibility, Decreased scar mobility, Postural dysfunction, Decreased mobility, Decreased coordination  Visit Diagnosis: Other lack of coordination  Other muscle spasm  Sacrococcygeal disorders, not elsewhere classified     Problem List Patient Active Problem List   Diagnosis Date Noted  . Hyperlipidemia, unspecified 08/29/2018  . IGT (impaired glucose tolerance) 08/29/2018  . Intrinsic atopic dermatitis 08/29/2018  . History of adenomatous polyp of colon 04/01/2018  . Elevated liver function tests 01/18/2018  . Weight loss 01/15/2017  . Pain in limb 12/18/2016  . Calculus of gallbladder without cholecystitis without obstruction 10/23/2016  . Chronic venous insufficiency 10/16/2016  . Varicose veins of both lower extremities with pain 10/16/2016  . Lymphedema 10/16/2016  . Epigastric pain 10/03/2016  . Barrett's esophagus determined by endoscopy 01/25/2016  . Vitamin B12 deficiency 10/18/2015  . Chronic neck and back pain 08/19/2015  . Body odor 07/09/2015  . GAD (generalized anxiety disorder) 07/09/2015  . Allergic rhinitis with postnasal drip 07/09/2015  . Dyslipidemia 05/05/2015  . Acquired hypothyroidism 05/05/2015  . Carcinoid tumor 05/05/2015  . History of malignant carcinoid tumor of stomach 03/26/2012    Jerl Mina ,PT, DPT, E-RYT  08/30/2020, 11:50  AM  [place] MAIN [facility] SERVICES [address] [place], [place], [address] Phone: [phone]   Fax:  [phone]  Name: Dana Hensley MRN: [id number] Date of Birth: [birth year]

## 2020-09-01 DIAGNOSIS — J301 Allergic rhinitis due to pollen: Secondary | ICD-10-CM | POA: Diagnosis not present

## 2020-09-06 ENCOUNTER — Ambulatory Visit: Payer: BC Managed Care – PPO | Admitting: Physical Therapy

## 2020-09-06 DIAGNOSIS — M9901 Segmental and somatic dysfunction of cervical region: Secondary | ICD-10-CM | POA: Diagnosis not present

## 2020-09-06 DIAGNOSIS — M9903 Segmental and somatic dysfunction of lumbar region: Secondary | ICD-10-CM | POA: Diagnosis not present

## 2020-09-06 DIAGNOSIS — M9902 Segmental and somatic dysfunction of thoracic region: Secondary | ICD-10-CM | POA: Diagnosis not present

## 2020-09-06 DIAGNOSIS — M9907 Segmental and somatic dysfunction of upper extremity: Secondary | ICD-10-CM | POA: Diagnosis not present

## 2020-09-07 NOTE — Progress Notes (Signed)
Primary Care Physician: Sallee Lange, NP  Primary Gastroenterologist:  Dr. [name]  Chief Complaint  Patient presents with  . Follow-up    EUS results    HPI: Dana Hensley is a 59 y.o. female here for follow-up after having an endoscopic ultrasound and had a resection of what appeared to be a carcinoid tumor.  The biopsy showed a submucosal lipoma with intestinal metaplasia but no sign of a neuroendocrine tumor.  The patient has a history of a neuroendocrine tumor and has had endoscopic ultrasound follow-up of the lesion multiple times.  The patient had been started on dicyclomine in the past by me for abdominal discomfort which had improved.  She was told that if her symptoms did not improve or return that a CT scan of the abdomen would be considered. The patient had an upper endoscopy at [facility] back in August with a biopsy of a nodule that was shown to be a 2 mm neuroendocrine tumor and she was set up for a endoscopic ultrasound.  At that time she had multiple lesions removed that showed intestinal metaplasia with a single lipoma but no further sign of any a NET.   Past Medical History:  Diagnosis Date  . Allergy   . Anxiety   . Asthma   . B12 deficiency   . Barrett esophagus   . Chronic atrophic gastritis   . DVT, lower extremity ([place])   . Gastric polyps   . HLD (hyperlipidemia)   . Hypothyroidism   . IGT (impaired glucose tolerance)   . Neuroendocrine tumor    carcinoid tumors of the stomach, s/p extraction surgery at [facility]  . PONV (postoperative nausea and vomiting)   . Tumor cells, malignant ([place])    Hx of carcinoid tumors of the stomach, s/p extraction surgery at [facility]  . Vitamin D deficiency     Current Outpatient Medications  Medication Sig Dispense Refill  . benzonatate (TESSALON) 100 MG capsule Take 1 capsule (100 mg total) by mouth every 8 (eight) hours. 21 capsule 0  . busPIRone (BUSPAR) 10 MG tablet Take 10 mg by mouth 2 (two) times daily.  0  .  cetirizine (ZYRTEC) 10 MG tablet Take 10 mg by mouth daily.    . clobetasol ointment (TEMOVATE) 4.69 % Apply 1 application topically as needed.    . Cyanocobalamin (RA VITAMIN B-12 TR) 1000 MCG TBCR Take by mouth.    . dicyclomine (BENTYL) 10 MG capsule Take 1 capsule (10 mg total) by mouth 4 (four) times daily -  before meals and at bedtime. 90 capsule 3  . EPINEPHrine 0.3 mg/0.3 mL IJ SOAJ injection Inject into the muscle.    . esomeprazole (NEXIUM) 20 MG capsule Take by mouth.    . levothyroxine (SYNTHROID, LEVOTHROID) 75 MCG tablet Take 1 tablet (75 mcg total) by mouth daily. 90 tablet 1  . mometasone (ELOCON) 0.1 % ointment Apply 1 application topically as needed.    . Multiple Vitamin (MULTI-VITAMINS) TABS Take 1 tablet by mouth daily.     . pimecrolimus (ELIDEL) 1 % cream Apply topically.    . Tuberculin-Allergy Syringes (ALLERGIST TRAY 1CC 27GX3/8") 27G X 3/8" 1 ML KIT Allergist Tray Intradermal Bevel 1 mL 27 gauge x 3/8" syringe     No current facility-administered medications for this visit.    Allergies as of 09/08/2020 - Review Complete 09/08/2020  Allergen Reaction Noted  . Latex Swelling 07/09/2015  . Neomycin Other (See Comments) and Rash 07/09/2015  .  Neosporin  [neomycin-bacitracin zn-polymyx] Other (See Comments) 07/09/2015  . Neomycin-polymyxin-gramicidin  07/09/2015  . Other Other (See Comments) 07/09/2015    ROS:  General: Negative for anorexia, weight loss, fever, chills, fatigue, weakness. ENT: Negative for hoarseness, difficulty swallowing , nasal congestion. CV: Negative for chest pain, angina, palpitations, dyspnea on exertion, peripheral edema.  Respiratory: Negative for dyspnea at rest, dyspnea on exertion, cough, sputum, wheezing.  GI: See history of present illness. GU:  Negative for dysuria, hematuria, urinary incontinence, urinary frequency, nocturnal urination.  Endo: Negative for unusual weight change.    Physical Examination:   BP 101/66   Pulse  64   Ht _0  (1.651 m)   Wt 181 lb 3.2 oz (82.2 kg)   BMI 30.15 kg/m   General: Well-nourished, well-developed in no acute distress.  Eyes: No icterus. Conjunctivae pink. Lungs: Clear to auscultation bilaterally. Non-labored. Heart: Regular rate and rhythm, no murmurs rubs or gallops.  Abdomen: Bowel sounds are normal, nontender, nondistended, no hepatosplenomegaly or masses, no abdominal bruits or hernia , no rebound or guarding.   Extremities: No lower extremity edema. No clubbing or deformities. Neuro: Alert and oriented x 3.  Grossly intact. Skin: Warm and dry, no jaundice.   Psych: Alert and cooperative, normal mood and affect.  Labs:    Imaging Studies: No results found.  Assessment and Plan:   Dana Hensley is a 59 y.o. y/o female who comes here today after having an upper endoscopy and EUS with a biopsy of the stomach during the upper endoscopy showing a neuroendocrine tumor but the EUS only removing a lipoma.  I have discussed the case with Dr. Francella Solian and we have agreed upon the patient returning back there for a repeat EGD with valuation of the area of concern with repeat biopsies or resection if needed in 6 months.  I will have the patient contacted and informed of the plan.  The patient has stopped her EPI since her pH of her stomach was determined to be between 6 and 7.  The patient had abnormal liver enzymes in the past and has had low B12 and would like to have her blood sent off for B12 and liver enzymes again.     [name], MD. Marval Regal    Note: This dictation was prepared with Dragon dictation along with smaller phrase technology. Any transcriptional errors that result from this process are unintentional.

## 2020-09-08 ENCOUNTER — Ambulatory Visit (INDEPENDENT_AMBULATORY_CARE_PROVIDER_SITE_OTHER): Payer: BC Managed Care – PPO | Admitting: Gastroenterology

## 2020-09-08 ENCOUNTER — Other Ambulatory Visit: Payer: Self-pay

## 2020-09-08 ENCOUNTER — Encounter: Payer: Self-pay | Admitting: Gastroenterology

## 2020-09-08 VITALS — BP 101/66 | HR 64 | Ht 65.0 in | Wt 181.2 lb

## 2020-09-08 DIAGNOSIS — D519 Vitamin B12 deficiency anemia, unspecified: Secondary | ICD-10-CM | POA: Diagnosis not present

## 2020-09-08 DIAGNOSIS — M9902 Segmental and somatic dysfunction of thoracic region: Secondary | ICD-10-CM | POA: Diagnosis not present

## 2020-09-08 DIAGNOSIS — K589 Irritable bowel syndrome without diarrhea: Secondary | ICD-10-CM | POA: Diagnosis not present

## 2020-09-08 DIAGNOSIS — M9903 Segmental and somatic dysfunction of lumbar region: Secondary | ICD-10-CM | POA: Diagnosis not present

## 2020-09-08 DIAGNOSIS — M9907 Segmental and somatic dysfunction of upper extremity: Secondary | ICD-10-CM | POA: Diagnosis not present

## 2020-09-08 DIAGNOSIS — D500 Iron deficiency anemia secondary to blood loss (chronic): Secondary | ICD-10-CM | POA: Diagnosis not present

## 2020-09-08 DIAGNOSIS — M9901 Segmental and somatic dysfunction of cervical region: Secondary | ICD-10-CM | POA: Diagnosis not present

## 2020-09-08 DIAGNOSIS — R7989 Other specified abnormal findings of blood chemistry: Secondary | ICD-10-CM | POA: Diagnosis not present

## 2020-09-09 LAB — HEPATIC FUNCTION PANEL
ALT: 16 IU/L (ref 0–32)
AST: 17 IU/L (ref 0–40)
Albumin: 4.1 g/dL (ref 3.8–4.9)
Alkaline Phosphatase: 82 IU/L (ref 44–121)
Bilirubin Total: <0.2 mg/dL (ref 0.0–1.2)
Bilirubin, Direct: <0.1 mg/dL (ref 0.00–0.40)
Total Protein: 7.1 g/dL (ref 6.0–8.5)

## 2020-09-09 LAB — B12 AND FOLATE PANEL
Folate: 14.6 ng/mL (ref >3.0)
Vitamin B-12: 804 pg/mL (ref 232–1245)

## 2020-09-13 ENCOUNTER — Ambulatory Visit: Payer: BC Managed Care – PPO | Admitting: Physical Therapy

## 2020-09-13 DIAGNOSIS — M9903 Segmental and somatic dysfunction of lumbar region: Secondary | ICD-10-CM | POA: Diagnosis not present

## 2020-09-13 DIAGNOSIS — M9902 Segmental and somatic dysfunction of thoracic region: Secondary | ICD-10-CM | POA: Diagnosis not present

## 2020-09-13 DIAGNOSIS — M9907 Segmental and somatic dysfunction of upper extremity: Secondary | ICD-10-CM | POA: Diagnosis not present

## 2020-09-13 DIAGNOSIS — M9901 Segmental and somatic dysfunction of cervical region: Secondary | ICD-10-CM | POA: Diagnosis not present

## 2020-09-14 ENCOUNTER — Telehealth: Payer: Self-pay

## 2020-09-14 NOTE — Telephone Encounter (Signed)
Pt notified of results via mychart.  

## 2020-09-14 NOTE — Telephone Encounter (Signed)
-----   Message from [name], MD sent at 09/12/2020  3:57 PM EDT ----- Let the patient know that her liver enzymes and her test for B12 and folate were all normal.

## 2020-09-15 DIAGNOSIS — M9901 Segmental and somatic dysfunction of cervical region: Secondary | ICD-10-CM | POA: Diagnosis not present

## 2020-09-15 DIAGNOSIS — J301 Allergic rhinitis due to pollen: Secondary | ICD-10-CM | POA: Diagnosis not present

## 2020-09-15 DIAGNOSIS — M9902 Segmental and somatic dysfunction of thoracic region: Secondary | ICD-10-CM | POA: Diagnosis not present

## 2020-09-15 DIAGNOSIS — M9907 Segmental and somatic dysfunction of upper extremity: Secondary | ICD-10-CM | POA: Diagnosis not present

## 2020-09-15 DIAGNOSIS — M9903 Segmental and somatic dysfunction of lumbar region: Secondary | ICD-10-CM | POA: Diagnosis not present

## 2020-09-16 DIAGNOSIS — M9901 Segmental and somatic dysfunction of cervical region: Secondary | ICD-10-CM | POA: Diagnosis not present

## 2020-09-16 DIAGNOSIS — M9903 Segmental and somatic dysfunction of lumbar region: Secondary | ICD-10-CM | POA: Diagnosis not present

## 2020-09-16 DIAGNOSIS — M9902 Segmental and somatic dysfunction of thoracic region: Secondary | ICD-10-CM | POA: Diagnosis not present

## 2020-09-16 DIAGNOSIS — M9907 Segmental and somatic dysfunction of upper extremity: Secondary | ICD-10-CM | POA: Diagnosis not present

## 2020-09-20 ENCOUNTER — Encounter: Payer: BC Managed Care – PPO | Admitting: Physical Therapy

## 2020-09-22 DIAGNOSIS — J301 Allergic rhinitis due to pollen: Secondary | ICD-10-CM | POA: Diagnosis not present

## 2020-09-22 DIAGNOSIS — M9903 Segmental and somatic dysfunction of lumbar region: Secondary | ICD-10-CM | POA: Diagnosis not present

## 2020-09-22 DIAGNOSIS — M9901 Segmental and somatic dysfunction of cervical region: Secondary | ICD-10-CM | POA: Diagnosis not present

## 2020-09-22 DIAGNOSIS — M9902 Segmental and somatic dysfunction of thoracic region: Secondary | ICD-10-CM | POA: Diagnosis not present

## 2020-09-22 DIAGNOSIS — M9907 Segmental and somatic dysfunction of upper extremity: Secondary | ICD-10-CM | POA: Diagnosis not present

## 2020-09-23 DIAGNOSIS — M9907 Segmental and somatic dysfunction of upper extremity: Secondary | ICD-10-CM | POA: Diagnosis not present

## 2020-09-23 DIAGNOSIS — M9901 Segmental and somatic dysfunction of cervical region: Secondary | ICD-10-CM | POA: Diagnosis not present

## 2020-09-23 DIAGNOSIS — M9902 Segmental and somatic dysfunction of thoracic region: Secondary | ICD-10-CM | POA: Diagnosis not present

## 2020-09-23 DIAGNOSIS — M9903 Segmental and somatic dysfunction of lumbar region: Secondary | ICD-10-CM | POA: Diagnosis not present

## 2020-09-27 ENCOUNTER — Other Ambulatory Visit: Payer: Self-pay | Admitting: Gastroenterology

## 2020-09-27 DIAGNOSIS — M9907 Segmental and somatic dysfunction of upper extremity: Secondary | ICD-10-CM | POA: Diagnosis not present

## 2020-09-27 DIAGNOSIS — M9903 Segmental and somatic dysfunction of lumbar region: Secondary | ICD-10-CM | POA: Diagnosis not present

## 2020-09-27 DIAGNOSIS — M9901 Segmental and somatic dysfunction of cervical region: Secondary | ICD-10-CM | POA: Diagnosis not present

## 2020-09-27 DIAGNOSIS — M9902 Segmental and somatic dysfunction of thoracic region: Secondary | ICD-10-CM | POA: Diagnosis not present

## 2020-09-29 DIAGNOSIS — M9907 Segmental and somatic dysfunction of upper extremity: Secondary | ICD-10-CM | POA: Diagnosis not present

## 2020-09-29 DIAGNOSIS — M9903 Segmental and somatic dysfunction of lumbar region: Secondary | ICD-10-CM | POA: Diagnosis not present

## 2020-09-29 DIAGNOSIS — M9901 Segmental and somatic dysfunction of cervical region: Secondary | ICD-10-CM | POA: Diagnosis not present

## 2020-09-29 DIAGNOSIS — M9902 Segmental and somatic dysfunction of thoracic region: Secondary | ICD-10-CM | POA: Diagnosis not present

## 2020-09-29 DIAGNOSIS — J301 Allergic rhinitis due to pollen: Secondary | ICD-10-CM | POA: Diagnosis not present

## 2020-09-30 DIAGNOSIS — M9907 Segmental and somatic dysfunction of upper extremity: Secondary | ICD-10-CM | POA: Diagnosis not present

## 2020-09-30 DIAGNOSIS — M9903 Segmental and somatic dysfunction of lumbar region: Secondary | ICD-10-CM | POA: Diagnosis not present

## 2020-09-30 DIAGNOSIS — M9902 Segmental and somatic dysfunction of thoracic region: Secondary | ICD-10-CM | POA: Diagnosis not present

## 2020-09-30 DIAGNOSIS — M9901 Segmental and somatic dysfunction of cervical region: Secondary | ICD-10-CM | POA: Diagnosis not present

## 2020-10-06 DIAGNOSIS — M9901 Segmental and somatic dysfunction of cervical region: Secondary | ICD-10-CM | POA: Diagnosis not present

## 2020-10-06 DIAGNOSIS — M9903 Segmental and somatic dysfunction of lumbar region: Secondary | ICD-10-CM | POA: Diagnosis not present

## 2020-10-06 DIAGNOSIS — M9907 Segmental and somatic dysfunction of upper extremity: Secondary | ICD-10-CM | POA: Diagnosis not present

## 2020-10-06 DIAGNOSIS — M9902 Segmental and somatic dysfunction of thoracic region: Secondary | ICD-10-CM | POA: Diagnosis not present

## 2020-10-06 DIAGNOSIS — J301 Allergic rhinitis due to pollen: Secondary | ICD-10-CM | POA: Diagnosis not present

## 2020-10-13 DIAGNOSIS — J301 Allergic rhinitis due to pollen: Secondary | ICD-10-CM | POA: Diagnosis not present

## 2020-10-25 DIAGNOSIS — M7918 Myalgia, other site: Secondary | ICD-10-CM | POA: Diagnosis not present

## 2020-10-25 DIAGNOSIS — M9902 Segmental and somatic dysfunction of thoracic region: Secondary | ICD-10-CM | POA: Diagnosis not present

## 2020-10-25 DIAGNOSIS — M4605 Spinal enthesopathy, thoracolumbar region: Secondary | ICD-10-CM | POA: Diagnosis not present

## 2020-10-27 ENCOUNTER — Ambulatory Visit
Admission: RE | Admit: 2020-10-27 | Discharge: 2020-10-27 | Disposition: A | Discharge status: Home / Self Care | Payer: BC Managed Care – PPO | Source: Ambulatory Visit | Attending: Nurse Practitioner | Admitting: Nurse Practitioner

## 2020-10-27 ENCOUNTER — Other Ambulatory Visit: Payer: Self-pay

## 2020-10-27 DIAGNOSIS — Z1231 Encounter for screening mammogram for malignant neoplasm of breast: Secondary | ICD-10-CM | POA: Insufficient documentation

## 2020-10-27 DIAGNOSIS — J301 Allergic rhinitis due to pollen: Secondary | ICD-10-CM | POA: Diagnosis not present

## 2020-11-03 DIAGNOSIS — E538 Deficiency of other specified B group vitamins: Secondary | ICD-10-CM | POA: Diagnosis not present

## 2020-11-03 DIAGNOSIS — E782 Mixed hyperlipidemia: Secondary | ICD-10-CM | POA: Diagnosis not present

## 2020-11-03 DIAGNOSIS — J301 Allergic rhinitis due to pollen: Secondary | ICD-10-CM | POA: Diagnosis not present

## 2020-11-03 DIAGNOSIS — Z0000 Encounter for general adult medical examination without abnormal findings: Secondary | ICD-10-CM | POA: Diagnosis not present

## 2020-11-03 DIAGNOSIS — R7302 Impaired glucose tolerance (oral): Secondary | ICD-10-CM | POA: Diagnosis not present

## 2020-11-03 DIAGNOSIS — Z79899 Other long term (current) drug therapy: Secondary | ICD-10-CM | POA: Diagnosis not present

## 2020-11-03 DIAGNOSIS — Z1321 Encounter for screening for nutritional disorder: Secondary | ICD-10-CM | POA: Diagnosis not present

## 2020-11-03 DIAGNOSIS — E039 Hypothyroidism, unspecified: Secondary | ICD-10-CM | POA: Diagnosis not present

## 2020-11-10 DIAGNOSIS — J301 Allergic rhinitis due to pollen: Secondary | ICD-10-CM | POA: Diagnosis not present

## 2020-12-07 ENCOUNTER — Other Ambulatory Visit (INDEPENDENT_AMBULATORY_CARE_PROVIDER_SITE_OTHER): Payer: Self-pay | Admitting: Nurse Practitioner

## 2020-12-07 DIAGNOSIS — I83813 Varicose veins of bilateral lower extremities with pain: Secondary | ICD-10-CM

## 2020-12-09 ENCOUNTER — Other Ambulatory Visit: Payer: Self-pay

## 2020-12-09 ENCOUNTER — Ambulatory Visit (INDEPENDENT_AMBULATORY_CARE_PROVIDER_SITE_OTHER): Payer: BC Managed Care – PPO | Admitting: Nurse Practitioner

## 2020-12-09 ENCOUNTER — Ambulatory Visit (INDEPENDENT_AMBULATORY_CARE_PROVIDER_SITE_OTHER): Payer: BC Managed Care – PPO

## 2020-12-09 ENCOUNTER — Encounter (INDEPENDENT_AMBULATORY_CARE_PROVIDER_SITE_OTHER): Payer: Self-pay | Admitting: Nurse Practitioner

## 2020-12-09 VITALS — BP 123/74 | HR 69 | Resp 16 | Ht 65.0 in | Wt 180.0 lb

## 2020-12-09 DIAGNOSIS — M79671 Pain in right foot: Secondary | ICD-10-CM

## 2020-12-09 DIAGNOSIS — I890 Lymphedema, not elsewhere classified: Secondary | ICD-10-CM

## 2020-12-09 DIAGNOSIS — I83813 Varicose veins of bilateral lower extremities with pain: Secondary | ICD-10-CM

## 2020-12-11 DIAGNOSIS — Z20822 Contact with and (suspected) exposure to covid-19: Secondary | ICD-10-CM | POA: Diagnosis not present

## 2020-12-15 ENCOUNTER — Encounter (INDEPENDENT_AMBULATORY_CARE_PROVIDER_SITE_OTHER): Payer: Self-pay | Admitting: Nurse Practitioner

## 2020-12-15 NOTE — Progress Notes (Signed)
Subjective:    Patient ID: Dana Hensley, female    DOB: 10-May-1961, 60 y.o.   MRN: [id number] Chief Complaint  Patient presents with  . New Patient (Initial Visit)    Ref Gauger varicose veins    Patient presents today with concern for lower extremity leg discomfort and varicosities.  The patient had endovenous procedures on her lower extremities about 5 years ago.  The patient notes that she began to have pain in her foot this summer at the bottom of her foot.  It becomes more painful when she walks.  She is also concerned about a nodule in her right lateral mid thigh area.  The patient denies any fever, chills, nausea, vomiting or diarrhea.  She continues to wear compression socks on a regular basis although she notes that sometimes certain socks hurt more depending on how it is structured.  Today noninvasive studies show no evidence of DVT or superficial thrombophlebitis bilaterally.  The palpable area appears to be a possible lipoma.  We will check some distal tibial arteries reveals triphasic waveforms.  Evidence of previous ablation of the bilateral great saphenous and small saphenous veins is evident.   Review of Systems  Cardiovascular: Positive for leg swelling.  All other systems reviewed and are negative.      Objective:   Physical Exam Vitals reviewed.  HENT:     Head: Normocephalic.  Cardiovascular:     Rate and Rhythm: Normal rate.     Pulses: Normal pulses.  Pulmonary:     Effort: Pulmonary effort is normal.  Musculoskeletal:     Right lower leg: Edema present.     Left lower leg: Edema present.  Skin:    General: Skin is warm and dry.     Capillary Refill: Capillary refill takes less than 2 seconds.  Neurological:     Mental Status: She is alert and oriented to person, place, and time.  Psychiatric:        Mood and Affect: Mood normal.        Behavior: Behavior normal.        Thought Content: Thought content normal.        Judgment: Judgment normal.      BP 123/74 (BP Location: Right Arm)   Pulse 69   Resp 16   Ht _0  (1.651 m)   Wt 180 lb (81.6 kg)   BMI 29.95 kg/m   Past Medical History:  Diagnosis Date  . Allergy   . Anxiety   . Asthma   . B12 deficiency   . Barrett esophagus   . Chronic atrophic gastritis   . DVT, lower extremity ([place])   . Gastric polyps   . HLD (hyperlipidemia)   . Hypothyroidism   . IGT (impaired glucose tolerance)   . Neuroendocrine tumor    carcinoid tumors of the stomach, s/p extraction surgery at [facility]  . PONV (postoperative nausea and vomiting)   . Tumor cells, malignant ([place])    Hx of carcinoid tumors of the stomach, s/p extraction surgery at [facility]  . Vitamin D deficiency     Social History   Socioeconomic History  . Marital status: Married    Spouse name: Not on file  . Number of children: Not on file  . Years of education: Not on file  . Highest education level: Not on file  Occupational History  . Not on file  Tobacco Use  . Smoking status: Never Smoker  . Smokeless tobacco: Never Used  Vaping Use  . Vaping Use: Never used  Substance and Sexual Activity  . Alcohol use: No    Alcohol/week: 0.0 standard drinks  . Drug use: No  . Sexual activity: Yes    Birth control/protection: None, Surgical    Comment: Hysterectomy  Other Topics Concern  . Not on file  Social History Narrative  . Not on file   Social Determinants of Health   Financial Resource Strain: Not on file  Food Insecurity: Not on file  Transportation Needs: Not on file  Physical Activity: Not on file  Stress: Not on file  Social Connections: Not on file  Intimate Partner Violence: Not on file    Past Surgical History:  Procedure Laterality Date  . ABDOMINAL HYSTERECTOMY    . APPENDECTOMY    . CHOLECYSTECTOMY N/A 11/16/2016   Procedure: LAPAROSCOPIC CHOLECYSTECTOMY WITH INTRAOPERATIVE CHOLANGIOGRAM;  Surgeon: Leonie Green, MD;  Location: [facility] ORS;  Service: General;  Laterality: N/A;  .  DIAGNOSTIC LAPAROSCOPY    . ESOPHAGOGASTRODUODENOSCOPY    . TONSILLECTOMY    . VEIN LIGATION AND STRIPPING      Family History  Problem Relation Age of Onset  . Clotting disorder Mother   . Hyperlipidemia Father   . Breast cancer Neg Hx     Allergies  Allergen Reactions  . Latex Swelling    SWELLING AROUND FACE SWELLING AROUND FACE  . Neomycin Other (See Comments) and Rash    Seen from allergy skin test Seen from allergy skin test  . Neosporin  [Neomycin-Bacitracin Zn-Polymyx] Other (See Comments)    Seen from allergy skin test  . Neomycin-Polymyxin-Gramicidin     Other reaction(s): Unknown Seen from allergy skin test  . Other Other (See Comments)    THIMERSOL. THIMERSOL- seen from allergy skin test Other reaction(s): Unknown THIMERSOL. THIMERSOL- seen from allergy skin test    CBC Latest Ref Rng & Units 04/16/2017 10/26/2015  WBC 3.6 - 11.0 K/uL 8.7 7.6  Hemoglobin 12.0 - 16.0 g/dL 11.8(L) 12.3  Hematocrit 35.0 - 47.0 % 35.8 37.1  Platelets 150 - 440 K/uL 202 261      CMP     Component Value Date/Time   NA 134 (L) 04/16/2017 1420   NA 142 10/26/2015 0947   K 3.4 (L) 04/16/2017 1420   CL 103 04/16/2017 1420   CO2 25 04/16/2017 1420   GLUCOSE 112 (H) 04/16/2017 1420   BUN 15 04/16/2017 1420   BUN 14 10/26/2015 0947   CREATININE 0.59 04/16/2017 1420   CALCIUM 8.6 (L) 04/16/2017 1420   PROT 7.1 09/08/2020 0946   ALBUMIN 4.1 09/08/2020 0946   AST 17 09/08/2020 0946   ALT 16 09/08/2020 0946   ALKPHOS 82 09/08/2020 0946   BILITOT <0.2 09/08/2020 0946   GFRNONAA >60 04/16/2017 1420   GFRAA >60 04/16/2017 1420     No results found.     Assessment & Plan:   1. Varicose veins of bilateral lower extremities with pain Based upon the patient description of pain it is not likely not related to her varicose veins.  Currently the patient has evidence of previous venous interventions however she currently has no reflux and therefore no role for further  intervention.  Patient is advised to continue with conservative therapy of wearing medical grade 1 compression elevation as well as exercise when possible.  Patient will follow-up on an as-needed basis. - VAS [place] LOWER EXTREMITY VENOUS REFLUX  2. Right foot pain The pain in the foot that  the patient is describing is not related to varicose veins is typically varicose veins do not cause plantar foot pain.  However it sound suspiciously like plantar fasciitis.  The patient has never seen a podiatrist.  Will place referral for further work-up. - Ambulatory referral to Podiatry  3. Lymphedema The patient continues to wear medical grade compression stockings on a regular basis.  She is advised to continue with wearing compression stockings in addition to elevation and exercise.  Patient will follow-up on an as-needed basis.   Current Outpatient Medications on File Prior to Visit  Medication Sig Dispense Refill  . Ascorbic Acid (VITAMIN C) 1000 MG tablet Take 1,000 mg by mouth daily.    . cetirizine (ZYRTEC) 10 MG tablet Take 10 mg by mouth daily.    . clobetasol ointment (TEMOVATE) 4.28 % Apply 1 application topically as needed.    . Cyanocobalamin (B-12 COMPLIANCE INJECTION IJ) Inject as directed every 30 (thirty) days.    . Cyanocobalamin 1000 MCG TBCR Take by mouth.    . dicyclomine (BENTYL) 10 MG capsule TAKE 1 CAPSULE (10 MG TOTAL) BY MOUTH 4 (FOUR) TIMES DAILY - BEFORE MEALS AND AT BEDTIME. 120 capsule 6  . EPINEPHrine 0.3 mg/0.3 mL IJ SOAJ injection Inject into the muscle.    . ferrous sulfate 325 (65 FE) MG tablet Take 325 mg by mouth daily with breakfast.    . levothyroxine (SYNTHROID, LEVOTHROID) 75 MCG tablet Take 1 tablet (75 mcg total) by mouth daily. 90 tablet 1  . mometasone (ELOCON) 0.1 % ointment Apply 1 application topically as needed.    . Multiple Vitamin (MULTI-VITAMINS) TABS Take 1 tablet by mouth daily.     . Multiple Vitamins-Minerals (ZINC PO) Take by mouth.    .  pimecrolimus (ELIDEL) 1 % cream Apply topically.    . benzonatate (TESSALON) 100 MG capsule Take 1 capsule (100 mg total) by mouth every 8 (eight) hours. 21 capsule 0  . busPIRone (BUSPAR) 10 MG tablet Take 10 mg by mouth 2 (two) times daily.  0  . esomeprazole (NEXIUM) 20 MG capsule Take by mouth.    . Tuberculin-Allergy Syringes (ALLERGIST TRAY 1CC 27GX3/8") 27G X 3/8" 1 ML KIT Allergist Tray Intradermal Bevel 1 mL 27 gauge x 3/8" syringe     No current facility-administered medications on file prior to visit.    There are no Patient Instructions on file for this visit. No follow-ups on file.   Kris Hartmann, NP

## 2020-12-29 ENCOUNTER — Ambulatory Visit: Payer: BC Managed Care – PPO | Admitting: Podiatry

## 2021-01-06 ENCOUNTER — Ambulatory Visit (INDEPENDENT_AMBULATORY_CARE_PROVIDER_SITE_OTHER): Payer: BC Managed Care – PPO

## 2021-01-06 ENCOUNTER — Other Ambulatory Visit: Payer: Self-pay

## 2021-01-06 ENCOUNTER — Encounter: Payer: Self-pay | Admitting: Podiatry

## 2021-01-06 ENCOUNTER — Ambulatory Visit (INDEPENDENT_AMBULATORY_CARE_PROVIDER_SITE_OTHER): Payer: BC Managed Care – PPO | Admitting: Podiatry

## 2021-01-06 DIAGNOSIS — M722 Plantar fascial fibromatosis: Secondary | ICD-10-CM

## 2021-01-07 ENCOUNTER — Encounter: Payer: Self-pay | Admitting: Podiatry

## 2021-01-07 NOTE — Progress Notes (Signed)
Subjective:  Patient ID: Dana Hensley, female    DOB: 1961-07-15,  MRN: [id number]  Chief Complaint  Patient presents with  . Foot Pain    Patient presents today for bilat heel and arch pain, right worse than left    60 y.o. female presents with the above complaint.  Patient presents with complaint bilateral heel and arch pain.  Patient states been going on for quite some time.  Patient states is very mild in nature.  She does not want any injections or any very involved in treatment options.  She would like to discuss all the options that are available for her.  She denies any other acute complaints she has not seen anyone else prior to see me.  Hurts with first thing in the morning.  Is dull achy in nature.   Review of Systems: Negative except as noted in the HPI. Denies N/V/F/Ch.  Past Medical History:  Diagnosis Date  . Allergy   . Anxiety   . Asthma   . B12 deficiency   . Barrett esophagus   . Chronic atrophic gastritis   . DVT, lower extremity ([place])   . Gastric polyps   . HLD (hyperlipidemia)   . Hypothyroidism   . IGT (impaired glucose tolerance)   . Neuroendocrine tumor    carcinoid tumors of the stomach, s/p extraction surgery at [facility]  . PONV (postoperative nausea and vomiting)   . Tumor cells, malignant ([place])    Hx of carcinoid tumors of the stomach, s/p extraction surgery at [facility]  . Vitamin D deficiency     Current Outpatient Medications:  .  Ascorbic Acid (VITAMIN C) 1000 MG tablet, Take 1,000 mg by mouth daily., Disp: , Rfl:  .  cetirizine (ZYRTEC) 10 MG tablet, Take 10 mg by mouth daily., Disp: , Rfl:  .  clobetasol ointment (TEMOVATE) 1.27 %, Apply 1 application topically as needed., Disp: , Rfl:  .  Cyanocobalamin (B-12 COMPLIANCE INJECTION IJ), Inject as directed every 30 (thirty) days., Disp: , Rfl:  .  Cyanocobalamin 1000 MCG TBCR, Take by mouth., Disp: , Rfl:  .  dicyclomine (BENTYL) 10 MG capsule, TAKE 1 CAPSULE (10 MG TOTAL) BY MOUTH 4 (FOUR) TIMES  DAILY - BEFORE MEALS AND AT BEDTIME., Disp: 120 capsule, Rfl: 6 .  EPINEPHrine 0.3 mg/0.3 mL IJ SOAJ injection, Inject into the muscle., Disp: , Rfl:  .  ferrous sulfate 325 (65 FE) MG tablet, Take 325 mg by mouth daily with breakfast., Disp: , Rfl:  .  levothyroxine (SYNTHROID, LEVOTHROID) 75 MCG tablet, Take 1 tablet (75 mcg total) by mouth daily., Disp: 90 tablet, Rfl: 1 .  mometasone (ELOCON) 0.1 % ointment, Apply 1 application topically as needed., Disp: , Rfl:  .  Multiple Vitamin (MULTI-VITAMINS) TABS, Take 1 tablet by mouth daily. , Disp: , Rfl:  .  Multiple Vitamins-Minerals (ZINC PO), Take by mouth., Disp: , Rfl:  .  pimecrolimus (ELIDEL) 1 % cream, Apply topically., Disp: , Rfl:  .  Tuberculin-Allergy Syringes (ALLERGIST TRAY 1CC 27GX3/8") 27G X 3/8" 1 ML KIT, Allergist Tray Intradermal Bevel 1 mL 27 gauge x 3/8" syringe, Disp: , Rfl:   Social History   Tobacco Use  Smoking Status Never Smoker  Smokeless Tobacco Never Used    Allergies  Allergen Reactions  . Latex Swelling    SWELLING AROUND FACE SWELLING AROUND FACE  . Neomycin Other (See Comments) and Rash    Seen from allergy skin test Seen from allergy skin test  .  Neosporin  [Neomycin-Bacitracin Zn-Polymyx] Other (See Comments)    Seen from allergy skin test  . Neomycin-Polymyxin-Gramicidin     Other reaction(s): Unknown Seen from allergy skin test  . Other Other (See Comments)    THIMERSOL. THIMERSOL- seen from allergy skin test Other reaction(s): Unknown THIMERSOL. THIMERSOL- seen from allergy skin test   Objective:  There were no vitals filed for this visit. There is no height or weight on file to calculate BMI. Constitutional Well developed. Well nourished.  Vascular Dorsalis pedis pulses palpable bilaterally. Posterior tibial pulses palpable bilaterally. Capillary refill normal to all digits.  No cyanosis or clubbing noted. Pedal hair growth normal.  Neurologic Normal speech. Oriented to person,  place, and time. Epicritic sensation to light touch grossly present bilaterally.  Dermatologic Nails well groomed and normal in appearance. No open wounds. No skin lesions.  Orthopedic: Normal joint ROM without pain or crepitus bilaterally. No visible deformities. Tender to palpation at the calcaneal tuber bilaterally. No pain with calcaneal squeeze bilaterally. Ankle ROM diminished range of motion bilaterally. Silfverskiold Test: positive bilaterally.   Radiographs: Taken and reviewed. No acute fractures or dislocations. No evidence of stress fracture.  Plantar heel spur absent. Posterior heel spur present.   Assessment:   1. Plantar fasciitis of right foot   2. Plantar fasciitis of left foot    Plan:  Patient was evaluated and treated and all questions answered.  Plantar Fasciitis, bilaterally - Clinically her pain is not bad right now.  As her pain is very mild in nature I believe patient can just benefit from Voltaren gel.  She does not want to do any kind of bracing she does not want to do any injections.  She just wanted information on what is going on in her foot.  I discussed with her the shoe gear modification as well as the nonconservative treatment options.  She states understanding. -I will see her back as needed No follow-ups on file.

## 2021-02-02 DIAGNOSIS — M7918 Myalgia, other site: Secondary | ICD-10-CM | POA: Diagnosis not present

## 2021-02-02 DIAGNOSIS — M4605 Spinal enthesopathy, thoracolumbar region: Secondary | ICD-10-CM | POA: Diagnosis not present

## 2021-02-02 DIAGNOSIS — M9902 Segmental and somatic dysfunction of thoracic region: Secondary | ICD-10-CM | POA: Diagnosis not present

## 2021-03-17 DIAGNOSIS — E559 Vitamin D deficiency, unspecified: Secondary | ICD-10-CM | POA: Diagnosis not present

## 2021-03-17 DIAGNOSIS — D3A092 Benign carcinoid tumor of the stomach: Secondary | ICD-10-CM | POA: Diagnosis not present

## 2021-03-17 DIAGNOSIS — D3A8 Other benign neuroendocrine tumors: Secondary | ICD-10-CM | POA: Diagnosis not present

## 2021-03-17 DIAGNOSIS — K2950 Unspecified chronic gastritis without bleeding: Secondary | ICD-10-CM | POA: Diagnosis not present

## 2021-03-17 DIAGNOSIS — E785 Hyperlipidemia, unspecified: Secondary | ICD-10-CM | POA: Diagnosis not present

## 2021-03-17 DIAGNOSIS — K219 Gastro-esophageal reflux disease without esophagitis: Secondary | ICD-10-CM | POA: Diagnosis not present

## 2021-03-17 DIAGNOSIS — K2940 Chronic atrophic gastritis without bleeding: Secondary | ICD-10-CM | POA: Diagnosis not present

## 2021-03-17 DIAGNOSIS — K31A19 Gastric intestinal metaplasia without dysplasia, unspecified site: Secondary | ICD-10-CM | POA: Diagnosis not present

## 2021-03-17 DIAGNOSIS — K2270 Barrett's esophagus without dysplasia: Secondary | ICD-10-CM | POA: Diagnosis not present

## 2021-03-17 DIAGNOSIS — E039 Hypothyroidism, unspecified: Secondary | ICD-10-CM | POA: Diagnosis not present

## 2021-03-17 DIAGNOSIS — Z7951 Long term (current) use of inhaled steroids: Secondary | ICD-10-CM | POA: Diagnosis not present

## 2021-03-17 DIAGNOSIS — E538 Deficiency of other specified B group vitamins: Secondary | ICD-10-CM | POA: Diagnosis not present

## 2021-03-17 DIAGNOSIS — K317 Polyp of stomach and duodenum: Secondary | ICD-10-CM | POA: Diagnosis not present

## 2021-03-22 DIAGNOSIS — M4605 Spinal enthesopathy, thoracolumbar region: Secondary | ICD-10-CM | POA: Diagnosis not present

## 2021-03-22 DIAGNOSIS — M7918 Myalgia, other site: Secondary | ICD-10-CM | POA: Diagnosis not present

## 2021-03-22 DIAGNOSIS — M9902 Segmental and somatic dysfunction of thoracic region: Secondary | ICD-10-CM | POA: Diagnosis not present

## 2021-03-29 ENCOUNTER — Encounter: Payer: Self-pay | Admitting: Obstetrics and Gynecology

## 2021-03-29 ENCOUNTER — Ambulatory Visit (INDEPENDENT_AMBULATORY_CARE_PROVIDER_SITE_OTHER): Payer: BC Managed Care – PPO | Admitting: Obstetrics and Gynecology

## 2021-03-29 ENCOUNTER — Other Ambulatory Visit: Payer: Self-pay

## 2021-03-29 VITALS — BP 120/80 | Ht 65.0 in | Wt 178.0 lb

## 2021-03-29 DIAGNOSIS — N952 Postmenopausal atrophic vaginitis: Secondary | ICD-10-CM | POA: Diagnosis not present

## 2021-03-29 MED ORDER — PREMARIN 0.625 MG/GM VA CREA: 1.0000 | TOPICAL_CREAM | Freq: Every day | VAGINAL | 12 refills | Status: DC

## 2021-03-29 NOTE — Patient Instructions (Addendum)
Vaginal/ Vulvar Moisturizer Use 3-5 times a week at bedtime Hyalo Gyn Revaree Replens Isaiah Blakes Key-E suppositories Vitamin E oil, olive oil, coconut oil   General vulvovaginal hygiene measures Keep vulva clean, dry, and well aerated  0 Avoid sleeper pajamas. Nightgowns allow air to circulate.  0 Cotton underpants. Double-rinse underwear after washing to avoid residual irritants. Do not use fabric softeners for underwear and swimsuits.  0 Avoid tights, leotards, and leggings. Skirts and loose-fitting pants allow air to circulate.  0 Avoid sitting in wet swimsuits for long periods of time.  Daily warm bathing   0 Do not use bubble baths or perfumed soaps.  0 soak in clean water (no soap) for 10 to 15 minutes.  0 Use soap to wash regions other than the genital area just before getting out of the tub. Limit use of any soap on genital areas.  0 Rinse the genital area well and gently pat dry.  0 A hair dryer on the cool setting may be helpful to assist with drying the genital region.  0 If the vulvar area is tender or swollen, cool compresses may relieve the discomfort. Emollients may help protect skin.  Review toilet hygiene    1 Sit with knees apart to reduce reflux of urine into the vagina.    0 Emphasize wiping front to back after bowel movements.  0 Wet wipes can be used instead of toilet paper for wiping as long as they don't cause a "stinging" sensation.     Atrophic Vaginitis  Atrophic vaginitis is a condition in which the tissues that line the vagina become dry and thin. This condition is most common in women who have stopped having regular menstrual periods (are in menopause). This usually starts when a woman is 60 to 59 years old. That is the time when a woman's estrogen levels begin to decrease. Estrogen is a female hormone. It helps to keep the tissues of the vagina moist. It stimulates the vagina to produce a clear fluid that lubricates the vagina for sex. This fluid also  protects the vagina from infection. Lack of estrogen can cause the lining of the vagina to get thinner and dryer. The vagina may also shrink in size. It may become less elastic. Atrophic vaginitis tends to get worse over time as a woman's estrogen level drops. What are the causes? This condition is caused by the normal drop in estrogen that happens around the time of menopause. What increases the risk? Certain conditions or situations may lower a woman's estrogen level, leading to a higher risk for atrophic vaginitis. You are more likely to develop this condition if:  You are taking medicines that block estrogen.  You have had your ovaries removed.  You are being treated for cancer with radiation or medicines (chemotherapy).  You have given birth or are breastfeeding.  You are older than age 55.  You smoke. What are the signs or symptoms? Symptoms of this condition include:  Pain, soreness, a feeling of pressure, or bleeding during sex (dyspareunia).  Vaginal burning, irritation, or itching.  Pain or bleeding when a speculum is used in a vaginal exam.  Having burning pain while urinating.  Vaginal discharge. In some cases, there are no symptoms. How is this diagnosed? This condition is diagnosed based on your medical history and a physical exam. This will include a pelvic exam that checks the vaginal tissues. Though rare, you may also have other tests, including:  A urine test.  A test that  checks the acid balance in your vagina (acid balance test). How is this treated? Treatment for this condition depends on how severe your symptoms are. Treatment may include:  Using an over-the-counter vaginal lubricant before sex.  Using a long-acting vaginal moisturizer.  Using low-dose estrogen for moderate to severe symptoms that do not respond to other treatments. Options include creams, tablets, and inserts (vaginal rings). Before you use a vaginal estrogen, tell your health care  provider if you have a history of: ? Breast cancer. ? Endometrial cancer. ? Blood clots. If you are not sexually active and your symptoms are very mild, you may not need treatment. Follow these instructions at home: Medicines  Take over-the-counter and prescription medicines only as told by your health care provider.  Do not use herbal or alternative medicines unless your health care provider says that you can.  Use over-the-counter creams, lubricants, or moisturizers for dryness only as told by your health care provider. General instructions  If your atrophic vaginitis is caused by menopause, discuss all of your menopause symptoms and treatment options with your health care provider.  Do not douche.  Do not use products that can make your vagina dry. These include: ? Scented feminine sprays. ? Scented tampons. ? Scented soaps.  Vaginal sex can help to improve blood flow and elasticity of vaginal tissue. If you choose to have sex and it hurts, try using a water-soluble lubricant or moisturizer right before having sex. Contact a health care provider if:  Your discharge looks different than normal.  Your vagina has an unusual smell.  You have new symptoms.  Your symptoms do not improve with treatment.  Your symptoms get worse. Summary  Atrophic vaginitis is a condition in which the tissues that line the vagina become dry and thin. It is most common in women who have stopped having regular menstrual periods (are in menopause).  Treatment options include using vaginal lubricants and low-dose vaginal estrogen.  Contact a health care provider if your vagina has an unusual smell, or if your symptoms get worse or do not improve after treatment. This information is not intended to replace advice given to you by your health care provider. Make sure you discuss any questions you have with your health care provider. Document Revised: 05/13/2020 Document Reviewed: 05/13/2020 Elsevier  Patient Education  [place].

## 2021-03-29 NOTE — Progress Notes (Signed)
Patient ID: Dana Hensley, female   DOB: Oct 02, 1961, 60 y.o.   MRN: [id number]  Reason for Consult: Pelvic Pain (3 weeks /)   Referred by Sallee Lange, *  Subjective:     HPI:  Dana Hensley is a 60 y.o. female. She reportthat 3 years ago she had pain in her tailbone and pain in the back of her legs. She went to pelvic physical therapy and this helped significantly. She had 3 years without pain. Last year she was doing a lot of yard work and she noted some rectal bleeding she was told it was hemorrhoids and started a hemorrhoid ointment. Recently she has been having pain when moving soil or when making a chicken coop. She reports discomfort and a stretching or pulling pain between her vagina and anus. She reports burning and itching of the vulva and vagina. She used an ointment in the past which she believes helped her.   Gynecological History  No LMP recorded. Patient has had a hysterectomy. Menarche: [age] Menopause: 9  History of fibroids, polyps, or ovarian cysts? : yes  History of PCOS? no Hstory of Endometriosis? yes History of abnormal pap smears? no Have you had any sexually transmitted infections in the past? no  She identifies as a female. She is not sexually active.   Obstetrical History 08/12/1982- vaginal delivery, live birth, [redacted] weeks gestation, female  Past Medical History:  Diagnosis Date  . Allergy   . Anxiety   . Asthma   . B12 deficiency   . Barrett esophagus   . Chronic atrophic gastritis   . DVT, lower extremity ([place])   . Gastric polyps   . HLD (hyperlipidemia)   . Hypothyroidism   . IGT (impaired glucose tolerance)   . Neuroendocrine tumor    carcinoid tumors of the stomach, s/p extraction surgery at [facility]  . PONV (postoperative nausea and vomiting)   . Tumor cells, malignant ([place])    Hx of carcinoid tumors of the stomach, s/p extraction surgery at [facility]  . Vitamin D deficiency    Family History  Problem Relation Age of Onset  .  Clotting disorder Mother   . Hyperlipidemia Father   . Breast cancer Neg Hx    Past Surgical History:  Procedure Laterality Date  . ABDOMINAL HYSTERECTOMY    . APPENDECTOMY    . CHOLECYSTECTOMY N/A 11/16/2016   Procedure: LAPAROSCOPIC CHOLECYSTECTOMY WITH INTRAOPERATIVE CHOLANGIOGRAM;  Surgeon: Leonie Green, MD;  Location: [facility] ORS;  Service: General;  Laterality: N/A;  . DIAGNOSTIC LAPAROSCOPY    . ESOPHAGOGASTRODUODENOSCOPY    . TONSILLECTOMY    . VEIN LIGATION AND STRIPPING      Short Social History:  Social History   Tobacco Use  . Smoking status: Never Smoker  . Smokeless tobacco: Never Used  Substance Use Topics  . Alcohol use: No    Alcohol/week: 0.0 standard drinks    Allergies  Allergen Reactions  . Latex Swelling    SWELLING AROUND FACE SWELLING AROUND FACE  . Neomycin Other (See Comments) and Rash    Seen from allergy skin test Seen from allergy skin test  . Neosporin  [Neomycin-Bacitracin Zn-Polymyx] Other (See Comments)    Seen from allergy skin test  . Neomycin-Polymyxin-Gramicidin     Other reaction(s): Unknown Seen from allergy skin test  . Other Other (See Comments)    THIMERSOL. THIMERSOL- seen from allergy skin test Other reaction(s): Unknown THIMERSOL. THIMERSOL- seen from allergy skin test  Current Outpatient Medications  Medication Sig Dispense Refill  . Ascorbic Acid (VITAMIN C) 1000 MG tablet Take 1,000 mg by mouth daily.    . cetirizine (ZYRTEC) 10 MG tablet Take 10 mg by mouth daily.    . clobetasol ointment (TEMOVATE) 3.36 % Apply 1 application topically as needed.    . Cyanocobalamin (B-12 COMPLIANCE INJECTION IJ) Inject as directed every 30 (thirty) days.    . Cyanocobalamin 1000 MCG TBCR Take by mouth.    . dicyclomine (BENTYL) 10 MG capsule TAKE 1 CAPSULE (10 MG TOTAL) BY MOUTH 4 (FOUR) TIMES DAILY - BEFORE MEALS AND AT BEDTIME. 120 capsule 6  . EPINEPHrine 0.3 mg/0.3 mL IJ SOAJ injection Inject into the muscle.    .  ferrous sulfate 325 (65 FE) MG tablet Take 325 mg by mouth daily with breakfast.    . levothyroxine (SYNTHROID, LEVOTHROID) 75 MCG tablet Take 1 tablet (75 mcg total) by mouth daily. 90 tablet 1  . mometasone (ELOCON) 0.1 % ointment Apply 1 application topically as needed.    . Multiple Vitamin (MULTI-VITAMINS) TABS Take 1 tablet by mouth daily.     . Multiple Vitamins-Minerals (ZINC PO) Take by mouth.    . pimecrolimus (ELIDEL) 1 % cream Apply topically.    . Tuberculin-Allergy Syringes 27G X 3/8" 1 ML KIT Allergist Tray Intradermal Bevel 1 mL 27 gauge x 3/8" syringe     No current facility-administered medications for this visit.    Review of Systems  Constitutional: Negative for chills, fatigue, fever and unexpected weight change.  HENT: Negative for trouble swallowing.  Eyes: Negative for loss of vision.  Respiratory: Negative for cough, shortness of breath and wheezing.  Cardiovascular: Negative for chest pain, leg swelling, palpitations and syncope.  GI: Negative for abdominal pain, blood in stool, diarrhea, nausea and vomiting.  GU: Negative for difficulty urinating, dysuria, frequency and hematuria.  Musculoskeletal: Negative for back pain, leg pain and joint pain.  Skin: Negative for rash.  Neurological: Negative for dizziness, headaches, light-headedness, numbness and seizures.  Psychiatric: Negative for behavioral problem, confusion, depressed mood and sleep disturbance.        Objective:  Objective   Vitals:   03/29/21 1557  BP: 120/80  Weight: 178 lb (80.7 kg)  Height: _0  (1.651 m)   Body mass index is 29.62 kg/m.  Physical Exam Vitals and nursing note reviewed. Exam conducted with a chaperone present.  Constitutional:      Appearance: Normal appearance. She is well-developed.  HENT:     Head: Normocephalic and atraumatic.  Eyes:     Extraocular Movements: Extraocular movements intact.     Pupils: Pupils are equal, round, and reactive to light.   Cardiovascular:     Rate and Rhythm: Normal rate and regular rhythm.  Pulmonary:     Effort: Pulmonary effort is normal. No respiratory distress.     Breath sounds: Normal breath sounds.  Abdominal:     General: Abdomen is flat.     Palpations: Abdomen is soft.  Genitourinary:    Comments: External: Atrophic appearing vulva. No lesions noted.  Speculum examination: Normal appearing vaginal cuff Bimanual examination: Uterus absent. No adnexal masses. No adnexal tenderness. Pelvis not fixed.  Musculoskeletal:        General: No signs of injury.  Skin:    General: Skin is warm and dry.  Neurological:     Mental Status: She is alert and oriented to person, place, and time.  Psychiatric:  Behavior: Behavior normal.        Thought Content: Thought content normal.        Judgment: Judgment normal.     Assessment/Plan:     60 yo with atrophic vaginitis Will start premarin. Recommended nightly application for 4 weeks then decrease to every other day for 4 weeks then once a week long term for maintenance of vaginal tissues.  No contraindications for estrogen usage.   Discussed OTC options for vulvar and vaginal moisture.  Discussed soak and seal methods for care of vaginal tissues.   More than 30 minutes were spent face to face with the patient in the room, reviewing the medical record, labs and images, and coordinating care for the patient. The plan of management was discussed in detail and counseling was provided.    Adrian Prows MD [facility], [facility] 03/29/2021 4:12 PM

## 2021-04-04 ENCOUNTER — Other Ambulatory Visit: Payer: Self-pay

## 2021-04-04 ENCOUNTER — Encounter: Payer: Self-pay | Admitting: Gastroenterology

## 2021-04-04 ENCOUNTER — Ambulatory Visit (INDEPENDENT_AMBULATORY_CARE_PROVIDER_SITE_OTHER): Payer: BC Managed Care – PPO | Admitting: Gastroenterology

## 2021-04-04 VITALS — BP 129/76 | HR 70 | Temp 98.4°F | Ht 65.0 in | Wt 177.2 lb

## 2021-04-04 DIAGNOSIS — L290 Pruritus ani: Secondary | ICD-10-CM

## 2021-04-04 DIAGNOSIS — Z86010 Personal history of colonic polyps: Secondary | ICD-10-CM

## 2021-04-04 MED ORDER — PEG 3350-KCL-NA BICARB-NACL 420 G PO SOLR: ORAL | 0 refills | Status: DC

## 2021-04-04 NOTE — Progress Notes (Signed)
Primary Care Physician: Sallee Lange, NP  Primary Gastroenterologist:  Dr. [name]  Chief Complaint  Patient presents with  . Follow-up    HPI: Dana Hensley is a 60 y.o. female here for follow-up after having an upper endoscopy with EUS by Dr. Francella Solian in April of this year.  The tissue removed showed:  A. Gastric polyps, endoscopic mucosal resection: Gastric neuroendocrine tumor (type 1), measuring 4 mm in greatest dimension, confined to the mucosa, arising in background atrophic gastritis with chronic inactive gastritis, intestinal metaplasia (predominantly complete) and nodular neuroendocrine cell hyperplasia. The margin is negative for tumor. Separate fragment of gastric hyperplastic polyp.   The patient now comes with a report of a history of polyps and anal pruritus.  She also states that she has perianal pain and was given some moisturizing cream by her gynecologist.  The patient has had pelvic muscle issues in the past and has gone through physical therapy.  She is currently concerned that there may be something in her rectum metastases causing her present issues that she had a history of polyps.  Past Medical History:  Diagnosis Date  . Allergy   . Anxiety   . Asthma   . B12 deficiency   . Barrett esophagus   . Chronic atrophic gastritis   . DVT, lower extremity ([place])   . Gastric polyps   . HLD (hyperlipidemia)   . Hypothyroidism   . IGT (impaired glucose tolerance)   . Neuroendocrine tumor    carcinoid tumors of the stomach, s/p extraction surgery at [facility]  . PONV (postoperative nausea and vomiting)   . Tumor cells, malignant ([place])    Hx of carcinoid tumors of the stomach, s/p extraction surgery at [facility]  . Vitamin D deficiency     Current Outpatient Medications  Medication Sig Dispense Refill  . Ascorbic Acid (VITAMIN C) 1000 MG tablet Take 1,000 mg by mouth daily.    . cetirizine (ZYRTEC) 10 MG tablet Take 10 mg by mouth daily.    . clobetasol  ointment (TEMOVATE) 8.33 % Apply 1 application topically as needed.    . conjugated estrogens (PREMARIN) vaginal cream Place 1 Applicatorful vaginally daily. 42.5 g 12  . Cyanocobalamin (B-12 COMPLIANCE INJECTION IJ) Inject as directed every 30 (thirty) days.    . Cyanocobalamin 1000 MCG TBCR Take by mouth.    . dicyclomine (BENTYL) 10 MG capsule TAKE 1 CAPSULE (10 MG TOTAL) BY MOUTH 4 (FOUR) TIMES DAILY - BEFORE MEALS AND AT BEDTIME. 120 capsule 6  . EPINEPHrine 0.3 mg/0.3 mL IJ SOAJ injection Inject into the muscle.    . ferrous sulfate 325 (65 FE) MG tablet Take 325 mg by mouth daily with breakfast.    . levothyroxine (SYNTHROID, LEVOTHROID) 75 MCG tablet Take 1 tablet (75 mcg total) by mouth daily. 90 tablet 1  . mometasone (ELOCON) 0.1 % ointment Apply 1 application topically as needed.    . Multiple Vitamin (MULTI-VITAMINS) TABS Take 1 tablet by mouth daily.     . Multiple Vitamins-Minerals (ZINC PO) Take by mouth.    . pimecrolimus (ELIDEL) 1 % cream Apply topically.    Marland Kitchen augmented betamethasone dipropionate (DIPROLENE-AF) 0.05 % ointment Apply topically 2 (two) times daily as needed.    . cyanocobalamin (,VITAMIN B-12,) 1000 MCG/ML injection Inject 1,000 mcg into the muscle every 30 (thirty) days.    . polyethylene glycol-electrolytes (GAVILYTE-N WITH FLAVOR PACK) 420 g solution Drink one 8 oz glass every 20 mins until entire  container is finished starting at 5:00pm on 04/20/21 4000 mL 0   No current facility-administered medications for this visit.    Allergies as of 04/04/2021 - Review Complete 04/04/2021  Allergen Reaction Noted  . Latex Swelling 07/09/2015  . Neomycin Other (See Comments) and Rash 07/09/2015  . Neosporin  [neomycin-bacitracin zn-polymyx] Other (See Comments) 07/09/2015  . Neomycin-polymyxin-gramicidin  07/09/2015  . Other Other (See Comments) 07/09/2015    ROS:  General: Negative for anorexia, weight loss, fever, chills, fatigue, weakness. ENT: Negative for  hoarseness, difficulty swallowing , nasal congestion. CV: Negative for chest pain, angina, palpitations, dyspnea on exertion, peripheral edema.  Respiratory: Negative for dyspnea at rest, dyspnea on exertion, cough, sputum, wheezing.  GI: See history of present illness. GU:  Negative for dysuria, hematuria, urinary incontinence, urinary frequency, nocturnal urination.  Endo: Negative for unusual weight change.    Physical Examination:   BP 129/76   Pulse 70   Temp 98.4 F (36.9 C) (Temporal)   Ht 5\' 5"  (1.651 m)   Wt 177 lb 3.2 oz (80.4 kg)   BMI 29.49 kg/m   General: Well-nourished, well-developed in no acute distress.  Eyes: No icterus. Conjunctivae pink. Neuro: Alert and oriented x 3.  Grossly intact. Skin: Warm and dry, no jaundice.   Psych: Alert and cooperative, normal mood and affect.  Labs:    Imaging Studies: No results found.  Assessment and Plan:   Dana Hensley is a 60 y.o. y/o female who comes in today with a history of colon polyps and rectal itching with rectal discomfort and pelvic floor pain with muscle around the anus and pelvis that she reports to be bothering her. The patient has a follow-up for repeat EGD at [facility] for her carcinoid tumor.  The patient will be set up for colonoscopy due to her history of polyps.  The patient will be using baby wipes after she defecates and has been told to get the unscented out all free wipes.  She will also then use toilet paper to dry the area and keep it clean. The patient has been explained the plan and agrees with it.     [name], MD. Marval Regal    Note: This dictation was prepared with Dragon dictation along with smaller phrase technology. Any transcriptional errors that result from this process are unintentional.

## 2021-04-12 DIAGNOSIS — M9904 Segmental and somatic dysfunction of sacral region: Secondary | ICD-10-CM | POA: Diagnosis not present

## 2021-04-12 DIAGNOSIS — M5127 Other intervertebral disc displacement, lumbosacral region: Secondary | ICD-10-CM | POA: Diagnosis not present

## 2021-04-12 DIAGNOSIS — M62830 Muscle spasm of back: Secondary | ICD-10-CM | POA: Diagnosis not present

## 2021-04-12 DIAGNOSIS — M9903 Segmental and somatic dysfunction of lumbar region: Secondary | ICD-10-CM | POA: Diagnosis not present

## 2021-04-19 DIAGNOSIS — M9903 Segmental and somatic dysfunction of lumbar region: Secondary | ICD-10-CM | POA: Diagnosis not present

## 2021-04-19 DIAGNOSIS — M9904 Segmental and somatic dysfunction of sacral region: Secondary | ICD-10-CM | POA: Diagnosis not present

## 2021-04-19 DIAGNOSIS — M62830 Muscle spasm of back: Secondary | ICD-10-CM | POA: Diagnosis not present

## 2021-04-19 DIAGNOSIS — M5127 Other intervertebral disc displacement, lumbosacral region: Secondary | ICD-10-CM | POA: Diagnosis not present

## 2021-04-20 ENCOUNTER — Encounter: Payer: Self-pay | Admitting: Gastroenterology

## 2021-04-21 ENCOUNTER — Encounter: Payer: Self-pay | Admitting: Gastroenterology

## 2021-04-21 ENCOUNTER — Ambulatory Visit: Payer: BC Managed Care – PPO | Admitting: Anesthesiology

## 2021-04-21 ENCOUNTER — Encounter
Admission: RE | Disposition: A | Discharge status: Home / Self Care | Payer: Self-pay | Source: Home / Self Care | Attending: Gastroenterology

## 2021-04-21 ENCOUNTER — Ambulatory Visit
Admission: RE | Admit: 2021-04-21 | Discharge: 2021-04-21 | Disposition: A | Discharge status: Home / Self Care | Payer: BC Managed Care – PPO | Attending: Gastroenterology | Admitting: Gastroenterology

## 2021-04-21 DIAGNOSIS — Z85020 Personal history of malignant carcinoid tumor of stomach: Secondary | ICD-10-CM | POA: Insufficient documentation

## 2021-04-21 DIAGNOSIS — Z79899 Other long term (current) drug therapy: Secondary | ICD-10-CM | POA: Diagnosis not present

## 2021-04-21 DIAGNOSIS — Z79890 Hormone replacement therapy (postmenopausal): Secondary | ICD-10-CM | POA: Diagnosis not present

## 2021-04-21 DIAGNOSIS — Z86010 Personal history of colonic polyps: Secondary | ICD-10-CM | POA: Insufficient documentation

## 2021-04-21 DIAGNOSIS — K641 Second degree hemorrhoids: Secondary | ICD-10-CM | POA: Diagnosis not present

## 2021-04-21 DIAGNOSIS — Z91040 Latex allergy status: Secondary | ICD-10-CM | POA: Diagnosis not present

## 2021-04-21 DIAGNOSIS — E039 Hypothyroidism, unspecified: Secondary | ICD-10-CM | POA: Diagnosis not present

## 2021-04-21 DIAGNOSIS — Z1211 Encounter for screening for malignant neoplasm of colon: Secondary | ICD-10-CM | POA: Insufficient documentation

## 2021-04-21 DIAGNOSIS — Z881 Allergy status to other antibiotic agents status: Secondary | ICD-10-CM | POA: Diagnosis not present

## 2021-04-21 HISTORY — PX: COLONOSCOPY WITH PROPOFOL: SHX5780

## 2021-04-21 SURGERY — COLONOSCOPY WITH PROPOFOL
Anesthesia: General

## 2021-04-21 MED ORDER — SODIUM CHLORIDE 0.9 % IV SOLN: INTRAVENOUS | Status: DC

## 2021-04-21 MED ORDER — PROPOFOL 500 MG/50ML IV EMUL
INTRAVENOUS | Status: DC | PRN
  Administered 2021-04-21: 140 ug/kg/min via INTRAVENOUS

## 2021-04-21 MED ORDER — PROPOFOL 10 MG/ML IV BOLUS
INTRAVENOUS | Status: DC | PRN
  Administered 2021-04-21: 70 mg via INTRAVENOUS

## 2021-04-21 NOTE — H&P (Signed)
[name], MD [facility] [address] [place], [place] [address] Phone:[phone] Fax : [phone]  Primary Care Physician:  Sallee Lange, NP Primary Gastroenterologist:  Dr. Allen Norris  Pre-Procedure History & Physical: HPI:  Dana Hensley is a 60 y.o. female is here for an colonoscopy.   Past Medical History:  Diagnosis Date  . Allergy   . Anxiety   . Asthma   . B12 deficiency   . Barrett esophagus   . Chronic atrophic gastritis   . DVT, lower extremity ([place])   . Gastric polyps   . HLD (hyperlipidemia)   . Hypothyroidism   . IGT (impaired glucose tolerance)   . Neuroendocrine tumor    carcinoid tumors of the stomach, s/p extraction surgery at [facility]  . PONV (postoperative nausea and vomiting)   . Tumor cells, malignant ([place])    Hx of carcinoid tumors of the stomach, s/p extraction surgery at [facility]  . Vitamin D deficiency     Past Surgical History:  Procedure Laterality Date  . ABDOMINAL HYSTERECTOMY    . APPENDECTOMY    . CHOLECYSTECTOMY N/A 11/16/2016   Procedure: LAPAROSCOPIC CHOLECYSTECTOMY WITH INTRAOPERATIVE CHOLANGIOGRAM;  Surgeon: Leonie Green, MD;  Location: [facility] ORS;  Service: General;  Laterality: N/A;  . DIAGNOSTIC LAPAROSCOPY    . ESOPHAGOGASTRODUODENOSCOPY    . TONSILLECTOMY    . VEIN LIGATION AND STRIPPING      Prior to Admission medications   Medication Sig Start Date End Date Taking? Authorizing Provider  ferrous sulfate 325 (65 FE) MG tablet Take 325 mg by mouth daily with breakfast.   Yes [provider]  levothyroxine (SYNTHROID, LEVOTHROID) 75 MCG tablet Take 1 tablet (75 mcg total) by mouth daily. 03/15/16  Yes Lada, Satira Anis, MD  Ascorbic Acid (VITAMIN C) 1000 MG tablet Take 1,000 mg by mouth daily.    [provider]  augmented betamethasone dipropionate (DIPROLENE-AF) 0.05 % ointment Apply topically 2 (two) times daily as needed. 04/01/21   [provider]  cetirizine (ZYRTEC) 10 MG tablet Take  10 mg by mouth daily.    [provider]  clobetasol ointment (TEMOVATE) 4.09 % Apply 1 application topically as needed.    [provider]  conjugated estrogens (PREMARIN) vaginal cream Place 1 Applicatorful vaginally daily. 03/29/21   Schuman, Christanna R, MD  cyanocobalamin (,VITAMIN B-12,) 1000 MCG/ML injection Inject 1,000 mcg into the muscle every 30 (thirty) days. 03/12/21   [provider]  Cyanocobalamin (B-12 COMPLIANCE INJECTION IJ) Inject as directed every 30 (thirty) days.    [provider]  Cyanocobalamin 1000 MCG TBCR Take by mouth.    [provider]  dicyclomine (BENTYL) 10 MG capsule TAKE 1 CAPSULE (10 MG TOTAL) BY MOUTH 4 (FOUR) TIMES DAILY - BEFORE MEALS AND AT BEDTIME. 09/29/20   [name], MD  EPINEPHrine 0.3 mg/0.3 mL IJ SOAJ injection Inject into the muscle. 03/27/17   [provider]  mometasone (ELOCON) 0.1 % ointment Apply 1 application topically as needed.    [provider]  Multiple Vitamin (MULTI-VITAMINS) TABS Take 1 tablet by mouth daily.  10/07/04   [provider]  Multiple Vitamins-Minerals (ZINC PO) Take by mouth.    [provider]  pimecrolimus (ELIDEL) 1 % cream Apply topically. 04/12/18   [provider]  polyethylene glycol-electrolytes (GAVILYTE-N WITH FLAVOR PACK) 420 g solution Drink one 8 oz glass every 20 mins until entire container is finished starting at 5:00pm on 04/20/21 04/04/21  [name], MD    Allergies as of 04/04/2021 - Review Complete 04/04/2021  Allergen Reaction Noted  . Latex Swelling 07/09/2015  . Neomycin Other (See Comments) and Rash 07/09/2015  . Neosporin  [neomycin-bacitracin zn-polymyx] Other (See Comments) 07/09/2015  . Neomycin-polymyxin-gramicidin  07/09/2015  . Other Other (See Comments) 07/09/2015    Family History  Problem Relation Age of Onset  . Clotting disorder Mother   . Hyperlipidemia Father   . Breast cancer Neg Hx      Social History   Socioeconomic History  . Marital status: Married    Spouse name: Not on file  . Number of children: Not on file  . Years of education: Not on file  . Highest education level: Not on file  Occupational History  . Not on file  Tobacco Use  . Smoking status: Never Smoker  . Smokeless tobacco: Never Used  Vaping Use  . Vaping Use: Never used  Substance and Sexual Activity  . Alcohol use: No    Alcohol/week: 0.0 standard drinks  . Drug use: No  . Sexual activity: Yes    Birth control/protection: None, Surgical    Comment: Hysterectomy  Other Topics Concern  . Not on file  Social History Narrative  . Not on file   Social Determinants of Health   Financial Resource Strain: Not on file  Food Insecurity: Not on file  Transportation Needs: Not on file  Physical Activity: Not on file  Stress: Not on file  Social Connections: Not on file  Intimate Partner Violence: Not on file    Review of Systems: See HPI, otherwise negative ROS  Physical Exam: BP (!) 143/99   Pulse 64   Temp (!) 96.7 F (35.9 C) (Temporal)   Resp 18   Ht 5\' 5"  (1.651 m)   Wt 80.7 kg   SpO2 99%   BMI 29.62 kg/m  General:   Alert,  pleasant and cooperative in NAD Head:  Normocephalic and atraumatic. Neck:  Supple; no masses or thyromegaly. Lungs:  Clear throughout to auscultation.    Heart:  Regular rate and rhythm. Abdomen:  Soft, nontender and nondistended. Normal bowel sounds, without guarding, and without rebound.   Neurologic:  Alert and  oriented x4;  grossly normal neurologically.  Impression/Plan: Dana Hensley is here for an colonoscopy to be performed for a history of adenomatous polyps on 2017  Risks, benefits, limitations, and alternatives regarding  colonoscopy have been reviewed with the patient.  Questions have been answered.  All parties agreeable.   [name], MD  04/21/2021, 9:52 AM

## 2021-04-21 NOTE — Op Note (Signed)
[facility] Gastroenterology Patient Name: Dana Hensley Procedure Date: 04/21/2021 9:50 AM MRN: [id number] Account #: [id number] Date of Birth: [birth year] Admit Type: Outpatient Age: 60 Room: [facility] ENDO ROOM 4 Gender: Female Note Status: Finalized Procedure:             Colonoscopy Indications:           High risk colon cancer surveillance: Personal history                         of colonic polyps Providers:             [name] MD, MD Referring MD:          Juluis Rainier (Referring MD) Medicines:             Propofol per Anesthesia Complications:         No immediate complications. Procedure:             Pre-Anesthesia Assessment:                        - Prior to the procedure, a History and Physical was                         performed, and patient medications and allergies were                         reviewed. The patient's tolerance of previous                         anesthesia was also reviewed. The risks and benefits                         of the procedure and the sedation options and risks                         were discussed with the patient. All questions were                         answered, and informed consent was obtained. Prior                         Anticoagulants: The patient has taken no previous                         anticoagulant or antiplatelet agents. ASA Grade                         Assessment: II - A patient with mild systemic disease.                         After reviewing the risks and benefits, the patient                         was deemed in satisfactory condition to undergo the                         procedure.  After obtaining informed consent, the colonoscope was                         passed under direct vision. Throughout the procedure,                         the patient's blood pressure, pulse, and oxygen                         saturations were monitored continuously. The                          Colonoscope was introduced through the anus and                         advanced to the the cecum, identified by appendiceal                         orifice and ileocecal valve. The colonoscopy was                         performed without difficulty. The patient tolerated                         the procedure well. The quality of the bowel                         preparation was excellent. Findings:      The perianal and digital rectal examinations were normal.      Non-bleeding internal hemorrhoids were found during retroflexion. The       hemorrhoids were Grade II (internal hemorrhoids that prolapse but reduce       spontaneously).      The exam was otherwise normal throughout the examined colon. Impression:            - Non-bleeding internal hemorrhoids.                        - No specimens collected. Recommendation:        - Discharge patient to home.                        - Resume previous diet.                        - Continue present medications.                        - Repeat colonoscopy in 5 years for surveillance. Procedure Code(s):     --- Professional ---                        [phone], Colonoscopy, flexible; diagnostic, including                         collection of specimen(s) by brushing or washing, when                         performed (separate procedure) Diagnosis Code(s):     --- Professional ---  Z86.010, Personal history of colonic polyps CPT copyright 2019 American Medical Association. All rights reserved. The codes documented in this report are preliminary and upon coder review may  be revised to meet current compliance requirements. [name] MD, MD 04/21/2021 10:11:04 AM This report has been signed electronically. Number of Addenda: 0 Note Initiated On: 04/21/2021 9:50 AM Scope Withdrawal Time: 0 hours 6 minutes 22 seconds  Total Procedure Duration: 0 hours 9 minutes 51 seconds  Estimated Blood Loss:  Estimated blood loss:  none.      [facility]

## 2021-04-21 NOTE — Anesthesia Postprocedure Evaluation (Signed)
Anesthesia Post Note  Patient: Dana Hensley  Procedure(s) Performed: COLONOSCOPY WITH PROPOFOL (N/A )  Patient location during evaluation: Endoscopy Anesthesia Type: General Level of consciousness: awake and alert Pain management: pain level controlled Vital Signs Assessment: post-procedure vital signs reviewed and stable Respiratory status: spontaneous breathing, nonlabored ventilation, respiratory function stable and patient connected to nasal cannula oxygen Cardiovascular status: blood pressure returned to baseline and stable Postop Assessment: no apparent nausea or vomiting Anesthetic complications: no   No complications documented.   Last Vitals:  Vitals:   04/21/21 0919 04/21/21 1012  BP: (!) 143/99 105/64  Pulse: 64 66  Resp: 18 12  Temp: (!) 35.9 C (!) 36.4 C  SpO2: 99% 95%    Last Pain:  Vitals:   04/21/21 1032  TempSrc:   PainSc: 0-No pain                 Precious Haws [place]

## 2021-04-21 NOTE — Anesthesia Preprocedure Evaluation (Signed)
Anesthesia Evaluation  Patient identified by MRN, date of birth, ID band Patient awake    Reviewed: Allergy & Precautions, NPO status , Patient's Chart, lab work & pertinent test results  History of Anesthesia Complications (+) PONV and history of anesthetic complications  Airway Mallampati: III  TM Distance: >3 FB Neck ROM: full    Dental  (+) Chipped, Missing   Pulmonary neg shortness of breath, asthma ,    Pulmonary exam normal        Cardiovascular Exercise Tolerance: Good (-) angina(-) Past MI negative cardio ROS Normal cardiovascular exam     Neuro/Psych PSYCHIATRIC DISORDERS negative neurological ROS     GI/Hepatic negative GI ROS, Neg liver ROS, neg GERD  ,  Endo/Other  Hypothyroidism   Renal/GU negative Renal ROS  negative genitourinary   Musculoskeletal   Abdominal   Peds  Hematology negative hematology ROS (+)   Anesthesia Other Findings Past Medical History: No date: Allergy No date: Anxiety No date: Asthma No date: B12 deficiency No date: Barrett esophagus No date: Chronic atrophic gastritis No date: DVT, lower extremity ([facility]) No date: Gastric polyps No date: HLD (hyperlipidemia) No date: Hypothyroidism No date: IGT (impaired glucose tolerance) No date: Neuroendocrine tumor     Comment:  carcinoid tumors of the stomach, s/p extraction surgery               at [facility] No date: PONV (postoperative nausea and vomiting) No date: Tumor cells, malignant ([place])     Comment:  Hx of carcinoid tumors of the stomach, s/p extraction               surgery at [facility] No date: Vitamin D deficiency  Past Surgical History: No date: ABDOMINAL HYSTERECTOMY No date: APPENDECTOMY 11/16/2016: CHOLECYSTECTOMY; N/A     Comment:  Procedure: LAPAROSCOPIC CHOLECYSTECTOMY WITH               INTRAOPERATIVE CHOLANGIOGRAM;  Surgeon: Leonie Green, MD;  Location: [facility] ORS;  Service: General;                 Laterality: N/A; No date: DIAGNOSTIC LAPAROSCOPY No date: ESOPHAGOGASTRODUODENOSCOPY No date: TONSILLECTOMY No date: VEIN LIGATION AND STRIPPING  BMI    Body Mass Index: 29.62 kg/m      Reproductive/Obstetrics negative OB ROS                             Anesthesia Physical Anesthesia Plan  ASA: III  Anesthesia Plan: General   Post-op Pain Management:    Induction: Intravenous  PONV Risk Score and Plan: Propofol infusion and TIVA  Airway Management Planned: Natural Airway and Nasal Cannula  Additional Equipment:   Intra-op Plan:   Post-operative Plan:   Informed Consent: I have reviewed the patients History and Physical, chart, labs and discussed the procedure including the risks, benefits and alternatives for the proposed anesthesia with the patient or authorized representative who has indicated his/her understanding and acceptance.     Dental Advisory Given  Plan Discussed with: Anesthesiologist, CRNA and Surgeon  Anesthesia Plan Comments: (Patient consented for risks of anesthesia including but not limited to:  - adverse reactions to medications - risk of airway placement if required - damage to eyes, teeth, lips or other oral mucosa - nerve damage due to positioning  - sore throat or hoarseness - Damage to heart, brain,  nerves, lungs, other parts of body or loss of life  Patient voiced understanding.)        Anesthesia Quick Evaluation

## 2021-04-21 NOTE — Transfer of Care (Signed)
Immediate Anesthesia Transfer of Care Note  Patient: Dana Hensley  Procedure(s) Performed: COLONOSCOPY WITH PROPOFOL (N/A )  Patient Location: PACU  Anesthesia Type:General  Level of Consciousness: sedated  Airway & Oxygen Therapy: Patient Spontanous Breathing and Patient connected to nasal cannula oxygen  Post-op Assessment: Report given to RN and Post -op Vital signs reviewed and stable  Post vital signs: Reviewed and stable  Last Vitals:  Vitals Value Taken Time  BP 105/64 04/21/21 1012  Temp 36.4 C 04/21/21 1012  Pulse 67 04/21/21 1013  Resp 16 04/21/21 1013  SpO2 99 % 04/21/21 1013  Vitals shown include unvalidated device data.  Last Pain:  Vitals:   04/21/21 1012  TempSrc: Temporal  PainSc: 0-No pain         Complications: No complications documented.

## 2021-04-22 ENCOUNTER — Encounter: Payer: Self-pay | Admitting: Gastroenterology

## 2021-06-01 ENCOUNTER — Ambulatory Visit: Payer: BC Managed Care – PPO | Admitting: Obstetrics and Gynecology

## 2021-06-03 DIAGNOSIS — M9904 Segmental and somatic dysfunction of sacral region: Secondary | ICD-10-CM | POA: Diagnosis not present

## 2021-06-03 DIAGNOSIS — M7918 Myalgia, other site: Secondary | ICD-10-CM | POA: Diagnosis not present

## 2021-06-03 DIAGNOSIS — M9903 Segmental and somatic dysfunction of lumbar region: Secondary | ICD-10-CM | POA: Diagnosis not present

## 2021-06-09 DIAGNOSIS — X32XXXA Exposure to sunlight, initial encounter: Secondary | ICD-10-CM | POA: Diagnosis not present

## 2021-06-09 DIAGNOSIS — L400 Psoriasis vulgaris: Secondary | ICD-10-CM | POA: Diagnosis not present

## 2021-06-09 DIAGNOSIS — H0260 Xanthelasma of unspecified eye, unspecified eyelid: Secondary | ICD-10-CM | POA: Diagnosis not present

## 2021-06-09 DIAGNOSIS — D225 Melanocytic nevi of trunk: Secondary | ICD-10-CM | POA: Diagnosis not present

## 2021-06-09 DIAGNOSIS — L570 Actinic keratosis: Secondary | ICD-10-CM | POA: Diagnosis not present

## 2021-06-09 DIAGNOSIS — D2262 Melanocytic nevi of left upper limb, including shoulder: Secondary | ICD-10-CM | POA: Diagnosis not present

## 2021-06-23 DIAGNOSIS — M9903 Segmental and somatic dysfunction of lumbar region: Secondary | ICD-10-CM | POA: Diagnosis not present

## 2021-06-23 DIAGNOSIS — M7918 Myalgia, other site: Secondary | ICD-10-CM | POA: Diagnosis not present

## 2021-06-23 DIAGNOSIS — M9904 Segmental and somatic dysfunction of sacral region: Secondary | ICD-10-CM | POA: Diagnosis not present

## 2021-08-02 ENCOUNTER — Emergency Department: Payer: BC Managed Care – PPO

## 2021-08-02 ENCOUNTER — Other Ambulatory Visit: Payer: Self-pay

## 2021-08-02 ENCOUNTER — Encounter: Payer: Self-pay | Admitting: Emergency Medicine

## 2021-08-02 ENCOUNTER — Observation Stay
Admission: EM | Admit: 2021-08-02 | Discharge: 2021-08-03 | Disposition: A | Discharge status: Home / Self Care | Payer: BC Managed Care – PPO | Attending: Internal Medicine | Admitting: Internal Medicine

## 2021-08-02 ENCOUNTER — Ambulatory Visit: Payer: Self-pay

## 2021-08-02 DIAGNOSIS — R0789 Other chest pain: Principal | ICD-10-CM | POA: Insufficient documentation

## 2021-08-02 DIAGNOSIS — Z20822 Contact with and (suspected) exposure to covid-19: Secondary | ICD-10-CM | POA: Insufficient documentation

## 2021-08-02 DIAGNOSIS — R079 Chest pain, unspecified: Secondary | ICD-10-CM | POA: Diagnosis present

## 2021-08-02 DIAGNOSIS — J45909 Unspecified asthma, uncomplicated: Secondary | ICD-10-CM | POA: Diagnosis not present

## 2021-08-02 DIAGNOSIS — R0602 Shortness of breath: Secondary | ICD-10-CM | POA: Diagnosis not present

## 2021-08-02 DIAGNOSIS — Z91040 Latex allergy status: Secondary | ICD-10-CM | POA: Diagnosis not present

## 2021-08-02 DIAGNOSIS — Z79899 Other long term (current) drug therapy: Secondary | ICD-10-CM | POA: Insufficient documentation

## 2021-08-02 DIAGNOSIS — E039 Hypothyroidism, unspecified: Secondary | ICD-10-CM | POA: Diagnosis not present

## 2021-08-02 DIAGNOSIS — E785 Hyperlipidemia, unspecified: Secondary | ICD-10-CM | POA: Diagnosis present

## 2021-08-02 DIAGNOSIS — R7989 Other specified abnormal findings of blood chemistry: Secondary | ICD-10-CM | POA: Diagnosis not present

## 2021-08-02 LAB — BASIC METABOLIC PANEL
Anion gap: 8 (ref 5–15)
BUN: 13 mg/dL (ref 6–20)
CO2: 26 mmol/L (ref 22–32)
Calcium: 9 mg/dL (ref 8.9–10.3)
Chloride: 104 mmol/L (ref 98–111)
Creatinine, Ser: 0.72 mg/dL (ref 0.44–1.00)
GFR, Estimated: >60 mL/min (ref >60)
Glucose, Bld: 96 mg/dL (ref 70–99)
Potassium: 3.7 mmol/L (ref 3.5–5.1)
Sodium: 138 mmol/L (ref 135–145)

## 2021-08-02 LAB — CBC
HCT: 37.1 % (ref 36.0–46.0)
HCT: 36.3 % (ref 36.0–46.0)
Hemoglobin: 12.7 g/dL (ref 12.0–15.0)
Hemoglobin: 12.2 g/dL (ref 12.0–15.0)
MCH: 29.7 pg (ref 26.0–34.0)
MCH: 29.3 pg (ref 26.0–34.0)
MCHC: 34.2 g/dL (ref 30.0–36.0)
MCHC: 33.6 g/dL (ref 30.0–36.0)
MCV: 86.9 fL (ref 80.0–100.0)
MCV: 87.1 fL (ref 80.0–100.0)
Platelets: 254 10*3/uL (ref 150–400)
Platelets: 236 10*3/uL (ref 150–400)
RBC: 4.27 MIL/uL (ref 3.87–5.11)
RBC: 4.17 MIL/uL (ref 3.87–5.11)
RDW: 13.2 % (ref 11.5–15.5)
RDW: 13.2 % (ref 11.5–15.5)
WBC: 8.2 10*3/uL (ref 4.0–10.5)
WBC: 7.7 10*3/uL (ref 4.0–10.5)
nRBC: 0 % (ref 0.0–0.2)
nRBC: 0 % (ref 0.0–0.2)

## 2021-08-02 LAB — CREATININE, SERUM
Creatinine, Ser: 0.72 mg/dL (ref 0.44–1.00)
GFR, Estimated: >60 mL/min (ref >60)

## 2021-08-02 LAB — RESP PANEL BY RT-PCR (FLU A&B, COVID) ARPGX2
Influenza A by PCR: NEGATIVE
Influenza B by PCR: NEGATIVE
SARS Coronavirus 2 by RT PCR: NEGATIVE

## 2021-08-02 LAB — TROPONIN I (HIGH SENSITIVITY)
Troponin I (High Sensitivity): 3 ng/L (ref <18)
Troponin I (High Sensitivity): 3 ng/L (ref <18)

## 2021-08-02 LAB — D-DIMER, QUANTITATIVE: D-Dimer, Quant: 0.58 ug/mL-FEU — ABNORMAL HIGH (ref 0.00–0.50)

## 2021-08-02 MED ORDER — ASPIRIN EC 81 MG PO TBEC
81.0000 mg | DELAYED_RELEASE_TABLET | Freq: Every day | ORAL | Status: DC
  Administered 2021-08-03: 81 mg via ORAL
  Filled 2021-08-02: qty 1

## 2021-08-02 MED ORDER — ACETAMINOPHEN 325 MG PO TABS: 650.0000 mg | ORAL_TABLET | ORAL | Status: DC | PRN

## 2021-08-02 MED ORDER — ONDANSETRON HCL 4 MG/2ML IJ SOLN: 4.0000 mg | Freq: Four times a day (QID) | INTRAMUSCULAR | Status: DC | PRN

## 2021-08-02 MED ORDER — NITROGLYCERIN 0.4 MG SL SUBL: 0.4000 mg | SUBLINGUAL_TABLET | SUBLINGUAL | Status: DC | PRN

## 2021-08-02 MED ORDER — HEPARIN SODIUM (PORCINE) 5000 UNIT/ML IJ SOLN
5000.0000 [IU] | Freq: Three times a day (TID) | INTRAMUSCULAR | Status: DC
  Administered 2021-08-02 – 2021-08-03 (×2): 5000 [IU] via SUBCUTANEOUS
  Filled 2021-08-02 (×2): qty 1

## 2021-08-02 MED ORDER — ATORVASTATIN CALCIUM 20 MG PO TABS
20.0000 mg | ORAL_TABLET | Freq: Every day | ORAL | Status: DC
  Administered 2021-08-02: 20 mg via ORAL

## 2021-08-02 MED ORDER — ASPIRIN 81 MG PO CHEW
324.0000 mg | CHEWABLE_TABLET | Freq: Once | ORAL | Status: AC
  Administered 2021-08-02: 324 mg via ORAL

## 2021-08-02 MED ORDER — IOHEXOL 350 MG/ML SOLN
75.0000 mL | Freq: Once | INTRAVENOUS | Status: AC | PRN
  Administered 2021-08-02: 75 mL via INTRAVENOUS

## 2021-08-02 NOTE — ED Triage Notes (Signed)
Pt reports that she has had chest pain for a week, it has gotten worse today. She has had SHOB with nausea and diaphoretic.

## 2021-08-02 NOTE — ED Provider Notes (Signed)
[facility] Emergency Department Provider Note ____________________________________________   Event Date/Time   First MD Initiated Contact with Patient 08/02/21 1515     (approximate)  I have reviewed the triage vital signs and the nursing notes.  HISTORY  Chief Complaint Chest Pain  HPI A 60 year old patient with a history of peripheral artery disease presents for evaluation of chest pain. Initial onset of pain was approximately 1-3 hours ago. The patient's chest pain is described as heaviness/pressure/tightness and is not worse with exertion. The patient reports some diaphoresis. The patient's chest pain is middle- or left-sided, is not well-localized, is not sharp and does radiate to the arms/jaw/neck. The patient does not complain of nausea. The patient has no history of stroke, has not smoked in the past 90 days, denies any history of treated diabetes, has no relevant family history of coronary artery disease (first degree relative at less than age 87), is not hypertensive, has no history of hypercholesterolemia and does not have an elevated BMI (>=30).   For 1 week now patient has noticed intermittent episodes of chest pressure will radiate slightly towards her left shoulder.  Comes and goes.  Does not report that her son who she has been around quite a bit was also diagnosed with COVID 2 days ago.  She however has not had any cough denies shortness of breath except when she gets the chest pressure sensation  No personal history of heart disease does have a history of previous DVT but reports it was remote when she was 59 years old  Past Medical History:  Diagnosis Date   Allergy    Anxiety    Asthma    B12 deficiency    Barrett esophagus    Chronic atrophic gastritis    DVT, lower extremity ([place])    Gastric polyps    HLD (hyperlipidemia)    Hypothyroidism    IGT (impaired glucose tolerance)    Neuroendocrine tumor    carcinoid tumors of the stomach,  s/p extraction surgery at [facility]   PONV (postoperative nausea and vomiting)    Vitamin D deficiency     Patient Active Problem List   Diagnosis Date Noted   Other chest pain 08/02/2021   Chest pain 08/02/2021   Hx of colonic polyps    Hyperlipidemia, unspecified 08/29/2018   IGT (impaired glucose tolerance) 08/29/2018   Intrinsic atopic dermatitis 08/29/2018   History of adenomatous polyp of colon 04/01/2018   Elevated liver function tests 01/18/2018   Weight loss 01/15/2017   Pain in limb 12/18/2016   Calculus of gallbladder without cholecystitis without obstruction 10/23/2016   Chronic venous insufficiency 10/16/2016   Varicose veins of both lower extremities with pain 10/16/2016   Lymphedema 10/16/2016   Epigastric pain 10/03/2016   Barrett's esophagus determined by endoscopy 01/25/2016   Vitamin B12 deficiency 10/18/2015   Chronic neck and back pain 08/19/2015   Allergic rhinitis with postnasal drip 07/09/2015   Dyslipidemia 05/05/2015   Acquired hypothyroidism 05/05/2015   Carcinoid tumor 05/05/2015   History of malignant carcinoid tumor of stomach 03/26/2012    Past Surgical History:  Procedure Laterality Date   ABDOMINAL HYSTERECTOMY     APPENDECTOMY     CHOLECYSTECTOMY N/A 11/16/2016   Procedure: LAPAROSCOPIC CHOLECYSTECTOMY WITH INTRAOPERATIVE CHOLANGIOGRAM;  Surgeon: Leonie Green, MD;  Location: [facility] ORS;  Service: General;  Laterality: N/A;   COLONOSCOPY WITH PROPOFOL N/A 04/21/2021   Procedure: COLONOSCOPY WITH PROPOFOL;  Surgeon: [name], MD;  Location: [facility] ENDOSCOPY;  Service: Endoscopy;  Laterality: N/A;   DIAGNOSTIC LAPAROSCOPY     ESOPHAGOGASTRODUODENOSCOPY     TONSILLECTOMY     VEIN LIGATION AND STRIPPING      Prior to Admission medications   Medication Sig Start Date End Date Taking? Authorizing Provider  Ascorbic Acid (VITAMIN C) 1000 MG tablet Take 1,000 mg by mouth daily.    [provider]  augmented betamethasone dipropionate  (DIPROLENE-AF) 0.05 % ointment Apply topically 2 (two) times daily as needed. 04/01/21   [provider]  cetirizine (ZYRTEC) 10 MG tablet Take 10 mg by mouth daily.    [provider]  clobetasol ointment (TEMOVATE) [id number] % Apply 1 application topically as needed.    [provider]  conjugated estrogens (PREMARIN) vaginal cream Place 1 Applicatorful vaginally daily. 03/29/21   Schuman, Christanna R, MD  cyanocobalamin (,VITAMIN B-12,) 1000 MCG/ML injection Inject 1,000 mcg into the muscle every 30 (thirty) days. 03/12/21   [provider]  Cyanocobalamin (B-12 COMPLIANCE INJECTION IJ) Inject as directed every 30 (thirty) days.    [provider]  Cyanocobalamin 1000 MCG TBCR Take by mouth.    [provider]  dicyclomine (BENTYL) 10 MG capsule TAKE 1 CAPSULE (10 MG TOTAL) BY MOUTH 4 (FOUR) TIMES DAILY - BEFORE MEALS AND AT BEDTIME. 09/29/20   [name], MD  EPINEPHrine 0.3 mg/0.3 mL IJ SOAJ injection Inject into the muscle. 03/27/17   [provider]  ferrous sulfate 325 (65 FE) MG tablet Take 325 mg by mouth daily with breakfast.    [provider]  levothyroxine (SYNTHROID, LEVOTHROID) 75 MCG tablet Take 1 tablet (75 mcg total) by mouth daily. 03/15/16   Lada, Satira Anis, MD  mometasone (ELOCON) 0.1 % ointment Apply 1 application topically as needed.    [provider]  Multiple Vitamin (MULTI-VITAMINS) TABS Take 1 tablet by mouth daily.  10/07/04   [provider]  Multiple Vitamins-Minerals (ZINC PO) Take by mouth.    [provider]  pimecrolimus (ELIDEL) 1 % cream Apply topically. 04/12/18   [provider]  polyethylene glycol-electrolytes (GAVILYTE-N WITH FLAVOR PACK) 420 g solution Drink one 8 oz glass every 20 mins until entire container is finished starting at 5:00pm on 04/20/21 04/04/21   [name], MD    Allergies Latex, Neomycin, Neosporin  [neomycin-bacitracin zn-polymyx],  Neomycin-polymyxin-gramicidin, and Other  Family History  Problem Relation Age of Onset   Clotting disorder Mother    Hyperlipidemia Father    Breast cancer Neg Hx     Social History Social History   Tobacco Use   Smoking status: Never   Smokeless tobacco: Never  Vaping Use   Vaping Use: Never used  Substance Use Topics   Alcohol use: No    Alcohol/week: 0.0 standard drinks   Drug use: No    Review of Systems Constitutional: No fever/chills Eyes: No visual changes. ENT: No sore throat. Cardiovascular: Denies chest pain. Respiratory: Denies shortness of breath. Gastrointestinal: No abdominal pain.   Genitourinary: Negative for dysuria. Musculoskeletal: Negative for back pain. Skin: Negative for rash. Neurological: Negative for headaches, areas of focal weakness or numbness.    ____________________________________________   PHYSICAL EXAM:  VITAL SIGNS: ED Triage Vitals  Enc Vitals Group     BP 08/02/21 1404 126/71     Pulse Rate 08/02/21 1404 71     Resp 08/02/21 1404 18     Temp 08/02/21 1404 98.9 F (37.2 C)     Temp src --  SpO2 08/02/21 1404 95 %     Weight 08/02/21 1402 177 lb 14.6 oz (80.7 kg)     Height 08/02/21 1402 '5\' 5"'$  (1.651 m)     Head Circumference --      Peak Flow --      Pain Score 08/02/21 1401 6     Pain Loc --      Pain Edu? --      Excl. in [place]? --     Constitutional: Alert and oriented. Well appearing and in no acute distress. Eyes: Conjunctivae are normal. Head: Atraumatic. Nose: No congestion/rhinnorhea. Mouth/Throat: Mucous membranes are moist. Neck: No stridor.  Cardiovascular: Normal rate, regular rhythm. Grossly normal heart sounds.  Good peripheral circulation. Respiratory: Normal respiratory effort.  No retractions. Lungs CTAB. Gastrointestinal: Soft and nontender. No distention. Musculoskeletal: No lower extremity tenderness nor edema. Neurologic:  Normal speech and language. No gross focal neurologic deficits are  appreciated.  Skin:  Skin is warm, dry and intact. No rash noted. Psychiatric: Mood and affect are normal. Speech and behavior are normal.  ____________________________________________   LABS (all labs ordered are listed, but only abnormal results are displayed)  Labs Reviewed  D-DIMER, QUANTITATIVE - Abnormal; Notable for the following components:      Result Value   D-Dimer, Quant 0.58 (*)    All other components within normal limits  RESP PANEL BY RT-PCR (FLU A&B, COVID) ARPGX2  BASIC METABOLIC PANEL  CBC  CBC  CREATININE, SERUM  MAGNESIUM  COMPREHENSIVE METABOLIC PANEL  HEMOGLOBIN A1C  CBC WITH DIFFERENTIAL/PLATELET  LIPID PANEL  HIV ANTIBODY (ROUTINE TESTING W REFLEX)  POC URINE PREG, ED  TROPONIN I (HIGH SENSITIVITY)  TROPONIN I (HIGH SENSITIVITY)   ____________________________________________  EKG  Reviewed inter by me at 1400 Heart rate 85 QRS 75 QTc 440 Normal sinus rhythm, mild nonspecific T wave abnormality is present but more pronounced in inferior lateral distribution suggestive of possible mild ischemia or other acute cardiac abnormality  ____________________________________________  RADIOLOGY  DG Chest 2 View  Result Date: 08/02/2021 CLINICAL DATA:  Chest pain EXAM: CHEST - 2 VIEW COMPARISON:  04/16/2017 FINDINGS: Midline trachea. Normal heart size and mediastinal contours. No pleural effusion or pneumothorax. Clear lungs. IMPRESSION: No acute cardiopulmonary disease. Electronically Signed   By: Abigail Miyamoto M.D.   On: 08/02/2021 14:58   CT Angio Chest PE W and/or Wo Contrast  Result Date: 08/02/2021 CLINICAL DATA:  Rule out acute pulmonary embolus. Positive D-dimer. Chest pain for 1 week. Shortness of breath with nausea and diaphoresis. EXAM: CT ANGIOGRAPHY CHEST WITH CONTRAST TECHNIQUE: Multidetector CT imaging of the chest was performed using the standard protocol during bolus administration of intravenous contrast. Multiplanar CT image reconstructions  and MIPs were obtained to evaluate the vascular anatomy. CONTRAST:  68m OMNIPAQUE IOHEXOL 350 MG/ML SOLN COMPARISON:  None. FINDINGS: Cardiovascular: Satisfactory opacification of the pulmonary arteries to the segmental level. No evidence of pulmonary embolism. Normal heart size. No pericardial effusion. Mild aortic atherosclerosis and coronary artery calcifications. Mediastinum/Nodes: No enlarged mediastinal, hilar, or axillary lymph nodes. Thyroid gland, trachea, and esophagus demonstrate no significant findings. Lungs/Pleura: No pleural effusion, airspace consolidation, or pneumothorax. Mild interlobular septal thickening with ground-glass attenuation is noted with a lower lung zone predominance. Upper Abdomen: No acute abnormality. Left lobe of liver cyst measures 1.4 cm. Cholecystectomy. Musculoskeletal: No chest wall abnormality. No acute or significant osseous findings. Review of the MIP images confirms the above findings. IMPRESSION: 1. No evidence for acute pulmonary embolus. 2. Mild  interlobular septal thickening with ground-glass attenuation is noted with a lower lung zone predominance. Findings are nonspecific and may reflect early pulmonary edema. 3. Aortic atherosclerosis and coronary artery calcifications. Aortic Atherosclerosis (ICD10-I70.0). Electronically Signed   By: Kerby Moors M.D.   On: 08/02/2021 18:47    Chest x-ray reviewed negative for acute  CT chest reviewed negative for acute PE.  Noted finding including nonspecific finding and interlobular septal thickening  Aortic atherosclerosis and coronary artery calcifications noted ____________________________________________   PROCEDURES  Procedure(s) performed: None  Procedures  Critical Care performed: No  ____________________________________________   INITIAL IMPRESSION / ASSESSMENT AND PLAN / ED COURSE  Pertinent labs & imaging results that were available during my care of the patient were reviewed by me and  considered in my medical decision making (see chart for details).   Differential diagnosis includes, but is not limited to, ACS, aortic dissection, pulmonary embolism, cardiac tamponade, pneumothorax, pneumonia, pericarditis, myocarditis, GI-related causes including esophagitis/gastritis, and musculoskeletal chest wall pain.     Clinical Course as of 08/02/21 2139  Tue Aug 02, 2021  1751 Patient's repeat EKG is reviewed at 1615 which appears quite normal.  Ventricular rate 60 QRS 89 QTc 420 Compared to the patient's EKG on presentation these ST segments in the left lateral precordial leads now appear normal.  This does raise concern for potential unstable angina in the setting of her thus far normal troponins.  She is currently pain-free now but did report having pain and discomfort at the time of her initial EKG. [MQ]  1751 Patient is resting comfortably understands my recommendation for to be admitted for further cardiac work-up as well as her pending CT scan.  Wish to also exclude pulmonary embolism given mildly elevated D-dimer.  Patient currently pain and symptom-free [MQ]  20 Admit discussed with Dr. Dwyane Dee [MQ]    Clinical Course User Index [MQ] Delman Kitten, MD   Reviewed the patient's clinical history EKG findings initial as well as repeat EKG and patient's  Admission discussed with hospitalist Dr. Eugenia Pancoast  ____________________________________________   FINAL CLINICAL IMPRESSION(S) / ED DIAGNOSES  Final diagnoses:  Chest pain with high risk for cardiac etiology        Note:  This document was prepared using Dragon voice recognition software and may include unintentional dictation errors       Delman Kitten, MD 08/02/21 2140

## 2021-08-02 NOTE — ED Notes (Signed)
Pt up to bathroom  Pt eating.  Family with pt.

## 2021-08-02 NOTE — H&P (Signed)
History and Physical    Dana Hensley [id number] DOB: [birth year] DOA: 08/02/2021  PCP: Sallee Lange, NP  Patient coming from: home  I have personally briefly reviewed patient's old medical records in [place]  Chief Complaint: cp  HPI: Dana Hensley is a 60 y.o. female with medical history significant of PAD presents with chest pain that started 12 3 hours prior to arrival in the ED does have a 9.  Patient had chest pressure and tightness with some sweating lefts-year-old or chills.  Kind of vague diffuse achy no nausea.  Patient denies any family history of CAD tobacco abuse, prior MI.  Patient does endorse intermittent episodes of chest pressure to her left shoulder over the last week that is not worsened with exertion.  Of note patient history of DVT at 60 years old.  EKG normal sinus rhythm with troponin negative x2.  Blood pressures 130s over 70s respiration 17 heart rate 77.  98% on room air.  BMP and CBC within normal limits COVID and flu are negative.  D-dimer 0.58.  CT of the chest no PE but mild interlobular septal thickening groundglass tubulation lower lung zone predominance findings nonspecific may represent early pulmonary edema aortic atherosclerosis and CAD.  Chest x-ray negative patient given aspirin 324 in the ED.  Cp 2/10, some tightness intermittently radiates to left shoulder and back but no particular trigger.  Patient husband at bedside who provide history as well Mr. Ruby Cola.  Patient has not had any stress test recently, no stress test in the last year.  ED Course: As above  Review of Systems: As per HPI otherwise all other systems reviewed and are negative.   Past Medical History:  Diagnosis Date   Allergy    Anxiety    Asthma    B12 deficiency    Barrett esophagus    Chronic atrophic gastritis    DVT, lower extremity (HCC)    Gastric polyps    HLD (hyperlipidemia)    Hypothyroidism    IGT (impaired glucose tolerance)    Neuroendocrine  tumor    carcinoid tumors of the stomach, s/p extraction surgery at [facility]   PONV (postoperative nausea and vomiting)    Vitamin D deficiency     Past Surgical History:  Procedure Laterality Date   ABDOMINAL HYSTERECTOMY     APPENDECTOMY     CHOLECYSTECTOMY N/A 11/16/2016   Procedure: LAPAROSCOPIC CHOLECYSTECTOMY WITH INTRAOPERATIVE CHOLANGIOGRAM;  Surgeon: Leonie Green, MD;  Location: [facility] ORS;  Service: General;  Laterality: N/A;   COLONOSCOPY WITH PROPOFOL N/A 04/21/2021   Procedure: COLONOSCOPY WITH PROPOFOL;  Surgeon: [name], MD;  Location: [facility] ENDOSCOPY;  Service: Endoscopy;  Laterality: N/A;   DIAGNOSTIC LAPAROSCOPY     ESOPHAGOGASTRODUODENOSCOPY     TONSILLECTOMY     VEIN LIGATION AND STRIPPING      Social History  reports that she has never smoked. She has never used smokeless tobacco. She reports that she does not drink alcohol and does not use drugs.  Allergies  Allergen Reactions   Latex Swelling    SWELLING AROUND FACE SWELLING AROUND FACE   Neomycin Other (See Comments) and Rash    Seen from allergy skin test Seen from allergy skin test   Neosporin  [Neomycin-Bacitracin Zn-Polymyx] Other (See Comments)    Seen from allergy skin test   Neomycin-Polymyxin-Gramicidin     Other reaction(s): Unknown Seen from allergy skin test   Other Other (See Comments)    THIMERSOL.  THIMERSOL- seen from allergy skin test Other reaction(s): Unknown THIMERSOL. THIMERSOL- seen from allergy skin test    Family History  Problem Relation Age of Onset   Clotting disorder Mother    Hyperlipidemia Father    Breast cancer Neg Hx      Prior to Admission medications   Medication Sig Start Date End Date Taking? Authorizing Provider  Ascorbic Acid (VITAMIN C) 1000 MG tablet Take 1,000 mg by mouth daily.    [provider]  augmented betamethasone dipropionate (DIPROLENE-AF) 0.05 % ointment Apply topically 2 (two) times daily as needed. 04/01/21   [provider]  cetirizine (ZYRTEC) 10 MG tablet Take 10 mg by mouth daily.    [provider]  clobetasol ointment (TEMOVATE) [id number] % Apply 1 application topically as needed.    [provider]  conjugated estrogens (PREMARIN) vaginal cream Place 1 Applicatorful vaginally daily. 03/29/21   Schuman, Christanna R, MD  cyanocobalamin (,VITAMIN B-12,) 1000 MCG/ML injection Inject 1,000 mcg into the muscle every 30 (thirty) days. 03/12/21   [provider]  Cyanocobalamin (B-12 COMPLIANCE INJECTION IJ) Inject as directed every 30 (thirty) days.    [provider]  Cyanocobalamin 1000 MCG TBCR Take by mouth.    [provider]  dicyclomine (BENTYL) 10 MG capsule TAKE 1 CAPSULE (10 MG TOTAL) BY MOUTH 4 (FOUR) TIMES DAILY - BEFORE MEALS AND AT BEDTIME. 09/29/20   [name], MD  EPINEPHrine 0.3 mg/0.3 mL IJ SOAJ injection Inject into the muscle. 03/27/17   [provider]  ferrous sulfate 325 (65 FE) MG tablet Take 325 mg by mouth daily with breakfast.    [provider]  levothyroxine (SYNTHROID, LEVOTHROID) 75 MCG tablet Take 1 tablet (75 mcg total) by mouth daily. 03/15/16   Lada, Satira Anis, MD  mometasone (ELOCON) 0.1 % ointment Apply 1 application topically as needed.    [provider]  Multiple Vitamin (MULTI-VITAMINS) TABS Take 1 tablet by mouth daily.  10/07/04   [provider]  Multiple Vitamins-Minerals (ZINC PO) Take by mouth.    [provider]  pimecrolimus (ELIDEL) 1 % cream Apply topically. 04/12/18   [provider]  polyethylene glycol-electrolytes (GAVILYTE-N WITH FLAVOR PACK) 420 g solution Drink one 8 oz glass every 20 mins until entire container is finished starting at 5:00pm on 04/20/21 04/04/21   [name], MD    Physical Exam: Vitals:   08/02/21 1402 08/02/21 1404 08/02/21 1733  BP:  126/71 130/70  Pulse:  71 77  Resp:  18 17  Temp:  98.9 F (37.2 C)   SpO2:  95% 98%  Weight: 80.7  kg    Height: '5\' 5"'$  (1.651 m)      Constitutional: NAD, calm, comfortable Vitals:   08/02/21 1402 08/02/21 1404 08/02/21 1733  BP:  126/71 130/70  Pulse:  71 77  Resp:  18 17  Temp:  98.9 F (37.2 C)   SpO2:  95% 98%  Weight: 80.7 kg    Height: '5\' 5"'$  (1.651 m)     Eyes: PERRL, lids and conjunctivae normal ENMT: Mucous membranes are moist. Posterior pharynx clear of any exudate or lesions.Normal dentition.  Neck: normal, supple, no masses, no thyromegaly Respiratory: clear to auscultation bilaterally, no wheezing, no crackles. Normal respiratory effort. No accessory muscle use.  Cardiovascular: Regular rate and rhythm, no murmurs / rubs / gallops.  Trace edema bilateral lower extremity. 2+ pedal pulses.  Nonreproducible with palpation. Abdomen: no tenderness, no masses palpated.  No hepatosplenomegaly. Bowel sounds positive.  Musculoskeletal: no clubbing / cyanosis. No joint deformity upper and lower extremities. Good ROM, no contractures. Normal muscle tone.  Skin: no rashes, lesions, ulcers. No induration Neurologic: CN 2-12 grossly intact. Sensation intact, Strength 5/5 in all 4.  Psychiatric: Normal judgment and insight. Alert and oriented x 3. Normal mood.   Labs on Admission: I have personally reviewed following labs and imaging studies  CBC: Recent Labs  Lab 08/02/21 1402  WBC 8.2  HGB 12.7  HCT 37.1  MCV 86.9  PLT 0000000    Basic Metabolic Panel: Recent Labs  Lab 08/02/21 1402  NA 138  K 3.7  CL 104  CO2 26  GLUCOSE 96  BUN 13  CREATININE 0.72  CALCIUM 9.0    GFR: Estimated Creatinine Clearance: 79.5 mL/min (by C-G formula based on SCr of 0.72 mg/dL).  Liver Function Tests: No results for input(s): AST, ALT, ALKPHOS, BILITOT, PROT, ALBUMIN in the last 168 hours.  Urine analysis:    Component Value Date/Time   BILIRUBINUR Negative 11/26/2015 1048   PROTEINUR Negative 11/26/2015 1048   UROBILINOGEN 0.2 11/26/2015 1048   NITRITE Negative 11/26/2015  1048   LEUKOCYTESUR Negative 11/26/2015 1048    Radiological Exams on Admission: DG Chest 2 View  Result Date: 08/02/2021 CLINICAL DATA:  Chest pain EXAM: CHEST - 2 VIEW COMPARISON:  04/16/2017 FINDINGS: Midline trachea. Normal heart size and mediastinal contours. No pleural effusion or pneumothorax. Clear lungs. IMPRESSION: No acute cardiopulmonary disease. Electronically Signed   By: Abigail Miyamoto M.D.   On: 08/02/2021 14:58   CT Angio Chest PE W and/or Wo Contrast  Result Date: 08/02/2021 CLINICAL DATA:  Rule out acute pulmonary embolus. Positive D-dimer. Chest pain for 1 week. Shortness of breath with nausea and diaphoresis. EXAM: CT ANGIOGRAPHY CHEST WITH CONTRAST TECHNIQUE: Multidetector CT imaging of the chest was performed using the standard protocol during bolus administration of intravenous contrast. Multiplanar CT image reconstructions and MIPs were obtained to evaluate the vascular anatomy. CONTRAST:  81m OMNIPAQUE IOHEXOL 350 MG/ML SOLN COMPARISON:  None. FINDINGS: Cardiovascular: Satisfactory opacification of the pulmonary arteries to the segmental level. No evidence of pulmonary embolism. Normal heart size. No pericardial effusion. Mild aortic atherosclerosis and coronary artery calcifications. Mediastinum/Nodes: No enlarged mediastinal, hilar, or axillary lymph nodes. Thyroid gland, trachea, and esophagus demonstrate no significant findings. Lungs/Pleura: No pleural effusion, airspace consolidation, or pneumothorax. Mild interlobular septal thickening with ground-glass attenuation is noted with a lower lung zone predominance. Upper Abdomen: No acute abnormality. Left lobe of liver cyst measures 1.4 cm. Cholecystectomy. Musculoskeletal: No chest wall abnormality. No acute or significant osseous findings. Review of the MIP images confirms the above findings. IMPRESSION: 1. No evidence for acute pulmonary embolus. 2. Mild interlobular septal thickening with ground-glass attenuation is noted  with a lower lung zone predominance. Findings are nonspecific and may reflect early pulmonary edema. 3. Aortic atherosclerosis and coronary artery calcifications. Aortic Atherosclerosis (ICD10-I70.0). Electronically Signed   By: TKerby MoorsM.D.   On: 08/02/2021 18:47    EKG: Independently reviewed.   Assessment/Plan Principal Problem:   Other chest pain Active Problems:   Dyslipidemia   Acquired hypothyroidism 60year old female with a history of PAD presents with intermittent chest pain, chest pain rule out ACS. Chest pain rule out ACS-we will trend troponins as needed Dilaudid-monitor on aspirin Follow-up hemoglobin A1c lipid panel N.p.o. for midnight for stress test in the morning  Hypothyroidism-continue Synthroid  Iron deficiency anemia-continue ferrous sulfate  Disposition-observation, telemetry,  continue to monitor  DVT prophylaxis: Heparin. Code Status:   Full Family Communication:  Husband-Mr. Ferd Glassing [id number] Disposition Plan:   Patient is from:  Home  Anticipated DC to:  Home  Anticipated DC date:  1 to 2 days  Anticipated DC barriers: Chest pain  Consults called:  Cardiology-Dr. Hassan Buckler by ED provider Admission status:  Obs   Severity of Illness: The appropriate patient status for this patient is OBSERVATION. Observation status is judged to be reasonable and necessary in order to provide the required intensity of service to ensure the patient's safety. The patient's presenting symptoms, physical exam findings, and initial radiographic and laboratory data in the context of their medical condition is felt to place them at decreased risk for further clinical deterioration. Furthermore, it is anticipated that the patient will be medically stable for discharge from the hospital within 2 midnights of admission. The following factors support the patient status of observation.   " The patient's presenting symptoms include as above. " The physical exam  findings include as above. " The initial radiographic and laboratory data are as above.    Jacqlyn Krauss MD [facility] [id number] How to contact the [facility] Attending or Consulting provider [place] or covering provider during after hours [place], for this patient?   Check the care team in [facility] and look for a) attending/consulting [facility] provider listed and b) the [facility] team listed Log into [email or web address] and use [place]'s universal password to access. If you do not have the password, please contact the hospital operator. Locate the [facility] provider you are looking for under [facility] and page to a number that you can be directly reached. If you still have difficulty reaching the provider, please page the [facility] (Director on Call) for the Hospitalists listed on amion for assistance.  08/02/2021, 7:20 PM

## 2021-08-03 ENCOUNTER — Observation Stay: Payer: BC Managed Care – PPO

## 2021-08-03 DIAGNOSIS — E785 Hyperlipidemia, unspecified: Secondary | ICD-10-CM

## 2021-08-03 DIAGNOSIS — R0789 Other chest pain: Secondary | ICD-10-CM | POA: Diagnosis not present

## 2021-08-03 DIAGNOSIS — R079 Chest pain, unspecified: Secondary | ICD-10-CM | POA: Diagnosis not present

## 2021-08-03 LAB — COMPREHENSIVE METABOLIC PANEL
ALT: 10 U/L (ref 0–44)
AST: 13 U/L — ABNORMAL LOW (ref 15–41)
Albumin: 3.7 g/dL (ref 3.5–5.0)
Alkaline Phosphatase: 52 U/L (ref 38–126)
Anion gap: 10 (ref 5–15)
BUN: 13 mg/dL (ref 6–20)
CO2: 26 mmol/L (ref 22–32)
Calcium: 9.1 mg/dL (ref 8.9–10.3)
Chloride: 102 mmol/L (ref 98–111)
Creatinine, Ser: 0.59 mg/dL (ref 0.44–1.00)
GFR, Estimated: >60 mL/min (ref >60)
Glucose, Bld: 94 mg/dL (ref 70–99)
Potassium: 3.8 mmol/L (ref 3.5–5.1)
Sodium: 138 mmol/L (ref 135–145)
Total Bilirubin: 0.8 mg/dL (ref 0.3–1.2)
Total Protein: 7 g/dL (ref 6.5–8.1)

## 2021-08-03 LAB — HEMOGLOBIN A1C
Hgb A1c MFr Bld: 5.5 % (ref 4.8–5.6)
Mean Plasma Glucose: 111.15 mg/dL

## 2021-08-03 LAB — LIPID PANEL
Cholesterol: 263 mg/dL — ABNORMAL HIGH (ref 0–200)
HDL: 39 mg/dL — ABNORMAL LOW (ref >40)
LDL Cholesterol: 200 mg/dL — ABNORMAL HIGH (ref 0–99)
Total CHOL/HDL Ratio: 6.7 RATIO
Triglycerides: 122 mg/dL (ref <150)
VLDL: 24 mg/dL (ref 0–40)

## 2021-08-03 LAB — CBC WITH DIFFERENTIAL/PLATELET
Abs Immature Granulocytes: 0.01 10*3/uL (ref 0.00–0.07)
Basophils Absolute: 0 10*3/uL (ref 0.0–0.1)
Basophils Relative: 0 %
Eosinophils Absolute: 0.2 10*3/uL (ref 0.0–0.5)
Eosinophils Relative: 3 %
HCT: 36.8 % (ref 36.0–46.0)
Hemoglobin: 12.1 g/dL (ref 12.0–15.0)
Immature Granulocytes: 0 %
Lymphocytes Relative: 42 %
Lymphs Abs: 2.3 10*3/uL (ref 0.7–4.0)
MCH: 28.7 pg (ref 26.0–34.0)
MCHC: 32.9 g/dL (ref 30.0–36.0)
MCV: 87.4 fL (ref 80.0–100.0)
Monocytes Absolute: 0.3 10*3/uL (ref 0.1–1.0)
Monocytes Relative: 6 %
Neutro Abs: 2.7 10*3/uL (ref 1.7–7.7)
Neutrophils Relative %: 49 %
Platelets: 234 10*3/uL (ref 150–400)
RBC: 4.21 MIL/uL (ref 3.87–5.11)
RDW: 13.2 % (ref 11.5–15.5)
WBC: 5.5 10*3/uL (ref 4.0–10.5)
nRBC: 0 % (ref 0.0–0.2)

## 2021-08-03 LAB — HIV ANTIBODY (ROUTINE TESTING W REFLEX): HIV Screen 4th Generation wRfx: NONREACTIVE

## 2021-08-03 LAB — MAGNESIUM: Magnesium: 2.2 mg/dL (ref 1.7–2.4)

## 2021-08-03 MED ORDER — ATORVASTATIN CALCIUM 20 MG PO TABS: 20.0000 mg | ORAL_TABLET | Freq: Every day | ORAL | 0 refills | Status: DC

## 2021-08-03 NOTE — Consult Note (Signed)
Dana Hensley is a 60 y.o. female  [id number]  Primary Cardiologist: Neoma Laming, MD Reason for Consultation: chest pain  HPI: A 61 year old patient with a history of peripheral artery disease presents for evaluation of chest pain. No personal history of heart disease does have a history of previous DVT but reports it was remote when she was 60 years old.   Review of Systems: Patient denies current chest pain, shortness of breath, dizziness, nausea, vomiting.   Past Medical History:  Diagnosis Date   Allergy    Anxiety    Asthma    B12 deficiency    Barrett esophagus    Chronic atrophic gastritis    DVT, lower extremity (HCC)    Gastric polyps    HLD (hyperlipidemia)    Hypothyroidism    IGT (impaired glucose tolerance)    Neuroendocrine tumor    carcinoid tumors of the stomach, s/p extraction surgery at [facility]   PONV (postoperative nausea and vomiting)    Vitamin D deficiency     (Not in a hospital admission)     aspirin EC  81 mg Oral Daily   atorvastatin  20 mg Oral Daily   heparin  5,000 Units Subcutaneous Q8H    Infusions:   Allergies  Allergen Reactions   Latex Swelling    SWELLING AROUND FACE SWELLING AROUND FACE   Neomycin Other (See Comments) and Rash    Seen from allergy skin test Seen from allergy skin test   Neosporin  [Neomycin-Bacitracin Zn-Polymyx] Other (See Comments)    Seen from allergy skin test   Neomycin-Polymyxin-Gramicidin     Other reaction(s): Unknown Seen from allergy skin test   Other Other (See Comments)    THIMERSOL. THIMERSOL- seen from allergy skin test Other reaction(s): Unknown THIMERSOL. THIMERSOL- seen from allergy skin test    Social History   Socioeconomic History   Marital status: Married    Spouse name: Not on file   Number of children: Not on file   Years of education: Not on file   Highest education level: Not on file  Occupational History   Not on file  Tobacco Use   Smoking status: Never    Smokeless tobacco: Never  Vaping Use   Vaping Use: Never used  Substance and Sexual Activity   Alcohol use: No    Alcohol/week: 0.0 standard drinks   Drug use: No   Sexual activity: Yes    Birth control/protection: None, Surgical    Comment: Hysterectomy  Other Topics Concern   Not on file  Social History Narrative   Not on file   Social Determinants of Health   Financial Resource Strain: Not on file  Food Insecurity: Not on file  Transportation Needs: Not on file  Physical Activity: Not on file  Stress: Not on file  Social Connections: Not on file  Intimate Partner Violence: Not on file    Family History  Problem Relation Age of Onset   Clotting disorder Mother    Hyperlipidemia Father    Breast cancer Neg Hx     PHYSICAL EXAM: Vitals:   08/03/21 0700 08/03/21 0800  BP: 114/78 118/76  Pulse: (!) 51 (!) 55  Resp: 17 19  Temp:  97.9 F (36.6 C)  SpO2: 98% 100%    No intake or output data in the 24 hours ending 08/03/21 0852  General:  Well appearing. No respiratory difficulty HEENT: normal Neck: supple. no JVD. Carotids 2+ bilat; no bruits. No lymphadenopathy or  thryomegaly appreciated. Cor: PMI nondisplaced. Regular rate & rhythm. No rubs, gallops or murmurs. Lungs: clear Abdomen: soft, nontender, nondistended. No hepatosplenomegaly. No bruits or masses. Good bowel sounds. Extremities: no cyanosis, clubbing, rash, edema Neuro: alert & oriented x 3, cranial nerves grossly intact. moves all 4 extremities w/o difficulty. Affect pleasant.  ECG: sinus bradycardia, HR 55 bpm, low voltage QRS  Results for orders placed or performed during the hospital encounter of 08/02/21 (from the past 24 hour(s))  Basic metabolic panel     Status: None   Collection Time: 08/02/21  2:02 PM  Result Value Ref Range   Sodium 138 135 - 145 mmol/L   Potassium 3.7 3.5 - 5.1 mmol/L   Chloride 104 98 - 111 mmol/L   CO2 26 22 - 32 mmol/L   Glucose, Bld 96 70 - 99 mg/dL   BUN 13 6 -  20 mg/dL   Creatinine, Ser 0.72 0.44 - 1.00 mg/dL   Calcium 9.0 8.9 - 10.3 mg/dL   GFR, Estimated >60 >60 mL/min   Anion gap 8 5 - 15  CBC     Status: None   Collection Time: 08/02/21  2:02 PM  Result Value Ref Range   WBC 8.2 4.0 - 10.5 K/uL   RBC 4.27 3.87 - 5.11 MIL/uL   Hemoglobin 12.7 12.0 - 15.0 g/dL   HCT 37.1 36.0 - 46.0 %   MCV 86.9 80.0 - 100.0 fL   MCH 29.7 26.0 - 34.0 pg   MCHC 34.2 30.0 - 36.0 g/dL   RDW 13.2 11.5 - 15.5 %   Platelets 254 150 - 400 K/uL   nRBC 0.0 0.0 - 0.2 %  Troponin I (High Sensitivity)     Status: None   Collection Time: 08/02/21  2:02 PM  Result Value Ref Range   Troponin I (High Sensitivity) 3 <18 ng/L  Troponin I (High Sensitivity)     Status: None   Collection Time: 08/02/21  3:40 PM  Result Value Ref Range   Troponin I (High Sensitivity) 3 <18 ng/L  D-dimer, quantitative     Status: Abnormal   Collection Time: 08/02/21  3:40 PM  Result Value Ref Range   D-Dimer, Quant 0.58 (H) 0.00 - 0.50 ug/mL-FEU  Resp Panel by RT-PCR (Flu A&B, Covid) Nasopharyngeal Swab     Status: None   Collection Time: 08/02/21  3:45 PM   Specimen: Nasopharyngeal Swab; Nasopharyngeal(NP) swabs in vial transport medium  Result Value Ref Range   SARS Coronavirus 2 by RT PCR NEGATIVE NEGATIVE   Influenza A by PCR NEGATIVE NEGATIVE   Influenza B by PCR NEGATIVE NEGATIVE  CBC     Status: None   Collection Time: 08/02/21  8:31 PM  Result Value Ref Range   WBC 7.7 4.0 - 10.5 K/uL   RBC 4.17 3.87 - 5.11 MIL/uL   Hemoglobin 12.2 12.0 - 15.0 g/dL   HCT 36.3 36.0 - 46.0 %   MCV 87.1 80.0 - 100.0 fL   MCH 29.3 26.0 - 34.0 pg   MCHC 33.6 30.0 - 36.0 g/dL   RDW 13.2 11.5 - 15.5 %   Platelets 236 150 - 400 K/uL   nRBC 0.0 0.0 - 0.2 %  Creatinine, serum     Status: None   Collection Time: 08/02/21  8:31 PM  Result Value Ref Range   Creatinine, Ser 0.72 0.44 - 1.00 mg/dL   GFR, Estimated >60 >60 mL/min  Magnesium     Status: None  Collection Time: 08/03/21  6:10 AM   Result Value Ref Range   Magnesium 2.2 1.7 - 2.4 mg/dL  Comprehensive metabolic panel     Status: Abnormal   Collection Time: 08/03/21  6:10 AM  Result Value Ref Range   Sodium 138 135 - 145 mmol/L   Potassium 3.8 3.5 - 5.1 mmol/L   Chloride 102 98 - 111 mmol/L   CO2 26 22 - 32 mmol/L   Glucose, Bld 94 70 - 99 mg/dL   BUN 13 6 - 20 mg/dL   Creatinine, Ser 0.59 0.44 - 1.00 mg/dL   Calcium 9.1 8.9 - 10.3 mg/dL   Total Protein 7.0 6.5 - 8.1 g/dL   Albumin 3.7 3.5 - 5.0 g/dL   AST 13 (L) 15 - 41 U/L   ALT 10 0 - 44 U/L   Alkaline Phosphatase 52 38 - 126 U/L   Total Bilirubin 0.8 0.3 - 1.2 mg/dL   GFR, Estimated >60 >60 mL/min   Anion gap 10 5 - 15  CBC WITH DIFFERENTIAL     Status: None   Collection Time: 08/03/21  6:10 AM  Result Value Ref Range   WBC 5.5 4.0 - 10.5 K/uL   RBC 4.21 3.87 - 5.11 MIL/uL   Hemoglobin 12.1 12.0 - 15.0 g/dL   HCT 36.8 36.0 - 46.0 %   MCV 87.4 80.0 - 100.0 fL   MCH 28.7 26.0 - 34.0 pg   MCHC 32.9 30.0 - 36.0 g/dL   RDW 13.2 11.5 - 15.5 %   Platelets 234 150 - 400 K/uL   nRBC 0.0 0.0 - 0.2 %   Neutrophils Relative % 49 %   Neutro Abs 2.7 1.7 - 7.7 K/uL   Lymphocytes Relative 42 %   Lymphs Abs 2.3 0.7 - 4.0 K/uL   Monocytes Relative 6 %   Monocytes Absolute 0.3 0.1 - 1.0 K/uL   Eosinophils Relative 3 %   Eosinophils Absolute 0.2 0.0 - 0.5 K/uL   Basophils Relative 0 %   Basophils Absolute 0.0 0.0 - 0.1 K/uL   Immature Granulocytes 0 %   Abs Immature Granulocytes 0.01 0.00 - 0.07 K/uL  Lipid panel     Status: Abnormal   Collection Time: 08/03/21  6:10 AM  Result Value Ref Range   Cholesterol 263 (H) 0 - 200 mg/dL   Triglycerides 122 <150 mg/dL   HDL 39 (L) >40 mg/dL   Total CHOL/HDL Ratio 6.7 RATIO   VLDL 24 0 - 40 mg/dL   LDL Cholesterol 200 (H) 0 - 99 mg/dL   DG Chest 2 View  Result Date: 08/02/2021 CLINICAL DATA:  Chest pain EXAM: CHEST - 2 VIEW COMPARISON:  04/16/2017 FINDINGS: Midline trachea. Normal heart size and mediastinal  contours. No pleural effusion or pneumothorax. Clear lungs. IMPRESSION: No acute cardiopulmonary disease. Electronically Signed   By: Abigail Miyamoto M.D.   On: 08/02/2021 14:58   CT Angio Chest PE W and/or Wo Contrast  Result Date: 08/02/2021 CLINICAL DATA:  Rule out acute pulmonary embolus. Positive D-dimer. Chest pain for 1 week. Shortness of breath with nausea and diaphoresis. EXAM: CT ANGIOGRAPHY CHEST WITH CONTRAST TECHNIQUE: Multidetector CT imaging of the chest was performed using the standard protocol during bolus administration of intravenous contrast. Multiplanar CT image reconstructions and MIPs were obtained to evaluate the vascular anatomy. CONTRAST:  80m OMNIPAQUE IOHEXOL 350 MG/ML SOLN COMPARISON:  None. FINDINGS: Cardiovascular: Satisfactory opacification of the pulmonary arteries to the segmental level. No evidence of pulmonary  embolism. Normal heart size. No pericardial effusion. Mild aortic atherosclerosis and coronary artery calcifications. Mediastinum/Nodes: No enlarged mediastinal, hilar, or axillary lymph nodes. Thyroid gland, trachea, and esophagus demonstrate no significant findings. Lungs/Pleura: No pleural effusion, airspace consolidation, or pneumothorax. Mild interlobular septal thickening with ground-glass attenuation is noted with a lower lung zone predominance. Upper Abdomen: No acute abnormality. Left lobe of liver cyst measures 1.4 cm. Cholecystectomy. Musculoskeletal: No chest wall abnormality. No acute or significant osseous findings. Review of the MIP images confirms the above findings. IMPRESSION: 1. No evidence for acute pulmonary embolus. 2. Mild interlobular septal thickening with ground-glass attenuation is noted with a lower lung zone predominance. Findings are nonspecific and may reflect early pulmonary edema. 3. Aortic atherosclerosis and coronary artery calcifications. Aortic Atherosclerosis (ICD10-I70.0). Electronically Signed   By: Kerby Moors M.D.   On:  08/02/2021 18:47     ASSESSMENT AND PLAN: Patient currently asymptomatic. Negative EKG, normal troponins. Patient can be discharged with a follow up in office tomorrow to schedule an outpatient nuclear stress test.   Luetta Nutting Dietra Stokely FNP-C

## 2021-08-03 NOTE — ED Notes (Signed)
Pt ambulatory to bathroom without difficulty. Pt denies any further needs at this time, lights dimmed for comfort.

## 2021-08-03 NOTE — ED Notes (Signed)
Pt sleeping. 

## 2021-08-03 NOTE — Discharge Summary (Signed)
Physician Discharge Summary  Patient ID: Dana Hensley MRN: [id number] DOB/AGE: 12/07/1960 60 y.o.  Admit date: 08/02/2021 Discharge date: 08/03/2021  Admission Diagnoses:  Discharge Diagnoses:  Principal Problem:   Other chest pain Active Problems:   Dyslipidemia   Acquired hypothyroidism   Chest pain   Discharged Condition: good  Hospital Course:  Patient is a 60 year old female with history of peripheral vascular disease presents to the hospital complaining of chest pain. She had previously had a similar chest pain in the past, was seen by chiropractor.  Her son recently got a COVID, she was stressed out, she started have chest pain.  Chest pain localized in left upper chest, intermittent, each time lasted a few minutes.  Her troponin was negative, CT angiogram chest did not show a PE. She has been seeing by cardiology, unlikely to be cardiac in origin.  Patient will be followed with cardiology in 1 week after discharge. Lab work showed LDL of 200, will start Lipitor.  Prescription was sent separately from the medication listed below    Consults: cardiology  Significant Diagnostic Studies:  CT ANGIOGRAPHY CHEST WITH CONTRAST   TECHNIQUE: Multidetector CT imaging of the chest was performed using the standard protocol during bolus administration of intravenous contrast. Multiplanar CT image reconstructions and MIPs were obtained to evaluate the vascular anatomy.   CONTRAST:  4m OMNIPAQUE IOHEXOL 350 MG/ML SOLN   COMPARISON:  None.   FINDINGS: Cardiovascular: Satisfactory opacification of the pulmonary arteries to the segmental level. No evidence of pulmonary embolism.   Normal heart size. No pericardial effusion. Mild aortic atherosclerosis and coronary artery calcifications.   Mediastinum/Nodes: No enlarged mediastinal, hilar, or axillary lymph nodes. Thyroid gland, trachea, and esophagus demonstrate no significant findings.   Lungs/Pleura: No pleural effusion,  airspace consolidation, or pneumothorax. Mild interlobular septal thickening with ground-glass attenuation is noted with a lower lung zone predominance.   Upper Abdomen: No acute abnormality. Left lobe of liver cyst measures 1.4 cm. Cholecystectomy.   Musculoskeletal: No chest wall abnormality. No acute or significant osseous findings.   Review of the MIP images confirms the above findings.   IMPRESSION: 1. No evidence for acute pulmonary embolus. 2. Mild interlobular septal thickening with ground-glass attenuation is noted with a lower lung zone predominance. Findings are nonspecific and may reflect early pulmonary edema. 3. Aortic atherosclerosis and coronary artery calcifications.   Aortic Atherosclerosis (ICD10-I70.0).     Electronically Signed   By: TKerby MoorsM.D.   On: 08/02/2021 18:47    Treatments: Tele  Discharge Exam: Blood pressure 120/77, pulse (!) 56, temperature 97.9 F (36.6 C), temperature source Oral, resp. rate 16, height '5\' 5"'$  (1.651 m), weight 80.7 kg, SpO2 96 %. General appearance: alert and cooperative Resp: clear to auscultation bilaterally Cardio: regular rate and rhythm, S1, S2 normal, no murmur, click, rub or gallop GI: soft, non-tender; bowel sounds normal; no masses,  no organomegaly Extremities: extremities normal, atraumatic, no cyanosis or edema  Disposition: Discharge disposition: 01-Home or Self Care       Discharge Instructions     Diet - low sodium heart healthy   Complete by: As directed    Increase activity slowly   Complete by: As directed       Allergies as of 08/03/2021       Reactions   Latex Swelling   SWELLING AROUND FACE SWELLING AROUND FACE   Neomycin Other (See Comments), Rash   Seen from allergy skin test Seen from allergy skin test  Neosporin  [neomycin-bacitracin Zn-polymyx] Other (See Comments)   Seen from allergy skin test   Neomycin-polymyxin-gramicidin    Other reaction(s): Unknown Seen from  allergy skin test   Other Other (See Comments)   THIMERSOL. THIMERSOL- seen from allergy skin test Other reaction(s): Unknown THIMERSOL. THIMERSOL- seen from allergy skin test        Medication List     TAKE these medications    augmented betamethasone dipropionate 0.05 % ointment Commonly known as: DIPROLENE-AF Apply topically 2 (two) times daily as needed.   cetirizine 10 MG tablet Commonly known as: ZYRTEC Take 10 mg by mouth daily.   clobetasol ointment 0.05 % Commonly known as: TEMOVATE Apply 1 application topically as needed.   Cyanocobalamin 1000 MCG Tbcr Take by mouth.   B-12 COMPLIANCE INJECTION IJ Inject as directed every 30 (thirty) days.   cyanocobalamin 1000 MCG/ML injection Commonly known as: (VITAMIN B-12) Inject 1,000 mcg into the muscle every 30 (thirty) days.   dicyclomine 10 MG capsule Commonly known as: BENTYL TAKE 1 CAPSULE (10 MG TOTAL) BY MOUTH 4 (FOUR) TIMES DAILY - BEFORE MEALS AND AT BEDTIME.   EPINEPHrine 0.3 mg/0.3 mL Soaj injection Commonly known as: EPI-PEN Inject into the muscle.   ferrous sulfate 325 (65 FE) MG tablet Take 325 mg by mouth daily with breakfast.   levothyroxine 75 MCG tablet Commonly known as: SYNTHROID Take 1 tablet (75 mcg total) by mouth daily.   mometasone 0.1 % ointment Commonly known as: ELOCON Apply 1 application topically as needed.   Multi-Vitamins Tabs Take 1 tablet by mouth daily.   pimecrolimus 1 % cream Commonly known as: ELIDEL Apply topically.   polyethylene glycol-electrolytes 420 g solution Commonly known as: GaviLyte-N with Flavor Pack Drink one 8 oz glass every 20 mins until entire container is finished starting at 5:00pm on 04/20/21   Premarin vaginal cream Generic drug: conjugated estrogens Place 1 Applicatorful vaginally daily.   vitamin C 1000 MG tablet Take 1,000 mg by mouth daily.   ZINC PO Take by mouth.        Follow-up Information     Gauger, [name], NP  Follow up in 1 week(s).   Specialty: Internal Medicine Contact information: [place] [address] [phone]         Dionisio David, MD Follow up in 1 week(s).   Specialty: Cardiology Contact information: [address] [place] [address] [phone]                 Signed: Sharen Hones 08/03/2021, 1:02 PM

## 2021-08-04 DIAGNOSIS — R079 Chest pain, unspecified: Secondary | ICD-10-CM | POA: Diagnosis not present

## 2021-08-04 DIAGNOSIS — R9431 Abnormal electrocardiogram [ECG] [EKG]: Secondary | ICD-10-CM | POA: Diagnosis not present

## 2021-08-04 DIAGNOSIS — L119 Acantholytic disorder, unspecified: Secondary | ICD-10-CM | POA: Diagnosis not present

## 2021-08-04 DIAGNOSIS — E782 Mixed hyperlipidemia: Secondary | ICD-10-CM | POA: Diagnosis not present

## 2021-08-04 DIAGNOSIS — E039 Hypothyroidism, unspecified: Secondary | ICD-10-CM | POA: Diagnosis not present

## 2021-08-04 DIAGNOSIS — D3701 Neoplasm of uncertain behavior of lip: Secondary | ICD-10-CM | POA: Diagnosis not present

## 2021-08-05 DIAGNOSIS — R079 Chest pain, unspecified: Secondary | ICD-10-CM | POA: Diagnosis not present

## 2021-08-09 DIAGNOSIS — M7918 Myalgia, other site: Secondary | ICD-10-CM | POA: Diagnosis not present

## 2021-08-09 DIAGNOSIS — M9903 Segmental and somatic dysfunction of lumbar region: Secondary | ICD-10-CM | POA: Diagnosis not present

## 2021-08-09 DIAGNOSIS — M9904 Segmental and somatic dysfunction of sacral region: Secondary | ICD-10-CM | POA: Diagnosis not present

## 2021-08-11 DIAGNOSIS — M9904 Segmental and somatic dysfunction of sacral region: Secondary | ICD-10-CM | POA: Diagnosis not present

## 2021-08-11 DIAGNOSIS — M7918 Myalgia, other site: Secondary | ICD-10-CM | POA: Diagnosis not present

## 2021-08-11 DIAGNOSIS — M9903 Segmental and somatic dysfunction of lumbar region: Secondary | ICD-10-CM | POA: Diagnosis not present

## 2021-08-22 DIAGNOSIS — M9904 Segmental and somatic dysfunction of sacral region: Secondary | ICD-10-CM | POA: Diagnosis not present

## 2021-08-22 DIAGNOSIS — M7918 Myalgia, other site: Secondary | ICD-10-CM | POA: Diagnosis not present

## 2021-08-22 DIAGNOSIS — M9903 Segmental and somatic dysfunction of lumbar region: Secondary | ICD-10-CM | POA: Diagnosis not present

## 2021-08-24 DIAGNOSIS — R079 Chest pain, unspecified: Secondary | ICD-10-CM | POA: Diagnosis not present

## 2021-08-25 DIAGNOSIS — R0602 Shortness of breath: Secondary | ICD-10-CM | POA: Diagnosis not present

## 2021-08-25 DIAGNOSIS — R079 Chest pain, unspecified: Secondary | ICD-10-CM | POA: Diagnosis not present

## 2021-08-25 DIAGNOSIS — I10 Essential (primary) hypertension: Secondary | ICD-10-CM | POA: Diagnosis not present

## 2021-08-25 DIAGNOSIS — E782 Mixed hyperlipidemia: Secondary | ICD-10-CM | POA: Diagnosis not present

## 2021-08-31 ENCOUNTER — Other Ambulatory Visit: Payer: Self-pay

## 2021-08-31 ENCOUNTER — Encounter: Payer: Self-pay | Admitting: Obstetrics and Gynecology

## 2021-08-31 ENCOUNTER — Ambulatory Visit: Payer: BC Managed Care – PPO | Admitting: Obstetrics and Gynecology

## 2021-08-31 VITALS — BP 118/90 | Ht 65.0 in | Wt 174.0 lb

## 2021-08-31 DIAGNOSIS — N6322 Unspecified lump in the left breast, upper inner quadrant: Secondary | ICD-10-CM

## 2021-08-31 NOTE — Progress Notes (Signed)
Gauger, [name], NP   Chief Complaint  Patient presents with   Breast Exam    Lump on LB, painful x 3 weeks    HPI:      [name] is a 60 y.o. G1P1001 whose LMP was No LMP recorded. Patient has had a hysterectomy., presents today for LT breast/chest wall mass for several wks. Sx started with LT chest pain/LT arm pain and SOB. Pt was massaging heart area due to chest pain and noticed deep, tender mass on chest wall/breast. Pt was seen in ED and PE ruled out, but labs suggestive of clots, per pt. Hx of DVT in past. Has had echo, a 2nd CT scan and xray. Has CT scheduled tomorrow. Chest wall/breast mass is smaller and less painful now, but pt can still feel it. She wants to make sure it's not cause of her chest/arm pain. Neg mammo 10/27/20. Drinks a lot of caffeine. No erythema/trauma/nipple d/c. No FH breast/ovar cancer  Past Medical History:  Diagnosis Date   Allergy    Anxiety    Asthma    B12 deficiency    Barrett esophagus    Chronic atrophic gastritis    DVT, lower extremity (HCC)    Gastric polyps    HLD (hyperlipidemia)    Hypothyroidism    IGT (impaired glucose tolerance)    Neuroendocrine tumor    carcinoid tumors of the stomach, s/p extraction surgery at [facility]   PONV (postoperative nausea and vomiting)    Vitamin D deficiency     Past Surgical History:  Procedure Laterality Date   ABDOMINAL HYSTERECTOMY     APPENDECTOMY     CHOLECYSTECTOMY N/A 11/16/2016   Procedure: LAPAROSCOPIC CHOLECYSTECTOMY WITH INTRAOPERATIVE CHOLANGIOGRAM;  Surgeon: Leonie Green, MD;  Location: [facility] ORS;  Service: General;  Laterality: N/A;   COLONOSCOPY WITH PROPOFOL N/A 04/21/2021   Procedure: COLONOSCOPY WITH PROPOFOL;  Surgeon: [name], MD;  Location: [facility] ENDOSCOPY;  Service: Endoscopy;  Laterality: N/A;   DIAGNOSTIC LAPAROSCOPY     ESOPHAGOGASTRODUODENOSCOPY     TONSILLECTOMY     VEIN LIGATION AND STRIPPING      Family History  Problem Relation Age of  Onset   Clotting disorder Mother    Hyperlipidemia Father    Breast cancer Neg Hx     Social History   Socioeconomic History   Marital status: Married    Spouse name: Not on file   Number of children: Not on file   Years of education: Not on file   Highest education level: Not on file  Occupational History   Not on file  Tobacco Use   Smoking status: Never   Smokeless tobacco: Never  Vaping Use   Vaping Use: Never used  Substance and Sexual Activity   Alcohol use: No    Alcohol/week: 0.0 standard drinks   Drug use: No   Sexual activity: Not Currently    Birth control/protection: None, Surgical    Comment: Hysterectomy  Other Topics Concern   Not on file  Social History Narrative   Not on file   Social Determinants of Health   Financial Resource Strain: Not on file  Food Insecurity: Not on file  Transportation Needs: Not on file  Physical Activity: Not on file  Stress: Not on file  Social Connections: Not on file  Intimate Partner Violence: Not on file    Outpatient Medications Prior to Visit  Medication Sig Dispense Refill   Ascorbic Acid (VITAMIN C) 1000  MG tablet Take 1,000 mg by mouth daily.     atorvastatin (LIPITOR) 20 MG tablet Take 1 tablet (20 mg total) by mouth daily. 30 tablet 0   augmented betamethasone dipropionate (DIPROLENE-AF) 0.05 % ointment Apply topically 2 (two) times daily as needed.     cetirizine (ZYRTEC) 10 MG tablet Take 10 mg by mouth daily.     clobetasol ointment (TEMOVATE) 3.97 % Apply 1 application topically as needed.     cyanocobalamin (,VITAMIN B-12,) 1000 MCG/ML injection Inject 1,000 mcg into the muscle every 30 (thirty) days.     Cyanocobalamin 1000 MCG TBCR Take by mouth.     dicyclomine (BENTYL) 10 MG capsule TAKE 1 CAPSULE (10 MG TOTAL) BY MOUTH 4 (FOUR) TIMES DAILY - BEFORE MEALS AND AT BEDTIME. 120 capsule 6   EPINEPHrine 0.3 mg/0.3 mL IJ SOAJ injection Inject into the muscle.     ferrous sulfate 325 (65 FE) MG tablet Take  325 mg by mouth daily with breakfast.     levothyroxine (SYNTHROID, LEVOTHROID) 75 MCG tablet Take 1 tablet (75 mcg total) by mouth daily. 90 tablet 1   mometasone (ELOCON) 0.1 % ointment Apply 1 application topically as needed.     Multiple Vitamin (MULTI-VITAMINS) TABS Take 1 tablet by mouth daily.      Multiple Vitamins-Minerals (ZINC PO) Take by mouth.     pimecrolimus (ELIDEL) 1 % cream Apply topically.     polyethylene glycol-electrolytes (GAVILYTE-N WITH FLAVOR PACK) 420 g solution Drink one 8 oz glass every 20 mins until entire container is finished starting at 5:00pm on 04/20/21 4000 mL 0   conjugated estrogens (PREMARIN) vaginal cream Place 1 Applicatorful vaginally daily. (Patient not taking: No sig reported) 42.5 g 12   No facility-administered medications prior to visit.      ROS:  Review of Systems  Constitutional:  Negative for fever.  Respiratory:  Positive for shortness of breath.   Cardiovascular:  Positive for chest pain.  Gastrointestinal:  Negative for blood in stool, constipation, diarrhea, nausea and vomiting.  Genitourinary:  Negative for dyspareunia, dysuria, flank pain, frequency, hematuria, urgency, vaginal bleeding, vaginal discharge and vaginal pain.  Musculoskeletal:  Negative for back pain.  Skin:  Negative for rash.  BREAST: mass/pain   OBJECTIVE:   Vitals:  BP 118/90   Ht 5\' 5"  (1.651 m)   Wt 174 lb (78.9 kg)   BMI 28.96 kg/m   Physical Exam Vitals reviewed.  Pulmonary:     Effort: Pulmonary effort is normal.  Chest:  Breasts:    Breasts are symmetrical.     Right: No inverted nipple, mass, nipple discharge, skin change or tenderness.     Left: Mass and tenderness present. No inverted nipple, nipple discharge or skin change.    Musculoskeletal:        General: Normal range of motion.     Cervical back: Normal range of motion.  Skin:    General: Skin is warm and dry.  Neurological:     General: No focal deficit present.     Mental  Status: She is alert and oriented to person, place, and time.     Cranial Nerves: No cranial nerve deficit.  Psychiatric:        Mood and Affect: Mood normal.        Behavior: Behavior normal.        Thought Content: Thought content normal.        Judgment: Judgment normal.    Assessment/Plan: Mass of upper  inner quadrant of left breast - Plan: US BREAST LTD UNI LEFT INC AXILLA, MM DIAG BREAST TOMO UNI LEFT; check dx mammo and u/s. Pt to call [facility] to schedule. Pt wonders if CT will detect mass. If it does, can cancel mammo and u/s. If not, pt has breast imaging scheduled. Will f/u with resuls.     Return if symptoms worsen or fail to improve.  Makinlee Awwad B. [place] Droll, PA-C 08/31/2021 11:52 AM

## 2021-09-01 DIAGNOSIS — R072 Precordial pain: Secondary | ICD-10-CM | POA: Diagnosis not present

## 2021-09-05 ENCOUNTER — Telehealth: Payer: Self-pay | Admitting: Obstetrics and Gynecology

## 2021-09-05 DIAGNOSIS — I10 Essential (primary) hypertension: Secondary | ICD-10-CM | POA: Diagnosis not present

## 2021-09-05 DIAGNOSIS — R079 Chest pain, unspecified: Secondary | ICD-10-CM | POA: Diagnosis not present

## 2021-09-05 DIAGNOSIS — E782 Mixed hyperlipidemia: Secondary | ICD-10-CM | POA: Diagnosis not present

## 2021-09-05 NOTE — Telephone Encounter (Signed)
Patient has question about the mammogram that was ordered. Would like to speak with you. Has procedure scheduled for 09/07/21.   CB# [phone]

## 2021-09-06 NOTE — Telephone Encounter (Signed)
They contacted me and can do both sides, so orders changed for that. That way she doesn't have to go back in 12/22.

## 2021-09-06 NOTE — Telephone Encounter (Signed)
Pt would like to know if she should/can have imaging done on both breasts at tomorrows appt or continue with just one (given her situation). Or should she wait until she's due again?

## 2021-09-06 NOTE — Telephone Encounter (Signed)
Called pt, no answer, LVMTRC. 

## 2021-09-06 NOTE — Telephone Encounter (Signed)
Pt aware.

## 2021-09-07 ENCOUNTER — Other Ambulatory Visit: Payer: Self-pay | Admitting: Obstetrics and Gynecology

## 2021-09-07 ENCOUNTER — Other Ambulatory Visit: Payer: Self-pay

## 2021-09-07 ENCOUNTER — Ambulatory Visit
Admission: RE | Admit: 2021-09-07 | Discharge: 2021-09-07 | Disposition: A | Discharge status: Home / Self Care | Payer: BC Managed Care – PPO | Source: Ambulatory Visit | Attending: Obstetrics and Gynecology | Admitting: Obstetrics and Gynecology

## 2021-09-07 DIAGNOSIS — N644 Mastodynia: Secondary | ICD-10-CM | POA: Diagnosis not present

## 2021-09-07 DIAGNOSIS — N6322 Unspecified lump in the left breast, upper inner quadrant: Secondary | ICD-10-CM

## 2021-09-07 DIAGNOSIS — R922 Inconclusive mammogram: Secondary | ICD-10-CM | POA: Diagnosis not present

## 2021-09-07 IMAGING — MG MM DIGITAL SCREENING BILAT W/ TOMO W/ CAD
8 series · 8 of 24 positions shown · non-contrast
Comparison: Previous exam(s).

CLINICAL DATA: Screening.

EXAM:
DIGITAL SCREENING BILATERAL MAMMOGRAM WITH TOMO AND CAD

[L MLO synth-2D]
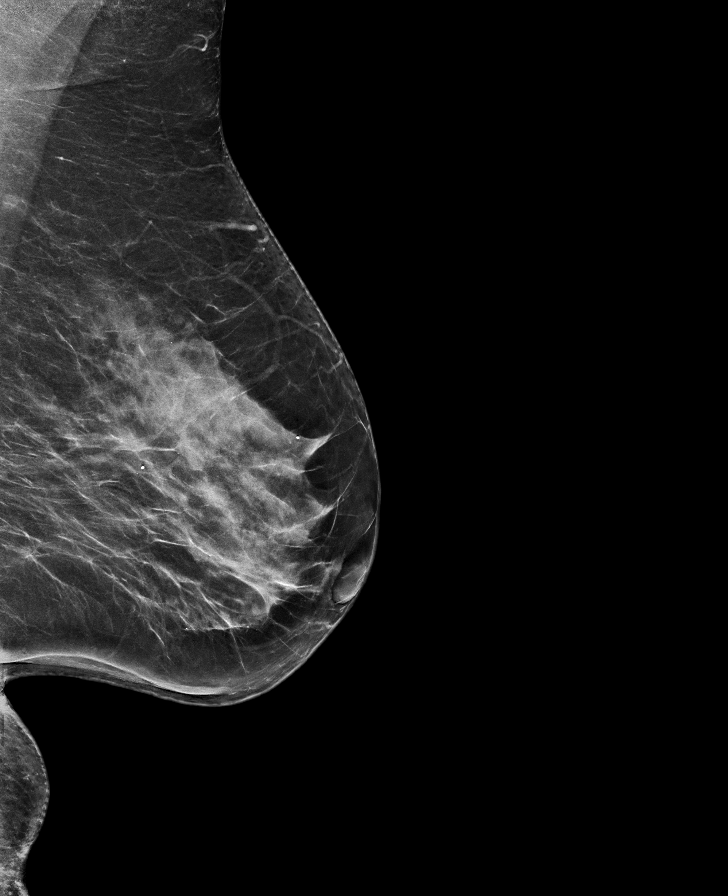

[L CC synth-2D]
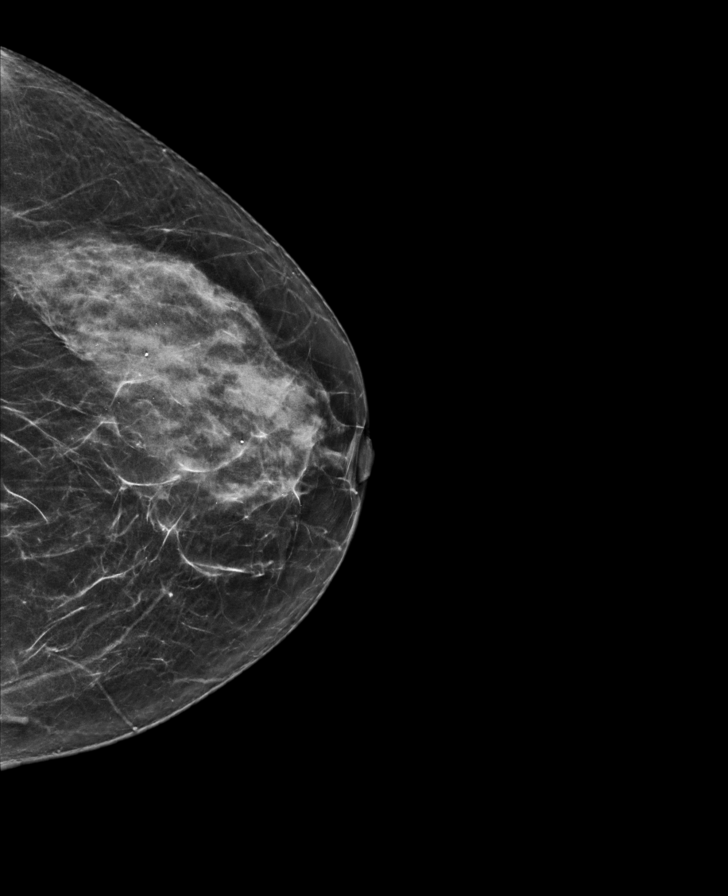

[R MLO synth-2D]
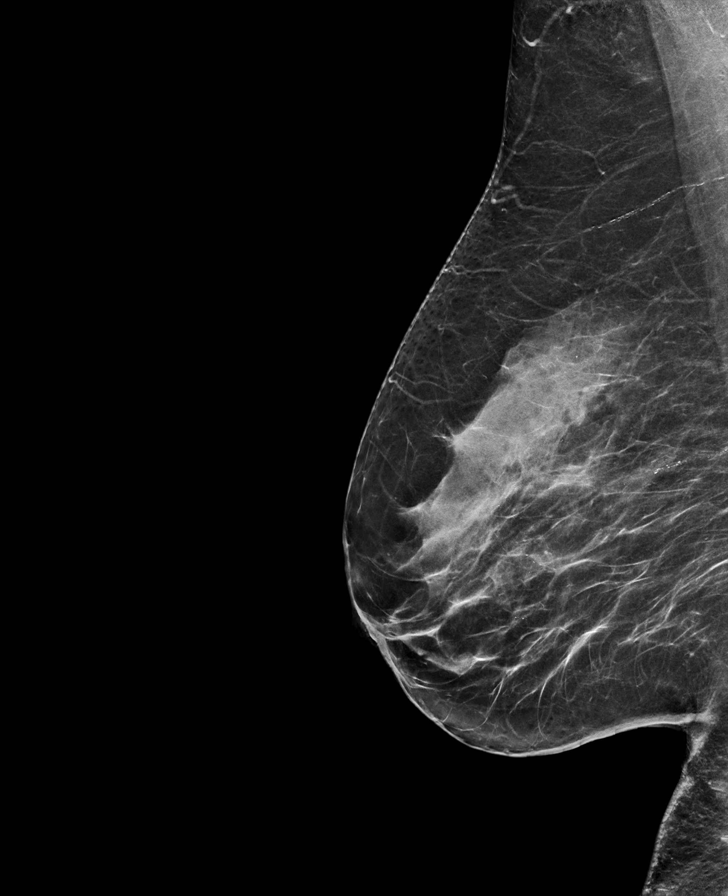

[R CC synth-2D]
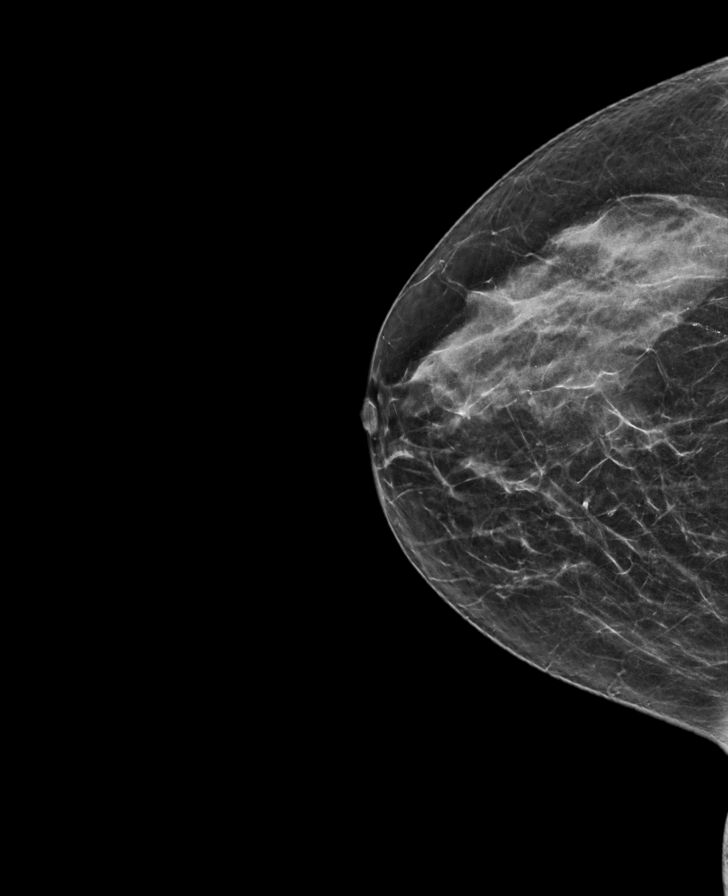

[R MLO tomo · tomo slice 35/68.0]
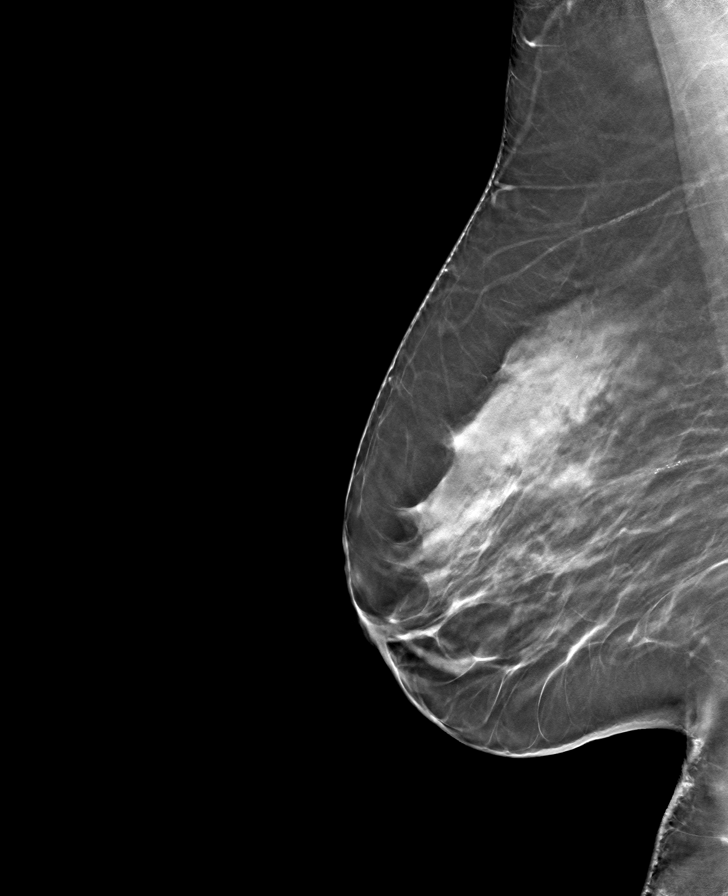

[L CC tomo · tomo slice 31/62.0]
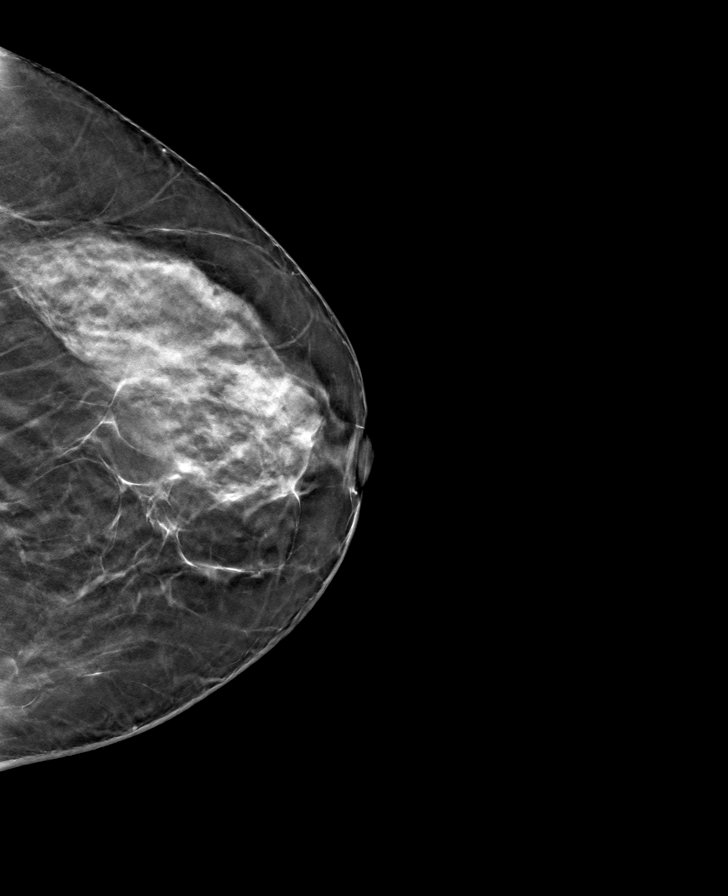

[R CC tomo · tomo slice 31/61.0]
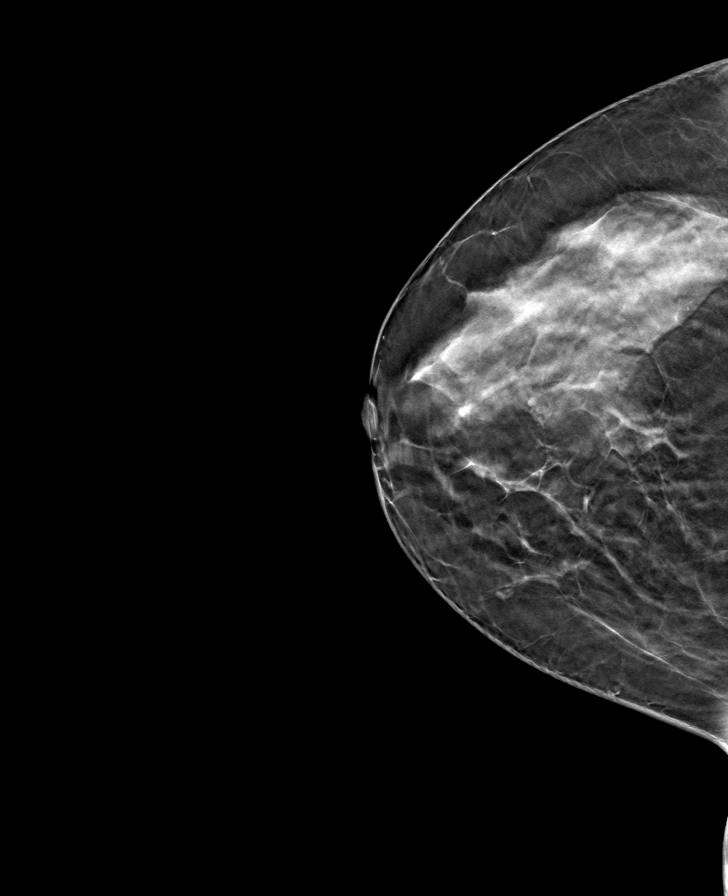

[L MLO tomo · tomo slice 37/72.0]
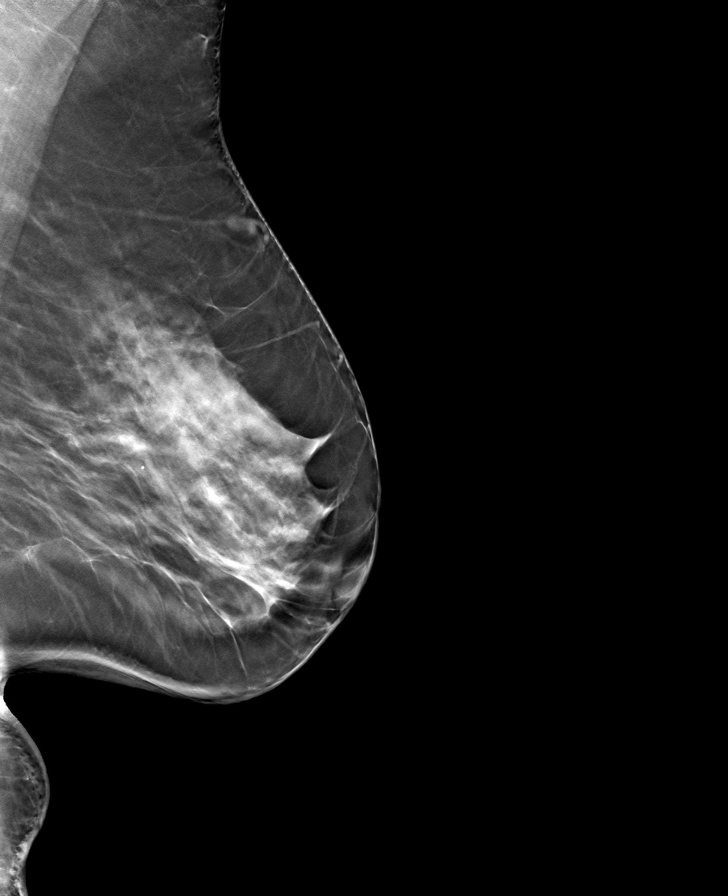

[8 of 24 positions shown; findings below may reference images not displayed]

ACR Breast Density Category c: The breast tissue is heterogeneously
dense, which may obscure small masses.
FINDINGS: There are no findings suspicious for malignancy. Images were
processed with CAD.
IMPRESSION: No mammographic evidence of malignancy. A result letter of this
screening mammogram will be mailed directly to the patient.

RECOMMENDATION:
Screening mammogram in one year. (Code:FT-U-LHB)

BI-RADS CATEGORY  1: Negative.

## 2021-09-15 DIAGNOSIS — E782 Mixed hyperlipidemia: Secondary | ICD-10-CM | POA: Diagnosis not present

## 2021-09-15 DIAGNOSIS — I2510 Atherosclerotic heart disease of native coronary artery without angina pectoris: Secondary | ICD-10-CM | POA: Diagnosis not present

## 2021-09-15 DIAGNOSIS — I700 Atherosclerosis of aorta: Secondary | ICD-10-CM | POA: Diagnosis not present

## 2021-09-15 DIAGNOSIS — Z87898 Personal history of other specified conditions: Secondary | ICD-10-CM | POA: Diagnosis not present

## 2021-09-21 DIAGNOSIS — M9903 Segmental and somatic dysfunction of lumbar region: Secondary | ICD-10-CM | POA: Diagnosis not present

## 2021-09-21 DIAGNOSIS — M9904 Segmental and somatic dysfunction of sacral region: Secondary | ICD-10-CM | POA: Diagnosis not present

## 2021-09-21 DIAGNOSIS — M7918 Myalgia, other site: Secondary | ICD-10-CM | POA: Diagnosis not present

## 2021-10-24 ENCOUNTER — Encounter: Payer: Self-pay | Admitting: Obstetrics and Gynecology

## 2021-10-24 ENCOUNTER — Other Ambulatory Visit: Payer: Self-pay

## 2021-10-24 ENCOUNTER — Ambulatory Visit: Payer: BC Managed Care – PPO | Admitting: Obstetrics and Gynecology

## 2021-10-24 VITALS — BP 118/76 | Ht 65.0 in | Wt 178.0 lb

## 2021-10-24 DIAGNOSIS — M7918 Myalgia, other site: Secondary | ICD-10-CM | POA: Diagnosis not present

## 2021-10-24 DIAGNOSIS — M9903 Segmental and somatic dysfunction of lumbar region: Secondary | ICD-10-CM | POA: Diagnosis not present

## 2021-10-24 DIAGNOSIS — N644 Mastodynia: Secondary | ICD-10-CM | POA: Diagnosis not present

## 2021-10-24 DIAGNOSIS — M9904 Segmental and somatic dysfunction of sacral region: Secondary | ICD-10-CM | POA: Diagnosis not present

## 2021-10-24 NOTE — Progress Notes (Signed)
Dana Hensley, Dana Lain, NP   Chief Complaint  Patient presents with   Follow-up    Breast exam, LB only. Pain is still there, lump gets smaller at times, then returns to size it was    HPI:      [name] is a 60 y.o. G1P1001 whose LMP was No LMP recorded. Patient has had a hysterectomy., presents today for f/u LT breast pain from 10/22. Sx started in Aug with chest pain, SOB, arm pain. Went to ED and had positive D-dimer with neg CT scan for PE, neg troponin. Hx of DVT in past. Pt has since then seen cardio and FP with neg eval. Pt noted tender LT breast/chest wall mass when massaging chest due to pain 8/22. I saw pt 10/22 and could feel a 3x4 cm firm area LT breast without discrete mass. Had neg dx mammo and breast u/s 09/07/21. Since then, the tenderness has decreased but the size of the mass increases and decreases. Pt notes tenderness and mass only present when lying down, not in upright position. Sx worse after sitting at desk all day and more bothersome at night, but better if active on the weekends. Pt also notes improvement after massage of back and flank as well as chiro adjustment. Can feel knots on flank area that she massages herself. Pain sometimes is sharp and radiates to LT upper back. Still drinking caffeine. Pt wonders what could be causing pain.   08/31/21 NOTE: LT breast/chest wall mass for several wks. Sx started with LT chest pain/LT arm pain and SOB. Pt was massaging heart area due to chest pain and noticed deep, tender mass on chest wall/breast. Pt was seen in ED and PE ruled out, but labs suggestive of clots, per pt. Hx of DVT in past. Has had echo, a 2nd CT scan and xray. Has CT scheduled tomorrow. Chest wall/breast mass is smaller and less painful now, but pt can still feel it. She wants to make sure it's not cause of her chest/arm pain. Neg mammo 10/27/20. Drinks a lot of caffeine. No erythema/trauma/nipple d/c. No FH breast/ovar cancer  Patient Active Problem  List   Diagnosis Date Noted   Other chest pain 08/02/2021   Chest pain 08/02/2021   Hx of colonic polyps    Hyperlipidemia, unspecified 08/29/2018   IGT (impaired glucose tolerance) 08/29/2018   Intrinsic atopic dermatitis 08/29/2018   History of adenomatous polyp of colon 04/01/2018   Elevated liver function tests 01/18/2018   Weight loss 01/15/2017   Pain in limb 12/18/2016   Calculus of gallbladder without cholecystitis without obstruction 10/23/2016   Chronic venous insufficiency 10/16/2016   Varicose veins of both lower extremities with pain 10/16/2016   Lymphedema 10/16/2016   Epigastric pain 10/03/2016   Barrett's esophagus determined by endoscopy 01/25/2016   Vitamin B12 deficiency 10/18/2015   Chronic neck and back pain 08/19/2015   Allergic rhinitis with postnasal drip 07/09/2015   Dyslipidemia 05/05/2015   Acquired hypothyroidism 05/05/2015   Carcinoid tumor 05/05/2015   History of malignant carcinoid tumor of stomach 03/26/2012    Past Surgical History:  Procedure Laterality Date   ABDOMINAL HYSTERECTOMY     APPENDECTOMY     CHOLECYSTECTOMY N/A 11/16/2016   Procedure: LAPAROSCOPIC CHOLECYSTECTOMY WITH INTRAOPERATIVE CHOLANGIOGRAM;  Surgeon: Leonie Green, MD;  Location: [facility] ORS;  Service: General;  Laterality: N/A;   COLONOSCOPY WITH PROPOFOL N/A 04/21/2021   Procedure: COLONOSCOPY WITH PROPOFOL;  Surgeon: [name], MD;  Location: [facility]  ENDOSCOPY;  Service: Endoscopy;  Laterality: N/A;   DIAGNOSTIC LAPAROSCOPY     ESOPHAGOGASTRODUODENOSCOPY     TONSILLECTOMY     VEIN LIGATION AND STRIPPING      Family History  Problem Relation Age of Onset   Clotting disorder Mother    Hyperlipidemia Father    Breast cancer Neg Hx     Social History   Socioeconomic History   Marital status: Married    Spouse name: Not on file   Number of children: Not on file   Years of education: Not on file   Highest education level: Not on file  Occupational History    Not on file  Tobacco Use   Smoking status: Never   Smokeless tobacco: Never  Vaping Use   Vaping Use: Never used  Substance and Sexual Activity   Alcohol use: No    Alcohol/week: 0.0 standard drinks   Drug use: No   Sexual activity: Not Currently    Birth control/protection: None, Surgical    Comment: Hysterectomy  Other Topics Concern   Not on file  Social History Narrative   Not on file   Social Determinants of Health   Financial Resource Strain: Not on file  Food Insecurity: Not on file  Transportation Needs: Not on file  Physical Activity: Not on file  Stress: Not on file  Social Connections: Not on file  Intimate Partner Violence: Not on file    Outpatient Medications Prior to Visit  Medication Sig Dispense Refill   Ascorbic Acid (VITAMIN C) 1000 MG tablet Take 1,000 mg by mouth daily.     atorvastatin (LIPITOR) 20 MG tablet Take 1 tablet (20 mg total) by mouth daily. 30 tablet 0   augmented betamethasone dipropionate (DIPROLENE-AF) 0.05 % ointment Apply topically 2 (two) times daily as needed.     cetirizine (ZYRTEC) 10 MG tablet Take 10 mg by mouth daily.     clobetasol ointment (TEMOVATE) 1.88 % Apply 1 application topically as needed.     cyanocobalamin (,VITAMIN B-12,) 1000 MCG/ML injection Inject 1,000 mcg into the muscle every 30 (thirty) days.     Cyanocobalamin 1000 MCG TBCR Take by mouth.     dicyclomine (BENTYL) 10 MG capsule TAKE 1 CAPSULE (10 MG TOTAL) BY MOUTH 4 (FOUR) TIMES DAILY - BEFORE MEALS AND AT BEDTIME. 120 capsule 6   EPINEPHrine 0.3 mg/0.3 mL IJ SOAJ injection Inject into the muscle.     ferrous sulfate 325 (65 FE) MG tablet Take 325 mg by mouth daily with breakfast.     levothyroxine (SYNTHROID, LEVOTHROID) 75 MCG tablet Take 1 tablet (75 mcg total) by mouth daily. 90 tablet 1   mometasone (ELOCON) 0.1 % ointment Apply 1 application topically as needed.     Multiple Vitamin (MULTI-VITAMINS) TABS Take 1 tablet by mouth daily.      Multiple  Vitamins-Minerals (ZINC PO) Take by mouth.     pimecrolimus (ELIDEL) 1 % cream Apply topically.     polyethylene glycol-electrolytes (GAVILYTE-N WITH FLAVOR PACK) 420 g solution Drink one 8 oz glass every 20 mins until entire container is finished starting at 5:00pm on 04/20/21 4000 mL 0   conjugated estrogens (PREMARIN) vaginal cream Place 1 Applicatorful vaginally daily. (Patient not taking: Reported on 08/03/2021) 42.5 g 12   No facility-administered medications prior to visit.      ROS:  Review of Systems  Constitutional:  Negative for fever.  Gastrointestinal:  Negative for blood in stool, constipation, diarrhea, nausea and vomiting.  Genitourinary:  Negative for dyspareunia, dysuria, flank pain, frequency, hematuria, urgency, vaginal bleeding, vaginal discharge and vaginal pain.  Musculoskeletal:  Negative for back pain.  Skin:  Negative for rash.  BREAST: chest pain/mass   OBJECTIVE:   Vitals:  BP 118/76   Ht 5\' 5"  (1.651 m)   Wt 178 lb (80.7 kg)   BMI 29.62 kg/m   Physical Exam Vitals reviewed.  Pulmonary:     Effort: Pulmonary effort is normal.  Chest:  Breasts:    Breasts are symmetrical.     Right: No inverted nipple, mass, nipple discharge, skin change or tenderness.     Left: No inverted nipple, mass, nipple discharge, skin change or tenderness.       Comments: NEG BREAST EXAM Musculoskeletal:        General: Normal range of motion.     Cervical back: Normal range of motion.  Skin:    General: Skin is warm and dry.  Neurological:     General: No focal deficit present.     Mental Status: She is alert and oriented to person, place, and time.     Cranial Nerves: No cranial nerve deficit.  Psychiatric:        Mood and Affect: Mood normal.        Behavior: Behavior normal.        Thought Content: Thought content normal.        Judgment: Judgment normal.    Assessment/Plan: Breast pain, left - Plan: Ambulatory referral to General Surgery; neg breast  exam, neg dx mammo and u/s 10/22. Discussed differential dx of chest pain, including cardio, pulmonary, MSK and breast. Refer to gen surg to confirm no breast etiology causing sx. If neg, most likely MSK. 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 Try heating pad/ice prn.      Return if symptoms worsen or fail to improve.  Lenzi Marmo B. Sohum Delillo, PA-C 10/25/2021 2:10 PM

## 2021-10-25 ENCOUNTER — Encounter: Payer: Self-pay | Admitting: Obstetrics and Gynecology

## 2021-11-10 DIAGNOSIS — R0982 Postnasal drip: Secondary | ICD-10-CM | POA: Diagnosis not present

## 2021-11-10 DIAGNOSIS — J309 Allergic rhinitis, unspecified: Secondary | ICD-10-CM | POA: Diagnosis not present

## 2021-11-10 DIAGNOSIS — J301 Allergic rhinitis due to pollen: Secondary | ICD-10-CM | POA: Diagnosis not present

## 2021-11-15 DIAGNOSIS — Z79899 Other long term (current) drug therapy: Secondary | ICD-10-CM | POA: Diagnosis not present

## 2021-11-15 DIAGNOSIS — E782 Mixed hyperlipidemia: Secondary | ICD-10-CM | POA: Diagnosis not present

## 2021-11-15 DIAGNOSIS — R7302 Impaired glucose tolerance (oral): Secondary | ICD-10-CM | POA: Diagnosis not present

## 2021-11-15 DIAGNOSIS — E538 Deficiency of other specified B group vitamins: Secondary | ICD-10-CM | POA: Diagnosis not present

## 2021-11-15 DIAGNOSIS — I2510 Atherosclerotic heart disease of native coronary artery without angina pectoris: Secondary | ICD-10-CM | POA: Diagnosis not present

## 2021-11-15 DIAGNOSIS — I700 Atherosclerosis of aorta: Secondary | ICD-10-CM | POA: Diagnosis not present

## 2021-11-15 DIAGNOSIS — Z0000 Encounter for general adult medical examination without abnormal findings: Secondary | ICD-10-CM | POA: Diagnosis not present

## 2021-11-15 DIAGNOSIS — R809 Proteinuria, unspecified: Secondary | ICD-10-CM | POA: Diagnosis not present

## 2021-12-02 DIAGNOSIS — J301 Allergic rhinitis due to pollen: Secondary | ICD-10-CM | POA: Diagnosis not present

## 2021-12-05 DIAGNOSIS — J301 Allergic rhinitis due to pollen: Secondary | ICD-10-CM | POA: Diagnosis not present

## 2021-12-08 DIAGNOSIS — J301 Allergic rhinitis due to pollen: Secondary | ICD-10-CM | POA: Diagnosis not present

## 2021-12-12 DIAGNOSIS — J301 Allergic rhinitis due to pollen: Secondary | ICD-10-CM | POA: Diagnosis not present

## 2021-12-15 DIAGNOSIS — J301 Allergic rhinitis due to pollen: Secondary | ICD-10-CM | POA: Diagnosis not present

## 2021-12-19 DIAGNOSIS — J301 Allergic rhinitis due to pollen: Secondary | ICD-10-CM | POA: Diagnosis not present

## 2021-12-21 DIAGNOSIS — R070 Pain in throat: Secondary | ICD-10-CM | POA: Diagnosis not present

## 2021-12-21 DIAGNOSIS — J309 Allergic rhinitis, unspecified: Secondary | ICD-10-CM | POA: Diagnosis not present

## 2021-12-22 DIAGNOSIS — J301 Allergic rhinitis due to pollen: Secondary | ICD-10-CM | POA: Diagnosis not present

## 2021-12-26 DIAGNOSIS — J019 Acute sinusitis, unspecified: Secondary | ICD-10-CM | POA: Diagnosis not present

## 2021-12-26 DIAGNOSIS — R070 Pain in throat: Secondary | ICD-10-CM | POA: Diagnosis not present

## 2021-12-26 DIAGNOSIS — Z20822 Contact with and (suspected) exposure to covid-19: Secondary | ICD-10-CM | POA: Diagnosis not present

## 2021-12-27 ENCOUNTER — Telehealth: Payer: Self-pay

## 2021-12-27 NOTE — Telephone Encounter (Signed)
CALLED PATIENT NO ANSWER LEFT VOICEMAIL FOR A CALL BACK to schedule a in person

## 2021-12-28 ENCOUNTER — Telehealth: Payer: Self-pay

## 2021-12-28 NOTE — Telephone Encounter (Signed)
CALLED PATIENT NO ANSWER LEFT VOICEMAIL FOR A CALL BACK °Letter sent °

## 2021-12-29 DIAGNOSIS — J301 Allergic rhinitis due to pollen: Secondary | ICD-10-CM | POA: Diagnosis not present

## 2022-01-02 DIAGNOSIS — J301 Allergic rhinitis due to pollen: Secondary | ICD-10-CM | POA: Diagnosis not present

## 2022-01-05 DIAGNOSIS — J301 Allergic rhinitis due to pollen: Secondary | ICD-10-CM | POA: Diagnosis not present

## 2022-01-09 DIAGNOSIS — J301 Allergic rhinitis due to pollen: Secondary | ICD-10-CM | POA: Diagnosis not present

## 2022-01-11 DIAGNOSIS — J301 Allergic rhinitis due to pollen: Secondary | ICD-10-CM | POA: Diagnosis not present

## 2022-01-12 DIAGNOSIS — J301 Allergic rhinitis due to pollen: Secondary | ICD-10-CM | POA: Diagnosis not present

## 2022-01-16 DIAGNOSIS — J301 Allergic rhinitis due to pollen: Secondary | ICD-10-CM | POA: Diagnosis not present

## 2022-01-19 DIAGNOSIS — J301 Allergic rhinitis due to pollen: Secondary | ICD-10-CM | POA: Diagnosis not present

## 2022-01-23 DIAGNOSIS — J301 Allergic rhinitis due to pollen: Secondary | ICD-10-CM | POA: Diagnosis not present

## 2022-01-26 DIAGNOSIS — J301 Allergic rhinitis due to pollen: Secondary | ICD-10-CM | POA: Diagnosis not present

## 2022-01-30 DIAGNOSIS — J301 Allergic rhinitis due to pollen: Secondary | ICD-10-CM | POA: Diagnosis not present

## 2022-02-06 DIAGNOSIS — M542 Cervicalgia: Secondary | ICD-10-CM | POA: Diagnosis not present

## 2022-02-06 DIAGNOSIS — J309 Allergic rhinitis, unspecified: Secondary | ICD-10-CM | POA: Diagnosis not present

## 2022-02-06 DIAGNOSIS — J029 Acute pharyngitis, unspecified: Secondary | ICD-10-CM | POA: Diagnosis not present

## 2022-02-06 DIAGNOSIS — H9202 Otalgia, left ear: Secondary | ICD-10-CM | POA: Diagnosis not present

## 2022-02-08 DIAGNOSIS — R070 Pain in throat: Secondary | ICD-10-CM | POA: Diagnosis not present

## 2022-02-08 DIAGNOSIS — K219 Gastro-esophageal reflux disease without esophagitis: Secondary | ICD-10-CM | POA: Diagnosis not present

## 2022-02-08 DIAGNOSIS — R053 Chronic cough: Secondary | ICD-10-CM | POA: Diagnosis not present

## 2022-02-08 DIAGNOSIS — J3089 Other allergic rhinitis: Secondary | ICD-10-CM | POA: Diagnosis not present

## 2022-02-09 DIAGNOSIS — J301 Allergic rhinitis due to pollen: Secondary | ICD-10-CM | POA: Diagnosis not present

## 2022-02-16 DIAGNOSIS — J301 Allergic rhinitis due to pollen: Secondary | ICD-10-CM | POA: Diagnosis not present

## 2022-02-23 DIAGNOSIS — J301 Allergic rhinitis due to pollen: Secondary | ICD-10-CM | POA: Diagnosis not present

## 2022-03-02 DIAGNOSIS — J301 Allergic rhinitis due to pollen: Secondary | ICD-10-CM | POA: Diagnosis not present

## 2022-03-09 DIAGNOSIS — J301 Allergic rhinitis due to pollen: Secondary | ICD-10-CM | POA: Diagnosis not present

## 2022-03-16 DIAGNOSIS — M7918 Myalgia, other site: Secondary | ICD-10-CM | POA: Diagnosis not present

## 2022-03-16 DIAGNOSIS — R222 Localized swelling, mass and lump, trunk: Secondary | ICD-10-CM | POA: Diagnosis not present

## 2022-03-16 DIAGNOSIS — J301 Allergic rhinitis due to pollen: Secondary | ICD-10-CM | POA: Diagnosis not present

## 2022-03-16 DIAGNOSIS — M9903 Segmental and somatic dysfunction of lumbar region: Secondary | ICD-10-CM | POA: Diagnosis not present

## 2022-03-16 DIAGNOSIS — M9904 Segmental and somatic dysfunction of sacral region: Secondary | ICD-10-CM | POA: Diagnosis not present

## 2022-03-23 DIAGNOSIS — J301 Allergic rhinitis due to pollen: Secondary | ICD-10-CM | POA: Diagnosis not present

## 2022-03-30 DIAGNOSIS — J301 Allergic rhinitis due to pollen: Secondary | ICD-10-CM | POA: Diagnosis not present

## 2022-04-05 DIAGNOSIS — J301 Allergic rhinitis due to pollen: Secondary | ICD-10-CM | POA: Diagnosis not present

## 2022-04-06 ENCOUNTER — Telehealth: Payer: Self-pay | Admitting: Gastroenterology

## 2022-04-06 DIAGNOSIS — T182XXA Foreign body in stomach, initial encounter: Secondary | ICD-10-CM | POA: Diagnosis not present

## 2022-04-06 DIAGNOSIS — Z90722 Acquired absence of ovaries, bilateral: Secondary | ICD-10-CM | POA: Diagnosis not present

## 2022-04-06 DIAGNOSIS — Z86718 Personal history of other venous thrombosis and embolism: Secondary | ICD-10-CM | POA: Diagnosis not present

## 2022-04-06 DIAGNOSIS — Z86012 Personal history of benign carcinoid tumor: Secondary | ICD-10-CM | POA: Diagnosis not present

## 2022-04-06 DIAGNOSIS — K3189 Other diseases of stomach and duodenum: Secondary | ICD-10-CM | POA: Diagnosis not present

## 2022-04-06 DIAGNOSIS — K31A15 Gastric intestinal metaplasia without dysplasia, involving multiple sites: Secondary | ICD-10-CM | POA: Diagnosis not present

## 2022-04-06 DIAGNOSIS — E785 Hyperlipidemia, unspecified: Secondary | ICD-10-CM | POA: Diagnosis not present

## 2022-04-06 DIAGNOSIS — K2950 Unspecified chronic gastritis without bleeding: Secondary | ICD-10-CM | POA: Diagnosis not present

## 2022-04-06 DIAGNOSIS — Z09 Encounter for follow-up examination after completed treatment for conditions other than malignant neoplasm: Secondary | ICD-10-CM | POA: Diagnosis not present

## 2022-04-06 DIAGNOSIS — E039 Hypothyroidism, unspecified: Secondary | ICD-10-CM | POA: Diagnosis not present

## 2022-04-06 DIAGNOSIS — Z79899 Other long term (current) drug therapy: Secondary | ICD-10-CM | POA: Diagnosis not present

## 2022-04-06 DIAGNOSIS — D3A092 Benign carcinoid tumor of the stomach: Secondary | ICD-10-CM | POA: Diagnosis not present

## 2022-04-06 DIAGNOSIS — K31A0 Gastric intestinal metaplasia, unspecified: Secondary | ICD-10-CM | POA: Diagnosis not present

## 2022-04-06 DIAGNOSIS — Z90710 Acquired absence of both cervix and uterus: Secondary | ICD-10-CM | POA: Diagnosis not present

## 2022-04-06 DIAGNOSIS — Z9049 Acquired absence of other specified parts of digestive tract: Secondary | ICD-10-CM | POA: Diagnosis not present

## 2022-04-06 DIAGNOSIS — K219 Gastro-esophageal reflux disease without esophagitis: Secondary | ICD-10-CM | POA: Diagnosis not present

## 2022-04-06 DIAGNOSIS — K449 Diaphragmatic hernia without obstruction or gangrene: Secondary | ICD-10-CM | POA: Diagnosis not present

## 2022-04-06 NOTE — Telephone Encounter (Signed)
Patient called requesting to make an appointment. I scheduled an appointment and later realized she is seeing another gastro dr. Patient is requesting to speak to Dr Allen Norris or his nurse.  ?

## 2022-04-06 NOTE — Telephone Encounter (Signed)
Pt is aware that there are no openings sooner at this time but has been placed on the waiting list ?

## 2022-04-12 ENCOUNTER — Encounter: Payer: Self-pay | Admitting: Gastroenterology

## 2022-04-12 ENCOUNTER — Ambulatory Visit (INDEPENDENT_AMBULATORY_CARE_PROVIDER_SITE_OTHER): Payer: BC Managed Care – PPO | Admitting: Gastroenterology

## 2022-04-12 VITALS — BP 117/77 | HR 72 | Temp 98.1°F | Wt 183.0 lb

## 2022-04-12 DIAGNOSIS — K219 Gastro-esophageal reflux disease without esophagitis: Secondary | ICD-10-CM | POA: Diagnosis not present

## 2022-04-12 MED ORDER — ESOMEPRAZOLE MAGNESIUM 20 MG PO CPDR: 20.0000 mg | DELAYED_RELEASE_CAPSULE | Freq: Every day | ORAL | 3 refills | Status: DC

## 2022-04-12 NOTE — Progress Notes (Signed)
? ? ?Primary Care Physician: Sallee Lange, NP ? ?Primary Gastroenterologist:  Dr. [name] ? ?Chief Complaint  ?Patient presents with  ? Cough  ?  Pt reports constant cough, worse at night  ? ? ?HPI: Dana Hensley is a 61 y.o. female here with a history of having a neuroendocrine tumor that is being followed by [facility].  The patient is now being sent to me due to dysphagia.  The patient also has a history of Barrett's esophagus.  The patient had an upper endoscopy 6 days ago at [facility] that reported the esophagus to be normal with a clip found in the stomach and a recommendation for a repeat upper endoscopy in 1 year. ?The patient reports that she is having a lot of postnasal drip and she has to sit up in a chair to sleep at night because she has a lot of coughing.  When she is sitting in her recliner all night long she does not cough as much.  The patient had asked the [facility] gastroenterologist whether she should start a PPI and he referred her back to me today.  The patient did not have any abnormalities except for gastric death and metaplasia found on the EGD. ? ? ?Past Medical History:  ?Diagnosis Date  ? Allergy   ? Anxiety   ? Asthma   ? B12 deficiency   ? Barrett esophagus   ? Chronic atrophic gastritis   ? DVT, lower extremity ([place])   ? Gastric polyps   ? HLD (hyperlipidemia)   ? Hypothyroidism   ? IGT (impaired glucose tolerance)   ? Neuroendocrine tumor   ? carcinoid tumors of the stomach, s/p extraction surgery at [facility]  ? PONV (postoperative nausea and vomiting)   ? Vitamin D deficiency   ? ? ?Current Outpatient Medications  ?Medication Sig Dispense Refill  ? Ascorbic Acid (VITAMIN C) 1000 MG tablet Take 1,000 mg by mouth daily.    ? atorvastatin (LIPITOR) 20 MG tablet Take 1 tablet (20 mg total) by mouth daily. 30 tablet 0  ? augmented betamethasone dipropionate (DIPROLENE-AF) 0.05 % ointment Apply topically 2 (two) times daily as needed.    ? cetirizine (ZYRTEC) 10 MG tablet  Take 10 mg by mouth daily.    ? clobetasol ointment (TEMOVATE) 7.34 % Apply 1 application topically as needed.    ? conjugated estrogens (PREMARIN) vaginal cream Place 1 Applicatorful vaginally daily. 42.5 g 12  ? cyanocobalamin (,VITAMIN B-12,) 1000 MCG/ML injection Inject 1,000 mcg into the muscle every 30 (thirty) days.    ? Cyanocobalamin 1000 MCG TBCR Take by mouth.    ? dicyclomine (BENTYL) 10 MG capsule TAKE 1 CAPSULE (10 MG TOTAL) BY MOUTH 4 (FOUR) TIMES DAILY - BEFORE MEALS AND AT BEDTIME. 120 capsule 6  ? EPINEPHrine 0.3 mg/0.3 mL IJ SOAJ injection Inject into the muscle.    ? ferrous sulfate 325 (65 FE) MG tablet Take 325 mg by mouth daily with breakfast.    ? levothyroxine (SYNTHROID, LEVOTHROID) 75 MCG tablet Take 1 tablet (75 mcg total) by mouth daily. 90 tablet 1  ? mometasone (ELOCON) 0.1 % ointment Apply 1 application topically as needed.    ? Multiple Vitamin (MULTI-VITAMINS) TABS Take 1 tablet by mouth daily.     ? Multiple Vitamins-Minerals (ZINC PO) Take by mouth.    ? pimecrolimus (ELIDEL) 1 % cream Apply topically.    ? polyethylene glycol-electrolytes (GAVILYTE-N WITH FLAVOR PACK) 420 g solution Drink one 8 oz  glass every 20 mins until entire container is finished starting at 5:00pm on 04/20/21 4000 mL 0  ? ?No current facility-administered medications for this visit.  ? ? ?Allergies as of 04/12/2022 - Review Complete 04/12/2022  ?Allergen Reaction Noted  ? Latex Swelling 07/09/2015  ? Neomycin Other (See Comments) and Rash 07/09/2015  ? Neosporin  [neomycin-bacitracin zn-polymyx] Other (See Comments) 07/09/2015  ? Neomycin-polymyxin-gramicidin  07/09/2015  ? Other Other (See Comments) 07/09/2015  ? ? ?ROS: ? ?General: Negative for anorexia, weight loss, fever, chills, fatigue, weakness. ?ENT: Negative for hoarseness, difficulty swallowing , nasal congestion. ?CV: Negative for chest pain, angina, palpitations, dyspnea on exertion, peripheral edema.  ?Respiratory: Negative for dyspnea at rest,  dyspnea on exertion, cough, sputum, wheezing.  ?GI: See history of present illness. ?GU:  Negative for dysuria, hematuria, urinary incontinence, urinary frequency, nocturnal urination.  ?Endo: Negative for unusual weight change.  ?  ?Physical Examination: ? ? BP 117/77   Pulse 72   Temp 98.1 ?F (36.7 ?C) (Oral)   Wt 183 lb (83 kg)   BMI 30.45 kg/m?  ? ?General: Well-nourished, well-developed in no acute distress.  ?Eyes: No icterus. Conjunctivae pink. ?Neuro: Alert and orientated x3 ?Skin: Warm and dry, no jaundice.   ?Psych: Alert and cooperative, normal mood and affect. ? ?Labs:  ?  ?Imaging Studies: ?No results found. ? ?Assessment and Plan:  ? ?Dana Hensley is a 61 y.o. y/o female who comes in today with a history of gastrointestinal metaplasia and follows with [facility] for yearly upper endoscopies.  The patient also has a report of a lot of postnasal drip with coughing at night when laying down.  The patient will be started back on her Nexium 20 mg a day and has been told that if that does not help we can go up to 40 mg a day and see if that resolves her symptoms.  If it does not then it is likely more caused by postnasal drip that it is by reflux.  The patient has been explained the plan agrees with it. ? ? ? ? ?[name], MD. Marval Regal ? ? ? Note: This dictation was prepared with Dragon dictation along with smaller phrase technology. Any transcriptional errors that result from this process are unintentional.  ?

## 2022-04-13 ENCOUNTER — Other Ambulatory Visit: Payer: Self-pay | Admitting: General Surgery

## 2022-04-13 DIAGNOSIS — R222 Localized swelling, mass and lump, trunk: Secondary | ICD-10-CM | POA: Diagnosis not present

## 2022-04-13 DIAGNOSIS — N6322 Unspecified lump in the left breast, upper inner quadrant: Secondary | ICD-10-CM | POA: Diagnosis not present

## 2022-04-14 LAB — SURGICAL PATHOLOGY

## 2022-04-20 DIAGNOSIS — J301 Allergic rhinitis due to pollen: Secondary | ICD-10-CM | POA: Diagnosis not present

## 2022-04-27 DIAGNOSIS — J301 Allergic rhinitis due to pollen: Secondary | ICD-10-CM | POA: Diagnosis not present

## 2022-05-11 DIAGNOSIS — J301 Allergic rhinitis due to pollen: Secondary | ICD-10-CM | POA: Diagnosis not present

## 2022-05-18 DIAGNOSIS — J301 Allergic rhinitis due to pollen: Secondary | ICD-10-CM | POA: Diagnosis not present

## 2022-05-18 DIAGNOSIS — R058 Other specified cough: Secondary | ICD-10-CM | POA: Diagnosis not present

## 2022-05-24 DIAGNOSIS — M5412 Radiculopathy, cervical region: Secondary | ICD-10-CM | POA: Diagnosis not present

## 2022-05-24 DIAGNOSIS — M546 Pain in thoracic spine: Secondary | ICD-10-CM | POA: Diagnosis not present

## 2022-05-24 DIAGNOSIS — M9901 Segmental and somatic dysfunction of cervical region: Secondary | ICD-10-CM | POA: Diagnosis not present

## 2022-05-24 DIAGNOSIS — M9902 Segmental and somatic dysfunction of thoracic region: Secondary | ICD-10-CM | POA: Diagnosis not present

## 2022-05-25 DIAGNOSIS — M9901 Segmental and somatic dysfunction of cervical region: Secondary | ICD-10-CM | POA: Diagnosis not present

## 2022-05-25 DIAGNOSIS — J301 Allergic rhinitis due to pollen: Secondary | ICD-10-CM | POA: Diagnosis not present

## 2022-05-25 DIAGNOSIS — F4323 Adjustment disorder with mixed anxiety and depressed mood: Secondary | ICD-10-CM | POA: Diagnosis not present

## 2022-05-25 DIAGNOSIS — I10 Essential (primary) hypertension: Secondary | ICD-10-CM | POA: Diagnosis not present

## 2022-05-25 DIAGNOSIS — E782 Mixed hyperlipidemia: Secondary | ICD-10-CM | POA: Diagnosis not present

## 2022-05-25 DIAGNOSIS — Z79899 Other long term (current) drug therapy: Secondary | ICD-10-CM | POA: Diagnosis not present

## 2022-05-25 DIAGNOSIS — M5412 Radiculopathy, cervical region: Secondary | ICD-10-CM | POA: Diagnosis not present

## 2022-05-25 DIAGNOSIS — M546 Pain in thoracic spine: Secondary | ICD-10-CM | POA: Diagnosis not present

## 2022-05-25 DIAGNOSIS — M9902 Segmental and somatic dysfunction of thoracic region: Secondary | ICD-10-CM | POA: Diagnosis not present

## 2022-05-29 DIAGNOSIS — M9901 Segmental and somatic dysfunction of cervical region: Secondary | ICD-10-CM | POA: Diagnosis not present

## 2022-05-29 DIAGNOSIS — M546 Pain in thoracic spine: Secondary | ICD-10-CM | POA: Diagnosis not present

## 2022-05-29 DIAGNOSIS — M9902 Segmental and somatic dysfunction of thoracic region: Secondary | ICD-10-CM | POA: Diagnosis not present

## 2022-05-29 DIAGNOSIS — M5412 Radiculopathy, cervical region: Secondary | ICD-10-CM | POA: Diagnosis not present

## 2022-06-08 DIAGNOSIS — J301 Allergic rhinitis due to pollen: Secondary | ICD-10-CM | POA: Diagnosis not present

## 2022-06-15 DIAGNOSIS — J301 Allergic rhinitis due to pollen: Secondary | ICD-10-CM | POA: Diagnosis not present

## 2022-06-28 DIAGNOSIS — J301 Allergic rhinitis due to pollen: Secondary | ICD-10-CM | POA: Diagnosis not present

## 2022-06-29 DIAGNOSIS — J301 Allergic rhinitis due to pollen: Secondary | ICD-10-CM | POA: Diagnosis not present

## 2022-07-03 ENCOUNTER — Ambulatory Visit: Payer: BC Managed Care – PPO | Admitting: Gastroenterology

## 2022-07-06 ENCOUNTER — Ambulatory Visit: Payer: BC Managed Care – PPO | Admitting: Gastroenterology

## 2022-07-06 ENCOUNTER — Ambulatory Visit: Payer: BC Managed Care – PPO | Admitting: Podiatry

## 2022-07-06 DIAGNOSIS — L600 Ingrowing nail: Secondary | ICD-10-CM | POA: Diagnosis not present

## 2022-07-06 NOTE — Progress Notes (Signed)
Subjective:  Patient ID: Dana Hensley, female    DOB: 1961/01/13,  MRN: [id number]  Chief Complaint  Patient presents with   Ingrown Toenail    61 y.o. female presents with the above complaint.  Patient presents with right hallux lateral border ingrown.  Patient states pain for touch is progressive gotten worse.  Wanted to get it evaluated.  Hurts with ambulation.  She has not seen anyone else prior to seeing me.  Pain scale is 5 out of 10 minimal however she wants to not have it removed and get numbed up.  She would like for me to do it like a slant back procedure.   Review of Systems: Negative except as noted in the HPI. Denies N/V/F/Ch.  Past Medical History:  Diagnosis Date   Allergy    Anxiety    Asthma    B12 deficiency    Barrett esophagus    Chronic atrophic gastritis    DVT, lower extremity ([facility])    Gastric polyps    HLD (hyperlipidemia)    Hypothyroidism    IGT (impaired glucose tolerance)    Neuroendocrine tumor    carcinoid tumors of the stomach, s/p extraction surgery at [facility]   PONV (postoperative nausea and vomiting)    Vitamin D deficiency     Current Outpatient Medications:    Ascorbic Acid (VITAMIN C) 1000 MG tablet, Take 1,000 mg by mouth daily., Disp: , Rfl:    atorvastatin (LIPITOR) 20 MG tablet, Take 1 tablet (20 mg total) by mouth daily., Disp: 30 tablet, Rfl: 0   augmented betamethasone dipropionate (DIPROLENE-AF) 0.05 % ointment, Apply topically 2 (two) times daily as needed., Disp: , Rfl:    cetirizine (ZYRTEC) 10 MG tablet, Take 10 mg by mouth daily., Disp: , Rfl:    clobetasol ointment (TEMOVATE) 6.76 %, Apply 1 application topically as needed., Disp: , Rfl:    conjugated estrogens (PREMARIN) vaginal cream, Place 1 Applicatorful vaginally daily., Disp: 42.5 g, Rfl: 12   cyanocobalamin (,VITAMIN B-12,) 1000 MCG/ML injection, Inject 1,000 mcg into the muscle every 30 (thirty) days., Disp: , Rfl:    Cyanocobalamin 1000 MCG TBCR, Take by mouth., Disp:  , Rfl:    dicyclomine (BENTYL) 10 MG capsule, TAKE 1 CAPSULE (10 MG TOTAL) BY MOUTH 4 (FOUR) TIMES DAILY - BEFORE MEALS AND AT BEDTIME., Disp: 120 capsule, Rfl: 6   EPINEPHrine 0.3 mg/0.3 mL IJ SOAJ injection, Inject into the muscle., Disp: , Rfl:    esomeprazole (NEXIUM) 20 MG capsule, Take 1 capsule (20 mg total) by mouth daily at 12 noon., Disp: 30 capsule, Rfl: 3   ferrous sulfate 325 (65 FE) MG tablet, Take 325 mg by mouth daily with breakfast., Disp: , Rfl:    levothyroxine (SYNTHROID, LEVOTHROID) 75 MCG tablet, Take 1 tablet (75 mcg total) by mouth daily., Disp: 90 tablet, Rfl: 1   mometasone (ELOCON) 0.1 % ointment, Apply 1 application topically as needed., Disp: , Rfl:    Multiple Vitamin (MULTI-VITAMINS) TABS, Take 1 tablet by mouth daily. , Disp: , Rfl:    Multiple Vitamins-Minerals (ZINC PO), Take by mouth., Disp: , Rfl:    pimecrolimus (ELIDEL) 1 % cream, Apply topically., Disp: , Rfl:    polyethylene glycol-electrolytes (GAVILYTE-N WITH FLAVOR PACK) 420 g solution, Drink one 8 oz glass every 20 mins until entire container is finished starting at 5:00pm on 04/20/21, Disp: 4000 mL, Rfl: 0  Social History   Tobacco Use  Smoking Status Never  Smokeless Tobacco Never  Allergies  Allergen Reactions   Latex Swelling    SWELLING AROUND FACE SWELLING AROUND FACE   Neomycin Other (See Comments) and Rash    Seen from allergy skin test Seen from allergy skin test   Neosporin  [Neomycin-Bacitracin Zn-Polymyx] Other (See Comments)    Seen from allergy skin test   Neomycin-Polymyxin-Gramicidin     Other reaction(s): Unknown Seen from allergy skin test   Other Other (See Comments)    THIMERSOL. THIMERSOL- seen from allergy skin test Other reaction(s): Unknown THIMERSOL. THIMERSOL- seen from allergy skin test   Objective:  There were no vitals filed for this visit. There is no height or weight on file to calculate BMI. Constitutional Well developed. Well nourished.  Vascular  Dorsalis pedis pulses palpable bilaterally. Posterior tibial pulses palpable bilaterally. Capillary refill normal to all digits.  No cyanosis or clubbing noted. Pedal hair growth normal.  Neurologic Normal speech. Oriented to person, place, and time. Epicritic sensation to light touch grossly present bilaterally.  Dermatologic Painful ingrowing nail at lateral nail borders of the hallux nail right. No other open wounds. No skin lesions.  Orthopedic: Normal joint ROM without pain or crepitus bilaterally. No visible deformities. No bony tenderness.   Radiographs: None Assessment:   1. Ingrown toenail of right foot    Plan:  Patient was evaluated and treated and all questions answered.  Ingrown Nail, right -Patient elected undergo slant back procedure.  I encouraged her that she will benefit from removal of the ingrown nail however she just wanted me to do a slant back procedure as she is afraid of needles.  I discussed with her that that is completely fine if it continues to bother her we can remove the corner of the nail and do a procedure patient agrees with the plan No follow-ups on file.

## 2022-07-13 DIAGNOSIS — J301 Allergic rhinitis due to pollen: Secondary | ICD-10-CM | POA: Diagnosis not present

## 2022-07-20 DIAGNOSIS — J301 Allergic rhinitis due to pollen: Secondary | ICD-10-CM | POA: Diagnosis not present

## 2022-07-27 DIAGNOSIS — J301 Allergic rhinitis due to pollen: Secondary | ICD-10-CM | POA: Diagnosis not present

## 2022-09-22 DIAGNOSIS — M5412 Radiculopathy, cervical region: Secondary | ICD-10-CM | POA: Diagnosis not present

## 2022-09-22 DIAGNOSIS — M9902 Segmental and somatic dysfunction of thoracic region: Secondary | ICD-10-CM | POA: Diagnosis not present

## 2022-09-22 DIAGNOSIS — M9901 Segmental and somatic dysfunction of cervical region: Secondary | ICD-10-CM | POA: Diagnosis not present

## 2022-09-22 DIAGNOSIS — M546 Pain in thoracic spine: Secondary | ICD-10-CM | POA: Diagnosis not present

## 2022-09-25 ENCOUNTER — Encounter (INDEPENDENT_AMBULATORY_CARE_PROVIDER_SITE_OTHER): Payer: Self-pay

## 2022-09-25 DIAGNOSIS — M5412 Radiculopathy, cervical region: Secondary | ICD-10-CM | POA: Diagnosis not present

## 2022-09-25 DIAGNOSIS — M546 Pain in thoracic spine: Secondary | ICD-10-CM | POA: Diagnosis not present

## 2022-09-25 DIAGNOSIS — M9902 Segmental and somatic dysfunction of thoracic region: Secondary | ICD-10-CM | POA: Diagnosis not present

## 2022-09-25 DIAGNOSIS — M9901 Segmental and somatic dysfunction of cervical region: Secondary | ICD-10-CM | POA: Diagnosis not present

## 2022-09-27 DIAGNOSIS — M9902 Segmental and somatic dysfunction of thoracic region: Secondary | ICD-10-CM | POA: Diagnosis not present

## 2022-09-27 DIAGNOSIS — M546 Pain in thoracic spine: Secondary | ICD-10-CM | POA: Diagnosis not present

## 2022-09-27 DIAGNOSIS — M5412 Radiculopathy, cervical region: Secondary | ICD-10-CM | POA: Diagnosis not present

## 2022-09-27 DIAGNOSIS — M9901 Segmental and somatic dysfunction of cervical region: Secondary | ICD-10-CM | POA: Diagnosis not present

## 2022-10-03 DIAGNOSIS — M9902 Segmental and somatic dysfunction of thoracic region: Secondary | ICD-10-CM | POA: Diagnosis not present

## 2022-10-03 DIAGNOSIS — M9901 Segmental and somatic dysfunction of cervical region: Secondary | ICD-10-CM | POA: Diagnosis not present

## 2022-10-03 DIAGNOSIS — M546 Pain in thoracic spine: Secondary | ICD-10-CM | POA: Diagnosis not present

## 2022-10-03 DIAGNOSIS — M5412 Radiculopathy, cervical region: Secondary | ICD-10-CM | POA: Diagnosis not present

## 2022-10-10 DIAGNOSIS — M9902 Segmental and somatic dysfunction of thoracic region: Secondary | ICD-10-CM | POA: Diagnosis not present

## 2022-10-10 DIAGNOSIS — M9901 Segmental and somatic dysfunction of cervical region: Secondary | ICD-10-CM | POA: Diagnosis not present

## 2022-10-10 DIAGNOSIS — M546 Pain in thoracic spine: Secondary | ICD-10-CM | POA: Diagnosis not present

## 2022-10-10 DIAGNOSIS — M5412 Radiculopathy, cervical region: Secondary | ICD-10-CM | POA: Diagnosis not present

## 2022-10-16 ENCOUNTER — Telehealth (INDEPENDENT_AMBULATORY_CARE_PROVIDER_SITE_OTHER): Payer: Self-pay | Admitting: Nurse Practitioner

## 2022-10-16 ENCOUNTER — Other Ambulatory Visit: Payer: Self-pay | Admitting: Nurse Practitioner

## 2022-10-16 DIAGNOSIS — M9901 Segmental and somatic dysfunction of cervical region: Secondary | ICD-10-CM | POA: Diagnosis not present

## 2022-10-16 DIAGNOSIS — M9902 Segmental and somatic dysfunction of thoracic region: Secondary | ICD-10-CM | POA: Diagnosis not present

## 2022-10-16 DIAGNOSIS — Z1231 Encounter for screening mammogram for malignant neoplasm of breast: Secondary | ICD-10-CM

## 2022-10-16 DIAGNOSIS — M546 Pain in thoracic spine: Secondary | ICD-10-CM | POA: Diagnosis not present

## 2022-10-16 DIAGNOSIS — M5412 Radiculopathy, cervical region: Secondary | ICD-10-CM | POA: Diagnosis not present

## 2022-10-16 NOTE — Telephone Encounter (Signed)
Left a message on patient voicemail to return call

## 2022-10-16 NOTE — Telephone Encounter (Signed)
Patient Dana Hensley asking for an appointment because of veins.  Please advise

## 2022-10-16 NOTE — Telephone Encounter (Signed)
Patient scheduled.

## 2022-10-17 ENCOUNTER — Ambulatory Visit
Admission: RE | Admit: 2022-10-17 | Discharge: 2022-10-17 | Disposition: A | Discharge status: Home / Self Care | Payer: 59 | Source: Ambulatory Visit | Attending: Nurse Practitioner | Admitting: Nurse Practitioner

## 2022-10-17 DIAGNOSIS — Z1231 Encounter for screening mammogram for malignant neoplasm of breast: Secondary | ICD-10-CM | POA: Insufficient documentation

## 2022-10-31 DIAGNOSIS — M9901 Segmental and somatic dysfunction of cervical region: Secondary | ICD-10-CM | POA: Diagnosis not present

## 2022-10-31 DIAGNOSIS — M546 Pain in thoracic spine: Secondary | ICD-10-CM | POA: Diagnosis not present

## 2022-10-31 DIAGNOSIS — M5412 Radiculopathy, cervical region: Secondary | ICD-10-CM | POA: Diagnosis not present

## 2022-10-31 DIAGNOSIS — M9902 Segmental and somatic dysfunction of thoracic region: Secondary | ICD-10-CM | POA: Diagnosis not present

## 2022-11-21 DIAGNOSIS — H6691 Otitis media, unspecified, right ear: Secondary | ICD-10-CM | POA: Diagnosis not present

## 2022-11-23 ENCOUNTER — Ambulatory Visit (INDEPENDENT_AMBULATORY_CARE_PROVIDER_SITE_OTHER): Payer: BC Managed Care – PPO | Admitting: Nurse Practitioner

## 2022-11-23 ENCOUNTER — Encounter (INDEPENDENT_AMBULATORY_CARE_PROVIDER_SITE_OTHER): Payer: BC Managed Care – PPO

## 2022-11-27 DIAGNOSIS — Z419 Encounter for procedure for purposes other than remedying health state, unspecified: Secondary | ICD-10-CM | POA: Diagnosis not present

## 2022-12-25 DIAGNOSIS — M546 Pain in thoracic spine: Secondary | ICD-10-CM | POA: Diagnosis not present

## 2022-12-25 DIAGNOSIS — M5412 Radiculopathy, cervical region: Secondary | ICD-10-CM | POA: Diagnosis not present

## 2022-12-25 DIAGNOSIS — M9902 Segmental and somatic dysfunction of thoracic region: Secondary | ICD-10-CM | POA: Diagnosis not present

## 2022-12-25 DIAGNOSIS — M9901 Segmental and somatic dysfunction of cervical region: Secondary | ICD-10-CM | POA: Diagnosis not present

## 2022-12-26 DIAGNOSIS — M9902 Segmental and somatic dysfunction of thoracic region: Secondary | ICD-10-CM | POA: Diagnosis not present

## 2022-12-26 DIAGNOSIS — M9901 Segmental and somatic dysfunction of cervical region: Secondary | ICD-10-CM | POA: Diagnosis not present

## 2022-12-26 DIAGNOSIS — M5412 Radiculopathy, cervical region: Secondary | ICD-10-CM | POA: Diagnosis not present

## 2022-12-26 DIAGNOSIS — M546 Pain in thoracic spine: Secondary | ICD-10-CM | POA: Diagnosis not present

## 2022-12-28 DIAGNOSIS — Z419 Encounter for procedure for purposes other than remedying health state, unspecified: Secondary | ICD-10-CM | POA: Diagnosis not present

## 2023-01-26 DIAGNOSIS — Z419 Encounter for procedure for purposes other than remedying health state, unspecified: Secondary | ICD-10-CM | POA: Diagnosis not present

## 2023-02-05 DIAGNOSIS — M545 Low back pain, unspecified: Secondary | ICD-10-CM | POA: Diagnosis not present

## 2023-02-05 DIAGNOSIS — M9903 Segmental and somatic dysfunction of lumbar region: Secondary | ICD-10-CM | POA: Diagnosis not present

## 2023-02-05 DIAGNOSIS — M546 Pain in thoracic spine: Secondary | ICD-10-CM | POA: Diagnosis not present

## 2023-02-05 DIAGNOSIS — M9901 Segmental and somatic dysfunction of cervical region: Secondary | ICD-10-CM | POA: Diagnosis not present

## 2023-02-05 DIAGNOSIS — M5412 Radiculopathy, cervical region: Secondary | ICD-10-CM | POA: Diagnosis not present

## 2023-02-05 DIAGNOSIS — M9902 Segmental and somatic dysfunction of thoracic region: Secondary | ICD-10-CM | POA: Diagnosis not present

## 2023-02-16 DIAGNOSIS — M5412 Radiculopathy, cervical region: Secondary | ICD-10-CM | POA: Diagnosis not present

## 2023-02-16 DIAGNOSIS — M545 Low back pain, unspecified: Secondary | ICD-10-CM | POA: Diagnosis not present

## 2023-02-16 DIAGNOSIS — M9903 Segmental and somatic dysfunction of lumbar region: Secondary | ICD-10-CM | POA: Diagnosis not present

## 2023-02-16 DIAGNOSIS — M9902 Segmental and somatic dysfunction of thoracic region: Secondary | ICD-10-CM | POA: Diagnosis not present

## 2023-02-16 DIAGNOSIS — M9901 Segmental and somatic dysfunction of cervical region: Secondary | ICD-10-CM | POA: Diagnosis not present

## 2023-02-16 DIAGNOSIS — M546 Pain in thoracic spine: Secondary | ICD-10-CM | POA: Diagnosis not present

## 2023-02-26 DIAGNOSIS — Z419 Encounter for procedure for purposes other than remedying health state, unspecified: Secondary | ICD-10-CM | POA: Diagnosis not present

## 2023-03-06 ENCOUNTER — Telehealth: Payer: Self-pay | Admitting: Gastroenterology

## 2023-03-06 NOTE — Telephone Encounter (Signed)
Per pt she has her procedure done at [facility]. Will have another one on  03-28-23 ,per pt  dr Servando Snare states she can return to see him for advice as well as see  md at [facility]. Is this correct ? do I make pt appt to see Dr. Servando Snare?

## 2023-03-12 DIAGNOSIS — M9903 Segmental and somatic dysfunction of lumbar region: Secondary | ICD-10-CM | POA: Diagnosis not present

## 2023-03-12 DIAGNOSIS — M5412 Radiculopathy, cervical region: Secondary | ICD-10-CM | POA: Diagnosis not present

## 2023-03-12 DIAGNOSIS — M9902 Segmental and somatic dysfunction of thoracic region: Secondary | ICD-10-CM | POA: Diagnosis not present

## 2023-03-12 DIAGNOSIS — M545 Low back pain, unspecified: Secondary | ICD-10-CM | POA: Diagnosis not present

## 2023-03-12 DIAGNOSIS — M9901 Segmental and somatic dysfunction of cervical region: Secondary | ICD-10-CM | POA: Diagnosis not present

## 2023-03-12 DIAGNOSIS — M546 Pain in thoracic spine: Secondary | ICD-10-CM | POA: Diagnosis not present

## 2023-03-16 DIAGNOSIS — M9903 Segmental and somatic dysfunction of lumbar region: Secondary | ICD-10-CM | POA: Diagnosis not present

## 2023-03-16 DIAGNOSIS — M5412 Radiculopathy, cervical region: Secondary | ICD-10-CM | POA: Diagnosis not present

## 2023-03-16 DIAGNOSIS — M9902 Segmental and somatic dysfunction of thoracic region: Secondary | ICD-10-CM | POA: Diagnosis not present

## 2023-03-16 DIAGNOSIS — M545 Low back pain, unspecified: Secondary | ICD-10-CM | POA: Diagnosis not present

## 2023-03-16 DIAGNOSIS — M546 Pain in thoracic spine: Secondary | ICD-10-CM | POA: Diagnosis not present

## 2023-03-16 DIAGNOSIS — M9901 Segmental and somatic dysfunction of cervical region: Secondary | ICD-10-CM | POA: Diagnosis not present

## 2023-03-28 DIAGNOSIS — Z8719 Personal history of other diseases of the digestive system: Secondary | ICD-10-CM | POA: Diagnosis not present

## 2023-03-28 DIAGNOSIS — K317 Polyp of stomach and duodenum: Secondary | ICD-10-CM | POA: Diagnosis not present

## 2023-03-28 DIAGNOSIS — K31A11 Gastric intestinal metaplasia without dysplasia, involving the antrum: Secondary | ICD-10-CM | POA: Diagnosis not present

## 2023-03-28 DIAGNOSIS — K2950 Unspecified chronic gastritis without bleeding: Secondary | ICD-10-CM | POA: Diagnosis not present

## 2023-03-28 DIAGNOSIS — D3A8 Other benign neuroendocrine tumors: Secondary | ICD-10-CM | POA: Diagnosis not present

## 2023-03-28 DIAGNOSIS — Z419 Encounter for procedure for purposes other than remedying health state, unspecified: Secondary | ICD-10-CM | POA: Diagnosis not present

## 2023-04-04 ENCOUNTER — Encounter: Payer: Self-pay | Admitting: Gastroenterology

## 2023-04-04 ENCOUNTER — Telehealth (INDEPENDENT_AMBULATORY_CARE_PROVIDER_SITE_OTHER): Payer: Medicaid Other | Admitting: Gastroenterology

## 2023-04-04 VITALS — Wt 172.0 lb

## 2023-04-04 DIAGNOSIS — K317 Polyp of stomach and duodenum: Secondary | ICD-10-CM

## 2023-04-04 DIAGNOSIS — Z85020 Personal history of malignant carcinoid tumor of stomach: Secondary | ICD-10-CM

## 2023-04-04 MED ORDER — DICYCLOMINE HCL 10 MG PO CAPS: 10.0000 mg | ORAL_CAPSULE | Freq: Every day | ORAL | 5 refills | Status: DC

## 2023-04-04 NOTE — Progress Notes (Signed)
Midge Minium, MD [address]  [address]  [place], [place] [address]  Main: [phone]  Fax: [phone]    Gastroenterology Virtual/Video Visit  Referring Provider:     Myrene Buddy, * Primary Care Physician:  Myrene Buddy, NP Primary Gastroenterologist:  Dr.Earnest Thalman Servando Snare Reason for Consultation:     History of a neuroendocrine tumor        HPI:    Virtual Visit via Video Note Location of the patient: Home Location of provider: Home Office Participating persons: The patient and myself.  I connected with Dana Hensley on 04/04/23 at 10:30 AM EDT by a video enabled telemedicine application and verified that I am speaking with the correct person using two identifiers.   I discussed the limitations of evaluation and management by telemedicine and the availability of in person appointments. The patient expressed understanding and agreed to proceed.  Verbal consent to proceed obtained.  History of Present Illness: Dana Hensley is a 62 y.o. female referred by Dr. Bayard Males, Hermenia Fiscal, NP  for consultation & management of a history of neuroendocrine tumor in her stomach.  This patient comes in today after being followed at [facility] for gastric lesions that in the past have shown a neuroendocrine tumor.  The patient had a repeat upper endoscopy on May 1, one week ago.  At that time the pathology showed:  A.  Stomach polyp, endoscopic polypectomy:   Well-differentiated neuroendocrine tumor, please see comment. Background gastric antral type mucosa with chronic gastritis and intestinal metaplasia, complete type.  The pathology reported that the margins could not be assessed because of the lesion being in multiple fragments.  The patient states that she received a letter stating that she should talk to her primary gastroenterologist about the pathology.  The pathology appears to be very similar to the pathology she had in 2022.  The patient  has been told that I would continue with their recommendations and she states that their recommendation was to have a repeat upper endoscopy in 1 year.  Past Medical History:  Diagnosis Date   Allergy    Anxiety    Asthma    B12 deficiency    Barrett esophagus    Chronic atrophic gastritis    DVT, lower extremity ([facility])    Gastric polyps    HLD (hyperlipidemia)    Hypothyroidism    IGT (impaired glucose tolerance)    Neuroendocrine tumor    carcinoid tumors of the stomach, s/p extraction surgery at [facility]   PONV (postoperative nausea and vomiting)    Vitamin D deficiency     Past Surgical History:  Procedure Laterality Date   ABDOMINAL HYSTERECTOMY     APPENDECTOMY     CHOLECYSTECTOMY N/A 11/16/2016   Procedure: LAPAROSCOPIC CHOLECYSTECTOMY WITH INTRAOPERATIVE CHOLANGIOGRAM;  Surgeon: Nadeen Landau, MD;  Location: [facility] ORS;  Service: General;  Laterality: N/A;   COLONOSCOPY WITH PROPOFOL N/A 04/21/2021   Procedure: COLONOSCOPY WITH PROPOFOL;  Surgeon: Midge Minium, MD;  Location: [facility] ENDOSCOPY;  Service: Endoscopy;  Laterality: N/A;   DIAGNOSTIC LAPAROSCOPY     ESOPHAGOGASTRODUODENOSCOPY     TONSILLECTOMY     VEIN LIGATION AND STRIPPING      Prior to Admission medications   Medication Sig Start Date End Date Taking? Authorizing Provider  Ascorbic Acid (VITAMIN C) 1000 MG tablet Take 1,000 mg by mouth daily.    [provider]  atorvastatin (LIPITOR) 20 MG tablet Take 1 tablet (20 mg total)  by mouth daily. 08/03/21   Marrion Coy, MD  augmented betamethasone dipropionate (DIPROLENE-AF) 0.05 % ointment Apply topically 2 (two) times daily as needed. 04/01/21   [provider]  cetirizine (ZYRTEC) 10 MG tablet Take 10 mg by mouth daily.    [provider]  clobetasol ointment (TEMOVATE) 0.05 % Apply 1 application topically as needed.    [provider]  conjugated estrogens (PREMARIN) vaginal cream Place 1 Applicatorful vaginally daily. 03/29/21    Schuman, Christanna R, MD  cyanocobalamin (,VITAMIN B-12,) 1000 MCG/ML injection Inject 1,000 mcg into the muscle every 30 (thirty) days. 03/12/21   [provider]  Cyanocobalamin 1000 MCG TBCR Take by mouth.    [provider]  dicyclomine (BENTYL) 10 MG capsule TAKE 1 CAPSULE (10 MG TOTAL) BY MOUTH 4 (FOUR) TIMES DAILY - BEFORE MEALS AND AT BEDTIME. 09/29/20   Midge Minium, MD  EPINEPHrine 0.3 mg/0.3 mL IJ SOAJ injection Inject into the muscle. 03/27/17   [provider]  esomeprazole (NEXIUM) 20 MG capsule Take 1 capsule (20 mg total) by mouth daily at 12 noon. 04/12/22   Midge Minium, MD  ferrous sulfate 325 (65 FE) MG tablet Take 325 mg by mouth daily with breakfast.    [provider]  levothyroxine (SYNTHROID, LEVOTHROID) 75 MCG tablet Take 1 tablet (75 mcg total) by mouth daily. 03/15/16   Lada, Janit Bern, MD  mometasone (ELOCON) 0.1 % ointment Apply 1 application topically as needed.    [provider]  Multiple Vitamin (MULTI-VITAMINS) TABS Take 1 tablet by mouth daily.  10/07/04   [provider]  Multiple Vitamins-Minerals (ZINC PO) Take by mouth.    [provider]  pimecrolimus (ELIDEL) 1 % cream Apply topically. 04/12/18   [provider]  polyethylene glycol-electrolytes (GAVILYTE-N WITH FLAVOR PACK) 420 g solution Drink one 8 oz glass every 20 mins until entire container is finished starting at 5:00pm on 04/20/21 04/04/21   Midge Minium, MD    Family History  Problem Relation Age of Onset   Clotting disorder Mother    Hyperlipidemia Father    Breast cancer Neg Hx      Social History   Tobacco Use   Smoking status: Never   Smokeless tobacco: Never  Vaping Use   Vaping Use: Never used  Substance Use Topics   Alcohol use: No    Alcohol/week: 0.0 standard drinks of alcohol   Drug use: No    Allergies as of 04/04/2023 - Review Complete 07/06/2022  Allergen Reaction Noted   Latex Swelling 07/09/2015    Neomycin Other (See Comments) and Rash 07/09/2015   Neosporin  [neomycin-bacitracin zn-polymyx] Other (See Comments) 07/09/2015   Neomycin-polymyxin-gramicidin  07/09/2015   Other Other (See Comments) 07/09/2015    Review of Systems:    All systems reviewed and negative except where noted in HPI.   Observations/Objective:  Labs: CBC    Component Value Date/Time   WBC 5.5 08/03/2021 0610   RBC 4.21 08/03/2021 0610   HGB 12.1 08/03/2021 0610   HGB 12.3 10/26/2015 0947   HCT 36.8 08/03/2021 0610   HCT 37.1 10/26/2015 0947   PLT 234 08/03/2021 0610   PLT 261 10/26/2015 0947   MCV 87.4 08/03/2021 0610   MCV 87 10/26/2015 0947   MCH 28.7 08/03/2021 0610   MCHC 32.9 08/03/2021 0610   RDW 13.2 08/03/2021 0610   RDW 14.3 10/26/2015 0947   LYMPHSABS 2.3 08/03/2021 0610   LYMPHSABS 2.6 10/26/2015 0947  MONOABS 0.3 08/03/2021 0610   EOSABS 0.2 08/03/2021 0610   EOSABS 0.2 10/26/2015 0947   BASOSABS 0.0 08/03/2021 0610   BASOSABS 0.0 10/26/2015 0947   CMP     Component Value Date/Time   NA 138 08/03/2021 0610   NA 142 10/26/2015 0947   K 3.8 08/03/2021 0610   CL 102 08/03/2021 0610   CO2 26 08/03/2021 0610   GLUCOSE 94 08/03/2021 0610   BUN 13 08/03/2021 0610   BUN 14 10/26/2015 0947   CREATININE 0.59 08/03/2021 0610   CALCIUM 9.1 08/03/2021 0610   PROT 7.0 08/03/2021 0610   PROT 7.1 09/08/2020 0946   ALBUMIN 3.7 08/03/2021 0610   ALBUMIN 4.1 09/08/2020 0946   AST 13 (L) 08/03/2021 0610   ALT 10 08/03/2021 0610   ALKPHOS 52 08/03/2021 0610   BILITOT 0.8 08/03/2021 0610   BILITOT <0.2 09/08/2020 0946   GFRNONAA >60 08/03/2021 0610   GFRAA >60 04/16/2017 1420    Imaging Studies: No results found.  Assessment and Plan:   Dana Hensley is a 62 y.o. y/o female has been referred for follow-up after having a upper endoscopy at [facility] with polyps removed and 1 being a neuroendocrine tumor with the pathology showing it to be well treated.  This is  similar to the previous lesion removed back in 2022.  The patient has been explained this and has been told to follow-up with them for repeat upper endoscopy in 1 years time as they recommended.  Patient would also like a refill of her dicyclomine and that will be sent in.  Follow Up Instructions:  I discussed the assessment and treatment plan with the patient. The patient was provided an opportunity to ask questions and all were answered. The patient agreed with the plan and demonstrated an understanding of the instructions.   The patient was advised to call back or seek an in-person evaluation if the symptoms worsen or if the condition fails to improve as anticipated.  I provided 30 minutes of non-face-to-face time during this encounter including chart review In preparation for the encounter.   Midge Minium, MD  Speech recognition software was used to dictate the above note.

## 2023-04-28 DIAGNOSIS — Z419 Encounter for procedure for purposes other than remedying health state, unspecified: Secondary | ICD-10-CM | POA: Diagnosis not present

## 2023-05-28 DIAGNOSIS — Z419 Encounter for procedure for purposes other than remedying health state, unspecified: Secondary | ICD-10-CM | POA: Diagnosis not present

## 2023-06-07 ENCOUNTER — Ambulatory Visit: Payer: Medicaid Other | Admitting: Gastroenterology

## 2023-06-28 DIAGNOSIS — Z419 Encounter for procedure for purposes other than remedying health state, unspecified: Secondary | ICD-10-CM | POA: Diagnosis not present

## 2023-07-04 ENCOUNTER — Ambulatory Visit: Payer: Medicaid Other | Admitting: Family Medicine

## 2023-07-24 ENCOUNTER — Encounter: Payer: Self-pay | Admitting: Family Medicine

## 2023-07-24 ENCOUNTER — Ambulatory Visit (INDEPENDENT_AMBULATORY_CARE_PROVIDER_SITE_OTHER): Payer: Medicaid Other | Admitting: Family Medicine

## 2023-07-24 VITALS — BP 128/87 | HR 96 | Ht 65.0 in | Wt 173.0 lb

## 2023-07-24 DIAGNOSIS — E785 Hyperlipidemia, unspecified: Secondary | ICD-10-CM

## 2023-07-24 DIAGNOSIS — E538 Deficiency of other specified B group vitamins: Secondary | ICD-10-CM

## 2023-07-24 DIAGNOSIS — R7303 Prediabetes: Secondary | ICD-10-CM

## 2023-07-24 DIAGNOSIS — E559 Vitamin D deficiency, unspecified: Secondary | ICD-10-CM

## 2023-07-24 DIAGNOSIS — Z7689 Persons encountering health services in other specified circumstances: Secondary | ICD-10-CM

## 2023-07-24 DIAGNOSIS — E039 Hypothyroidism, unspecified: Secondary | ICD-10-CM

## 2023-07-24 NOTE — Progress Notes (Signed)
Subjective:    Patient ID: Dana Hensley, female    DOB: October 25, 1961, 62 y.o.   MRN: [id number]  Dana Hensley is a 62 y.o. 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 Medicaid coverage. She lives with her husband. Other family in town, son is in [place], and there are some grandchildre.  HPI  2010 diagnosed, Carcinoid Polyp. Removed. Determined non-cancerous She has had endoscopy yearly surveillance. Previously EGD Barrett's  She has followed with [facility] Dr Servando Snare more recently.  She would prefer to have both scheduled same time She will need to go to [facility] for - Next Endoscopy + Colonoscopy 2025.  Gastric Soothe -zinc L-carnosine for stomach lining protection  Last visit 03/2022 Dr Servando Snare recommended PPI nexium 40mg  dailiy  Neuroendocrine Tumor  She was given no information on the neuroendocrine tumor Interested in diet advice  Takes probiotic, flaxseed  Prefers not to go back to [facility]  She plans to retire next year.   Health Maintenance:  Last Colonoscopy 04/21/21 - negative, no specimens collected. Repeat in 5 years. Approx 2027.  She will need Endoscopy EGD + Colonoscopy next time. She needs EGD yearly for surveillance.     07/24/2023    2:10 PM 11/26/2015   10:10 AM 10/18/2015    9:31 AM  Depression screen PHQ 2/9  Decreased Interest 0 0 0  Down, Depressed, Hopeless 0 0 0  PHQ - 2 Score 0 0 0  Altered sleeping 1    Tired, decreased energy 0    Change in appetite 0    Feeling bad or failure about yourself  0    Trouble concentrating 0    Moving slowly or fidgety/restless 0    Suicidal thoughts 0    PHQ-9 Score 1    Difficult doing work/chores Not difficult at all      Past Medical History:  Diagnosis Date   Allergy    Anxiety     Asthma    B12 deficiency    Barrett esophagus    Chronic atrophic gastritis    DVT, lower extremity (HCC)    Gastric polyps    HLD (hyperlipidemia)    Hypothyroidism    IGT (impaired glucose tolerance)    Neuroendocrine tumor    carcinoid tumors of the stomach, s/p extraction surgery at [facility]   PONV (postoperative nausea and vomiting)    Vitamin D deficiency    Past Surgical History:  Procedure Laterality Date   ABDOMINAL HYSTERECTOMY     APPENDECTOMY     CHOLECYSTECTOMY N/A 11/16/2016   Procedure: LAPAROSCOPIC CHOLECYSTECTOMY WITH INTRAOPERATIVE CHOLANGIOGRAM;  Surgeon: Nadeen Landau, MD;  Location: [facility] ORS;  Service: General;  Laterality: N/A;   COLONOSCOPY WITH PROPOFOL N/A 04/21/2021   Procedure: COLONOSCOPY WITH PROPOFOL;  Surgeon: Midge Minium, MD;  Location: [facility] ENDOSCOPY;  Service: Endoscopy;  Laterality: N/A;   DIAGNOSTIC LAPAROSCOPY     ESOPHAGOGASTRODUODENOSCOPY     TONSILLECTOMY     VEIN LIGATION AND STRIPPING     Social History   Socioeconomic History   Marital status: Married    Spouse name: Not on file   Number of children: 1   Years of education: Not on file   Highest education level: Not on file  Occupational History  Not on file  Tobacco Use   Smoking status: Never   Smokeless tobacco: Never  Vaping Use   Vaping status: Never Used  Substance and Sexual Activity   Alcohol use: No    Alcohol/week: 0.0 standard drinks of alcohol   Drug use: No   Sexual activity: Not Currently    Birth control/protection: None, Surgical    Comment: Hysterectomy  Other Topics Concern   Not on file  Social History Narrative   Not on file   Social Determinants of Health   Financial Resource Strain: Not on file  Food Insecurity: Not on file  Transportation Needs: Not on file  Physical Activity: Not on file  Stress: Not on file  Social Connections: Not on file  Intimate Partner Violence: Not on file   Family History  Problem Relation Age of Onset    Clotting disorder Mother    Hyperlipidemia Father    Breast cancer Neg Hx    Current Outpatient Medications on File Prior to Visit  Medication Sig   cetirizine (ZYRTEC) 10 MG tablet Take 10 mg by mouth daily.   cyanocobalamin (,VITAMIN B-12,) 1000 MCG/ML injection Inject 1,000 mcg into the muscle every 30 (thirty) days.   Cyanocobalamin 1000 MCG TBCR Take by mouth.   levothyroxine (SYNTHROID, LEVOTHROID) 75 MCG tablet Take 1 tablet (75 mcg total) by mouth daily.   Multiple Vitamin (MULTI-VITAMINS) TABS Take 1 tablet by mouth daily.    rosuvastatin (CRESTOR) 10 MG tablet Take by mouth.   EPINEPHrine 0.3 mg/0.3 mL IJ SOAJ injection Inject into the muscle. (Patient not taking: Reported on 07/24/2023)   No current facility-administered medications on file prior to visit.    Review of Systems Per HPI unless specifically indicated above      Objective:    BP 128/87   Pulse 96   Ht 5\' 5"  (1.651 m)   Wt 173 lb (78.5 kg)   SpO2 98%   BMI 28.79 kg/m   Wt Readings from Last 3 Encounters:  07/24/23 173 lb (78.5 kg)  04/04/23 172 lb (78 kg)  04/12/22 183 lb (83 kg)    Physical Exam Vitals and nursing note reviewed.  Constitutional:      General: She is not in acute distress.    Appearance: Normal appearance. She is well-developed. She is not diaphoretic.     Comments: Well-appearing, comfortable, cooperative  HENT:     Head: Normocephalic and atraumatic.  Eyes:     General:        Right eye: No discharge.        Left eye: No discharge.     Conjunctiva/sclera: Conjunctivae normal.  Cardiovascular:     Rate and Rhythm: Normal rate.  Pulmonary:     Effort: Pulmonary effort is normal.  Skin:    General: Skin is warm and dry.     Findings: No erythema or rash.  Neurological:     Mental Status: She is alert and oriented to person, place, and time.  Psychiatric:        Mood and Affect: Mood normal.        Behavior: Behavior normal.        Thought Content: Thought content  normal.     Comments: Well groomed, good eye contact, normal speech and thoughts     Pathology - General / Other Specimen: Tissue-Pathology - Stomach structure (body structure) Component 3 mo ago  Case Report Surgical Pathology  Case: [id number]                               Authorizing Provider:  Jorene Minors, MD    Collected:           03/28/2023 1005             Ordering Location:     [facility]           Received:            03/28/2023 1041             Pathologist:           Judie Petit, MD                                                     Specimen:    Gastric, gastric polyp                                                                DIAGNOSIS   A.  Stomach polyp, endoscopic polypectomy:   Well-differentiated neuroendocrine tumor, please see comment. Background gastric antral type mucosa with chronic gastritis and intestinal metaplasia, complete type.    Electronically signed by Judie Petit, MD on 04/02/2023 at 11:30 AM  Diagnostic Comment   After review of clinical history and morphology, immunohistochemistry was performed to confirm the tumor type.  The tumor cells are positive for pancytokeratin, synaptophysin and INSM1 and confirm the diagnosis.  Ki-67 highlights 2 to 3% of the tumor nuclei based on manual count.  This is consistent with WHO grade 1 in this sampling. The tumor focus measures at least 3.5 mm.  Margin assessment is not possible due to presence of multiple tissue fragments.    Clinical Information   Neuroendocrine neoplasm of stomach (CMS-HCC)   EGD: - Normal esophagus. - Multiple diminutive gastric polyps. Three 5 mm gastric polyps. Resected and retrieved. - Normal duodenal bulb and second portion of the duodenum.  Gross Examination   A.  "Gastric polyp", received in formalin are 3 fragments of tan soft tissue measuring up to 0.9 cm, totally submitted in a mesh bag in block A1 for special processing.    J.Elam/ Dr. Dyane Dustman  Microscopic Examination   Microscopic examination is performed.  Additional Documentation   All immunohistochemistry, in situ hybridization tests and special stains performed at [facility] and reported herein were developed, validated and their performance characteristics determined by the [facility]. During the performance of these tests, appropriate positive and negative control slides are also performed and reviewed. All control slides and internal controls (when applicable) demonstrate the expected immunoreactive patterns and/or nucleic acid hybridization. These ancillary studies were deemed medically necessary by the requesting pathologist. They were ordered following review of the H&E and clinical history except when part of a liver/kidney protocol or where clinical history (e.g. immunocompromised, critically ill, history of malignancy) clearly indicates. Some of the tests may not be cleared or approved by the U.S. Food and Drug Administration (FDA).  The FDA  has determined that such clearance or approval is not necessary.  These tests are used for clinical purposes and should not be regarded as investigational or as research.  This laboratory is certified under the Clinical Laboratory Improvement Amendments of 1988 (CLIA) as qualified to perform high complexity clinical testing.  Attestation   All of the diagnostic evaluations on the enumerated specimens have been personally conducted by the pathologists involved in the care of this patient as indicated by the electronic signatures above.  Resulting Agency [facility] SURGICAL PATHOLOGY AND CYTOPATHOLOGY  Specimen Collected: 03/28/23 10:05   Performed by: [facility] SURGICAL PATHOLOGY AND CYTOPATHOLOGY Last Resulted: 04/02/23 11:30  Received From: [facility]  Result Received: 04/04/23 07:37     Results for orders placed or performed in visit on 04/13/22  Surgical pathology  Result Value  Ref Range   SURGICAL PATHOLOGY      Surgical Pathology CASE: [id number] PATIENT: [facility] Surgical Pathology Report     Specimen Submitted: A. Breast, left inner quadrant  Clinical History: Mass of upper inner quadrant of left breast N63.22. Mass of chest wall, left R22.2      DIAGNOSIS: A.  BREAST MASS, LEFT 10:00; BIOPSY: - PREDOMINANTLY MATURE ADIPOSE TISSUE WITH FOCAL AREAS OF MILDLY DILATED BENIGN MAMMARY DUCTS. - NEGATIVE FOR ATYPIA AND MALIGNANCY. - DEEPER SECTIONS EXAMINED.  Comment: The differential diagnosis includes a lipoma. Correlation with clinical impression and radiographic findings is required.  GROSS DESCRIPTION: A. Labeled: Left breast biopsy (per requisition 10 o'clock position) Received: Formalin Time/date in fixative: Collected and placed in formalin at 11:52 AM on 04/13/2022 Cold ischemic time: Less than 1 minute Total fixation time: Approximately 8.5 hours Core pieces: Multiple Size: Aggregate, 5 x 2.7 x 0.3 cm Description: Received in a white and clear  plastic collection device are fragments of yellow and red fibrofatty tissue. Ink color: Green Entirely submitted in cassettes 1-3.  RB 04/13/2022  Final Diagnosis performed by Elijah Birk, MD.   Electronically signed 04/14/2022 1:51:50PM The electronic signature indicates that the named Attending Pathologist has evaluated the specimen Technical component performed at [facility], [address], [place], [place] [address] Lab: [phone] Dir: Jolene Schimke, MD, MMM  Professional component performed at [facility], [address] [place], [place], [place] [address] Lab: [phone] Dir: Beryle Quant, MD       Assessment & Plan:   Problem List Items Addressed This Visit     Acquired hypothyroidism   Relevant Orders   TSH + free T4   Dyslipidemia - Primary   Relevant Medications   rosuvastatin (CRESTOR) 10 MG tablet   Other Relevant Orders   Lipid  panel   Vitamin B12 deficiency   Relevant Orders   CBC with Differential/Platelet   Vitamin B12   Other Visit Diagnoses     Encounter to establish care with new doctor       Pre-diabetes       Relevant Orders   Comprehensive metabolic panel   Hemoglobin A1c   Vitamin D deficiency       Relevant Orders   CBC with Differential/Platelet   VITAMIN D 25 Hydroxy (Vit-D Deficiency, Fractures)       Establish care Review outside chart and records  PreDM Overdue for A1c Encourage lifestyle management  Vitamin D and B12 Deficiency Check Labs today  Dyslipidemia Due for lipid panel Continues on Rosuvastatin  Check thyroid panel TSH + T4 On current dose levothyroxine  Needs re order Statin and Levothyroxine ONCE  RESULTS  No orders of the defined types were placed in this encounter.     Follow up plan: Return if symptoms worsen or fail to improve.  Will need to consider GI doc and integrative Specialist for referral  Return Tomorrow 8/28 at 9am for fasting labs  CMET LIPID CBC TSH T4 B12 Vitamin D A1c   Saralyn Pilar, DO [facility] 07/24/2023, 2:53 PM

## 2023-07-24 NOTE — Patient Instructions (Addendum)
Thank you for coming to the office today.  All labs ordered for tomorrow.  We can adjust / refill meds accordingly.  DUE for FASTING BLOOD WORK (no food or drink after midnight before the lab appointment, only water or coffee without cream/sugar on the morning of)  SCHEDULE "Lab Only" visit in the morning at the clinic for lab draw in 1 day  - Make sure Lab Only appointment is at about 1 week before your next appointment, so that results will be available  For Lab Results, once available within 2-3 days of blood draw, you can can log in to MyChart online to view your results and a brief explanation. Also, we can discuss results at next follow-up visit.   Please schedule a Follow-up Appointment to: Return if symptoms worsen or fail to improve.  If you have any other questions or concerns, please feel free to call the office or send a message through MyChart. You may also schedule an earlier appointment if necessary.  Additionally, you may be receiving a survey about your experience at our office within a few days to 1 week by e-mail or mail. We value your feedback.  Saralyn Pilar, DO [facility], [place]

## 2023-07-25 ENCOUNTER — Other Ambulatory Visit: Payer: Medicaid Other

## 2023-07-25 DIAGNOSIS — E538 Deficiency of other specified B group vitamins: Secondary | ICD-10-CM | POA: Diagnosis not present

## 2023-07-25 DIAGNOSIS — E039 Hypothyroidism, unspecified: Secondary | ICD-10-CM | POA: Diagnosis not present

## 2023-07-25 DIAGNOSIS — E785 Hyperlipidemia, unspecified: Secondary | ICD-10-CM | POA: Diagnosis not present

## 2023-07-25 DIAGNOSIS — E559 Vitamin D deficiency, unspecified: Secondary | ICD-10-CM | POA: Diagnosis not present

## 2023-07-25 DIAGNOSIS — R7303 Prediabetes: Secondary | ICD-10-CM | POA: Diagnosis not present

## 2023-07-26 LAB — COMPREHENSIVE METABOLIC PANEL
AG Ratio: 1.4 (calc) (ref 1.0–2.5)
ALT: 10 U/L (ref 6–29)
AST: 14 U/L (ref 10–35)
Albumin: 4.2 g/dL (ref 3.6–5.1)
Alkaline phosphatase (APISO): 57 U/L (ref 37–153)
BUN: 13 mg/dL (ref 7–25)
CO2: 25 mmol/L (ref 20–32)
Calcium: 9.5 mg/dL (ref 8.6–10.4)
Chloride: 107 mmol/L (ref 98–110)
Creat: 0.63 mg/dL (ref 0.50–1.05)
Globulin: 3.1 g/dL (ref 1.9–3.7)
Glucose, Bld: 90 mg/dL (ref 65–99)
Potassium: 4 mmol/L (ref 3.5–5.3)
Sodium: 140 mmol/L (ref 135–146)
Total Bilirubin: 0.4 mg/dL (ref 0.2–1.2)
Total Protein: 7.3 g/dL (ref 6.1–8.1)

## 2023-07-26 LAB — CBC WITH DIFFERENTIAL/PLATELET
Absolute Monocytes: 293 {cells}/uL (ref 200–950)
Basophils Absolute: 19 {cells}/uL (ref 0–200)
Basophils Relative: 0.4 %
Eosinophils Absolute: 149 {cells}/uL (ref 15–500)
Eosinophils Relative: 3.1 %
HCT: 37.9 % (ref 35.0–45.0)
Hemoglobin: 12.3 g/dL (ref 11.7–15.5)
Lymphs Abs: 2021 {cells}/uL (ref 850–3900)
MCH: 28.8 pg (ref 27.0–33.0)
MCHC: 32.5 g/dL (ref 32.0–36.0)
MCV: 88.8 fL (ref 80.0–100.0)
MPV: 9.9 fL (ref 7.5–12.5)
Monocytes Relative: 6.1 %
Neutro Abs: 2318 {cells}/uL (ref 1500–7800)
Neutrophils Relative %: 48.3 %
Platelets: 222 10*3/uL (ref 140–400)
RBC: 4.27 10*6/uL (ref 3.80–5.10)
RDW: 13.1 % (ref 11.0–15.0)
Total Lymphocyte: 42.1 %
WBC: 4.8 10*3/uL (ref 3.8–10.8)

## 2023-07-26 LAB — TSH+FREE T4: TSH W/REFLEX TO FT4: 2.4 m[IU]/L (ref 0.40–4.50)

## 2023-07-26 LAB — VITAMIN D 25 HYDROXY (VIT D DEFICIENCY, FRACTURES): Vit D, 25-Hydroxy: 50 ng/mL (ref 30–100)

## 2023-07-26 LAB — HEMOGLOBIN A1C
Hgb A1c MFr Bld: 5.8 %{Hb} — ABNORMAL HIGH (ref <5.7)
Mean Plasma Glucose: 120 mg/dL
eAG (mmol/L): 6.6 mmol/L

## 2023-07-26 LAB — LIPID PANEL
Cholesterol: 164 mg/dL (ref <200)
HDL: 44 mg/dL — ABNORMAL LOW (ref >=50)
LDL Cholesterol (Calc): 101 mg/dL — ABNORMAL HIGH
Non-HDL Cholesterol (Calc): 120 mg/dL (ref <130)
Total CHOL/HDL Ratio: 3.7 (calc) (ref <5.0)
Triglycerides: 98 mg/dL (ref <150)

## 2023-07-26 LAB — VITAMIN B12: Vitamin B-12: 688 pg/mL (ref 200–1100)

## 2023-07-29 DIAGNOSIS — Z419 Encounter for procedure for purposes other than remedying health state, unspecified: Secondary | ICD-10-CM | POA: Diagnosis not present

## 2023-08-05 ENCOUNTER — Encounter: Payer: Self-pay | Admitting: Family Medicine

## 2023-08-05 DIAGNOSIS — E538 Deficiency of other specified B group vitamins: Secondary | ICD-10-CM

## 2023-08-05 DIAGNOSIS — E039 Hypothyroidism, unspecified: Secondary | ICD-10-CM

## 2023-08-05 DIAGNOSIS — E785 Hyperlipidemia, unspecified: Secondary | ICD-10-CM

## 2023-08-05 DIAGNOSIS — J3089 Other allergic rhinitis: Secondary | ICD-10-CM

## 2023-08-08 ENCOUNTER — Other Ambulatory Visit: Payer: Self-pay | Admitting: Family Medicine

## 2023-08-08 MED ORDER — FLUTICASONE PROPIONATE 50 MCG/ACT NA SUSP
2.0000 | Freq: Every day | NASAL | 3 refills | Status: AC
Start: 2023-08-08 — End: ?

## 2023-08-08 MED ORDER — CYANOCOBALAMIN 1000 MCG/ML IJ SOLN: 1000.0000 ug | INTRAMUSCULAR | 3 refills | Status: DC

## 2023-08-08 MED ORDER — ROSUVASTATIN CALCIUM 10 MG PO TABS
10.0000 mg | ORAL_TABLET | Freq: Every day | ORAL | 3 refills | Status: DC
Start: 2023-08-08 — End: 2024-07-07

## 2023-08-08 MED ORDER — LEVOTHYROXINE SODIUM 75 MCG PO TABS
75.0000 ug | ORAL_TABLET | Freq: Every day | ORAL | 3 refills | Status: DC
Start: 2023-08-08 — End: 2024-07-07

## 2023-08-13 NOTE — Progress Notes (Unsigned)
PCP: Smitty Cords, DO   No chief complaint on file.   HPI:      [name] is a 62 y.o. G1P1001 whose LMP was No LMP recorded. Patient has had a hysterectomy., presents today for her annual examination.  Her menses are absent due to TAH??/ hyst She {does:18564} have vasomotor sx.   Sex activity: {sex active: 315163}. She {does:18564} have vaginal dryness.  Last Pap: {HQIO:[id number]}  Results were: {norm/abn:16707::"no abnormalities"} /neg HPV DNA.  Hx of STDs: {STD hx:14358}  Last mammogram: 10/17/22 with PCP;  Results were: normal--routine follow-up in 12 months Neg breast bx 5/23 with Dr. Lemar Livings There is no FH of breast cancer. There is no FH of ovarian cancer. The patient {does:18564} do self-breast exams.  Colonoscopy: 5/22 with Dr. Servando Snare; Repeat due after 10*** years.   Tobacco use: {tob:20664} Alcohol use: {Alcohol:11675} No drug use Exercise: {exercise:31265}  She {does:18564} get adequate calcium and Vitamin D in her diet.  Labs with PCP.   Patient Active Problem List   Diagnosis Date Noted   Other chest pain 08/02/2021   Chest pain 08/02/2021   Hx of colonic polyps    Hyperlipidemia, unspecified 08/29/2018   IGT (impaired glucose tolerance) 08/29/2018   Intrinsic atopic dermatitis 08/29/2018   History of adenomatous polyp of colon 04/01/2018   Elevated liver function tests 01/18/2018   Weight loss 01/15/2017   Pain in limb 12/18/2016   Calculus of gallbladder without cholecystitis without obstruction 10/23/2016   Chronic venous insufficiency 10/16/2016   Varicose veins of both lower extremities with pain 10/16/2016   Lymphedema 10/16/2016   Epigastric pain 10/03/2016   Barrett's esophagus determined by endoscopy 01/25/2016   Vitamin B12 deficiency 10/18/2015   Chronic neck and back pain 08/19/2015   Allergic rhinitis with postnasal drip 07/09/2015   Dyslipidemia 05/05/2015   Acquired hypothyroidism 05/05/2015   Carcinoid tumor  05/05/2015   History of malignant carcinoid tumor of stomach 03/26/2012    Past Surgical History:  Procedure Laterality Date   ABDOMINAL HYSTERECTOMY     APPENDECTOMY     CHOLECYSTECTOMY N/A 11/16/2016   Procedure: LAPAROSCOPIC CHOLECYSTECTOMY WITH INTRAOPERATIVE CHOLANGIOGRAM;  Surgeon: Nadeen Landau, MD;  Location: [facility] ORS;  Service: General;  Laterality: N/A;   COLONOSCOPY WITH PROPOFOL N/A 04/21/2021   Procedure: COLONOSCOPY WITH PROPOFOL;  Surgeon: Midge Minium, MD;  Location: [facility] ENDOSCOPY;  Service: Endoscopy;  Laterality: N/A;   DIAGNOSTIC LAPAROSCOPY     ESOPHAGOGASTRODUODENOSCOPY     TONSILLECTOMY     VEIN LIGATION AND STRIPPING      Family History  Problem Relation Age of Onset   Clotting disorder Mother    Hyperlipidemia Father    Breast cancer Neg Hx     Social History   Socioeconomic History   Marital status: Married    Spouse name: Not on file   Number of children: 1   Years of education: Not on file   Highest education level: Not on file  Occupational History   Not on file  Tobacco Use   Smoking status: Never   Smokeless tobacco: Never  Vaping Use   Vaping status: Never Used  Substance and Sexual Activity   Alcohol use: No    Alcohol/week: 0.0 standard drinks of alcohol   Drug use: No   Sexual activity: Not Currently    Birth control/protection: None, Surgical    Comment: Hysterectomy  Other Topics Concern   Not on file  Social History Narrative   Not  on file   Social Determinants of Health   Financial Resource Strain: Not on file  Food Insecurity: Not on file  Transportation Needs: Not on file  Physical Activity: Not on file  Stress: Not on file  Social Connections: Not on file  Intimate Partner Violence: Not on file     Current Outpatient Medications:    cetirizine (ZYRTEC) 10 MG tablet, Take 10 mg by mouth daily., Disp: , Rfl:    cyanocobalamin (VITAMIN B12) 1000 MCG/ML injection, Inject 1 mL (1,000 mcg total) into the muscle  every 30 (thirty) days., Disp: 3 mL, Rfl: 3   Cyanocobalamin 1000 MCG TBCR, Take by mouth., Disp: , Rfl:    EPINEPHrine 0.3 mg/0.3 mL IJ SOAJ injection, Inject into the muscle. (Patient not taking: Reported on 07/24/2023), Disp: , Rfl:    fluticasone (FLONASE) 50 MCG/ACT nasal spray, Place 2 sprays into both nostrils daily., Disp: 48 g, Rfl: 3   levothyroxine (SYNTHROID) 75 MCG tablet, Take 1 tablet (75 mcg total) by mouth daily before breakfast., Disp: 90 tablet, Rfl: 3   Multiple Vitamin (MULTI-VITAMINS) TABS, Take 1 tablet by mouth daily. , Disp: , Rfl:    rosuvastatin (CRESTOR) 10 MG tablet, Take 1 tablet (10 mg total) by mouth daily., Disp: 90 tablet, Rfl: 3     ROS:  Review of Systems BREAST: No symptoms    Objective: There were no vitals taken for this visit.   OBGyn Exam  Results: No results found for this or any previous visit (from the past 24 hour(s)).  Assessment/Plan:  No diagnosis found.   No orders of the defined types were placed in this encounter.           GYN counsel {counseling: 16159}    F/U  No follow-ups on file.  Ave Scharnhorst B. Debanhi Blaker, PA-C 08/13/2023 8:09 PM

## 2023-08-14 ENCOUNTER — Ambulatory Visit (INDEPENDENT_AMBULATORY_CARE_PROVIDER_SITE_OTHER): Payer: Medicaid Other | Admitting: Obstetrics and Gynecology

## 2023-08-14 ENCOUNTER — Encounter: Payer: Self-pay | Admitting: Obstetrics and Gynecology

## 2023-08-14 VITALS — BP 106/70 | Ht 65.0 in | Wt 175.0 lb

## 2023-08-14 DIAGNOSIS — R102 Pelvic and perineal pain: Secondary | ICD-10-CM

## 2023-08-14 DIAGNOSIS — Z01419 Encounter for gynecological examination (general) (routine) without abnormal findings: Secondary | ICD-10-CM

## 2023-08-14 DIAGNOSIS — Z1231 Encounter for screening mammogram for malignant neoplasm of breast: Secondary | ICD-10-CM

## 2023-08-14 DIAGNOSIS — Z1211 Encounter for screening for malignant neoplasm of colon: Secondary | ICD-10-CM

## 2023-08-14 NOTE — Patient Instructions (Addendum)
I value your feedback and you entrusting [place] with your care. If you get a [facility] patient survey, I would appreciate you taking the time to let us know about your experience today. Thank you!  [facility] (/[place])--[phone]

## 2023-08-28 DIAGNOSIS — Z419 Encounter for procedure for purposes other than remedying health state, unspecified: Secondary | ICD-10-CM | POA: Diagnosis not present

## 2023-09-28 DIAGNOSIS — Z419 Encounter for procedure for purposes other than remedying health state, unspecified: Secondary | ICD-10-CM | POA: Diagnosis not present

## 2023-10-28 DIAGNOSIS — Z419 Encounter for procedure for purposes other than remedying health state, unspecified: Secondary | ICD-10-CM | POA: Diagnosis not present

## 2023-11-02 ENCOUNTER — Encounter: Payer: Self-pay | Admitting: Family Medicine

## 2023-11-02 DIAGNOSIS — L2084 Intrinsic (allergic) eczema: Secondary | ICD-10-CM

## 2023-11-07 ENCOUNTER — Ambulatory Visit
Admission: RE | Admit: 2023-11-07 | Discharge: 2023-11-07 | Disposition: A | Discharge status: Home / Self Care | Payer: Medicaid Other | Source: Ambulatory Visit | Attending: Obstetrics and Gynecology | Admitting: Obstetrics and Gynecology

## 2023-11-07 DIAGNOSIS — Z1231 Encounter for screening mammogram for malignant neoplasm of breast: Secondary | ICD-10-CM | POA: Insufficient documentation

## 2023-11-28 DIAGNOSIS — Z419 Encounter for procedure for purposes other than remedying health state, unspecified: Secondary | ICD-10-CM | POA: Diagnosis not present

## 2023-12-29 DIAGNOSIS — Z419 Encounter for procedure for purposes other than remedying health state, unspecified: Secondary | ICD-10-CM | POA: Diagnosis not present

## 2023-12-31 ENCOUNTER — Encounter: Payer: Self-pay | Admitting: Family Medicine

## 2023-12-31 DIAGNOSIS — I83813 Varicose veins of bilateral lower extremities with pain: Secondary | ICD-10-CM

## 2023-12-31 DIAGNOSIS — I872 Venous insufficiency (chronic) (peripheral): Secondary | ICD-10-CM

## 2023-12-31 DIAGNOSIS — I890 Lymphedema, not elsewhere classified: Secondary | ICD-10-CM

## 2023-12-31 DIAGNOSIS — L600 Ingrowing nail: Secondary | ICD-10-CM

## 2024-01-07 DIAGNOSIS — L400 Psoriasis vulgaris: Secondary | ICD-10-CM | POA: Diagnosis not present

## 2024-01-15 ENCOUNTER — Ambulatory Visit: Payer: Medicaid Other | Admitting: Podiatry

## 2024-01-15 DIAGNOSIS — L600 Ingrowing nail: Secondary | ICD-10-CM

## 2024-01-15 NOTE — Progress Notes (Signed)
Subjective:  Patient ID: Dana Hensley, female    DOB: 1961-04-06,  MRN: [id number]  Chief Complaint  Patient presents with   Nail Problem    Nail discomfort     63 y.o. female presents with the above complaint.  Patient presents with right hallux lateral border ingrown.  Patient states pain for touch is progressive gotten worse.  Wanted to get it evaluated.  Hurts with ambulation.  She has not seen anyone else prior to seeing me.  Pain scale is 5 out of 10 minimal however she wants to not have it removed and get numbed up.  She would like for me to do it like a slant back procedure.   Review of Systems: Negative except as noted in the HPI. Denies N/V/F/Ch.  Past Medical History:  Diagnosis Date   Allergy    Anxiety    Asthma    B12 deficiency    Barrett esophagus    Chronic atrophic gastritis    DVT, lower extremity (HCC)    Gastric polyps    HLD (hyperlipidemia)    Hypothyroidism    IGT (impaired glucose tolerance)    Neuroendocrine tumor    carcinoid tumors of the stomach, s/p extraction surgery at [facility]   PONV (postoperative nausea and vomiting)    Vitamin D deficiency     Current Outpatient Medications:    cetirizine (ZYRTEC) 10 MG tablet, Take 10 mg by mouth daily., Disp: , Rfl:    cyanocobalamin (VITAMIN B12) 1000 MCG tablet, Take by mouth., Disp: , Rfl:    cyanocobalamin (VITAMIN B12) 1000 MCG/ML injection, Inject 1 mL (1,000 mcg total) into the muscle every 30 (thirty) days., Disp: 3 mL, Rfl: 3   Cyanocobalamin 1000 MCG TBCR, Take by mouth., Disp: , Rfl:    dicyclomine (BENTYL) 10 MG capsule, Take by mouth., Disp: , Rfl:    fluticasone (FLONASE) 50 MCG/ACT nasal spray, Place 2 sprays into both nostrils daily., Disp: 48 g, Rfl: 3   levothyroxine (SYNTHROID) 75 MCG tablet, Take 1 tablet (75 mcg total) by mouth daily before breakfast., Disp: 90 tablet, Rfl: 3   Multiple Vitamin (MULTI-VITAMINS) TABS, Take 1 tablet by mouth daily. , Disp: , Rfl:    rosuvastatin  (CRESTOR) 10 MG tablet, Take 1 tablet (10 mg total) by mouth daily., Disp: 90 tablet, Rfl: 3   Tuberculin-Allergy Syringes (BD ALLERGY SYRINGE) 27G X 3/8" 1 ML MISC, every Wednesday Patient receives allergy shot "once a week"., Disp: , Rfl:   Social History   Tobacco Use  Smoking Status Never  Smokeless Tobacco Never    Allergies  Allergen Reactions   Latex Swelling    SWELLING AROUND FACE   Neomycin Other (See Comments) and Rash    Seen from allergy skin test Seen from allergy skin test   Neosporin  [Neomycin-Bacitracin Zn-Polymyx] Other (See Comments)    Seen from allergy skin test   Neomycin-Polymyxin-Gramicidin     Other reaction(s): Unknown Seen from allergy skin test   Other Other (See Comments)    THIMERSOL. THIMERSOL- seen from allergy skin test  Other reaction(s): Unknown   Objective:  There were no vitals filed for this visit. There is no height or weight on file to calculate BMI. Constitutional Well developed. Well nourished.  Vascular Dorsalis pedis pulses palpable bilaterally. Posterior tibial pulses palpable bilaterally. Capillary refill normal to all digits.  No cyanosis or clubbing noted. Pedal hair growth normal.  Neurologic Normal speech. Oriented to person, place, and time. Epicritic sensation  to light touch grossly present bilaterally.  Dermatologic Painful ingrowing nail at lateral nail borders of the hallux nail right. No other open wounds. No skin lesions.  Orthopedic: Normal joint ROM without pain or crepitus bilaterally. No visible deformities. No bony tenderness.   Radiographs: None Assessment:   No diagnosis found.  Plan:  Patient was evaluated and treated and all questions answered.  Ingrown Nail, right -Patient elected undergo slant back procedure.  I encouraged her that she will benefit from removal of the ingrown nail however she just wanted me to do a slant back procedure as she is afraid of needles.  I discussed with her that  that is completely fine if it continues to bother her we can remove the corner of the nail and do a procedure patient agrees with the plan No follow-ups on file.

## 2024-01-18 ENCOUNTER — Ambulatory Visit: Payer: Medicaid Other | Admitting: Family Medicine

## 2024-01-18 ENCOUNTER — Encounter: Payer: Self-pay | Admitting: Family Medicine

## 2024-01-18 VITALS — BP 132/78 | HR 75 | Ht 65.0 in | Wt 179.0 lb

## 2024-01-18 DIAGNOSIS — M5441 Lumbago with sciatica, right side: Secondary | ICD-10-CM

## 2024-01-18 DIAGNOSIS — M549 Dorsalgia, unspecified: Secondary | ICD-10-CM

## 2024-01-18 DIAGNOSIS — G8929 Other chronic pain: Secondary | ICD-10-CM

## 2024-01-18 DIAGNOSIS — M722 Plantar fascial fibromatosis: Secondary | ICD-10-CM | POA: Diagnosis not present

## 2024-01-18 DIAGNOSIS — J321 Chronic frontal sinusitis: Secondary | ICD-10-CM | POA: Diagnosis not present

## 2024-01-18 NOTE — Progress Notes (Signed)
Subjective:    Patient ID: Dana Hensley, female    DOB: Dec 13, 1960, 63 y.o.   MRN: [id number]  Dana Hensley is a 63 y.o. female presenting on 01/18/2024 for Sinusitis, Plantar Fasciitis, and Back Pain   HPI  Discussed the use of AI scribe software for clinical note transcription with the patient, who gave verbal consent to proceed.  History of Present Illness   Dana Hensley is a 63 year old female who presents with right-sided facial pain and leg pain.  She has been experiencing right-sided facial pain for approximately six months to a year. Initially diagnosed with an ear infection, she now experiences nighttime allergies with constant throat discharge, requiring tissues nearby. The discharge is dark green in the morning but absent during the day. Discomfort and occasional pain occur in the area in front of the ear, extending towards the jaw, sometimes causing headaches lasting half a day. She uses Fluticasone nasal spray as prescribed before bed. No regular headaches, but occasional ones last half a day.  Seasonal Allergies Spring - Followed by ENT specialist, has used Flonase, and has dry skin dermatitis / allergy, worse in winter.  - Followed by [facility] - for eczema - Followed by Optometry for eye allergy drops - She has been on allergy shots before for 7 years, but she has been off of allergy shots since loss of insurance. She will consider returning to Allergist Worse allergies seasonal  Uses over-the-counter allergy eye drops and finds neti pot sinus rinse uncomfortable. Reports a sensation of fluid in her right ear, described as feeling wet inside.  She attributes leg pain to being constantly on her feet due to increased responsibilities with her grandchildren. History of sclerotherapy for veins about three years ago, with no follow-up with a vein doctor. Describes pain in her feet, particularly in the morning, and numbness and cramping in her legs. Difficulty  standing in the morning, with legs shaking and feeling weak. 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 Advised by a chiropractor to have regular adjustments. Reports pain in her middle and lower back, exacerbated by physical activity and improved with rest.  She is currently helping her son with his children following his divorce, adding stress and increasing her physical activity.           01/18/2024   10:00 AM 07/24/2023    2:10 PM 11/26/2015   10:10 AM  Depression screen PHQ 2/9  Decreased Interest 0 0 0  Down, Depressed, Hopeless 0 0 0  PHQ - 2 Score 0 0 0  Altered sleeping 2 1   Tired, decreased energy 0 0   Change in appetite  0   Feeling bad or failure about yourself  0 0   Trouble concentrating 0 0   Moving slowly or fidgety/restless 0 0   Suicidal thoughts 0 0   PHQ-9 Score 2 1   Difficult doing work/chores Not difficult at all Not difficult at all        01/18/2024   10:00 AM 07/24/2023    2:10 PM  GAD 7 : Generalized Anxiety Score  Nervous, Anxious, on Edge 0 0  Control/stop worrying 0 0  Worry too much - different things 1 1  Trouble relaxing 1 0  Restless 0 0  Easily annoyed or irritable 0 1  Afraid - awful might happen  0 1  Total GAD 7 Score 2 3  Anxiety Difficulty  Not difficult at all    Social History   Tobacco Use   Smoking status: Never   Smokeless tobacco: Never  Vaping Use   Vaping status: Never Used  Substance Use Topics   Alcohol use: No    Alcohol/week: 0.0 standard drinks of alcohol   Drug use: No    Review of Systems Per HPI unless specifically indicated above     Objective:    BP 132/78   Pulse 75   Ht 5\' 5"  (1.651 m)   Wt 179 lb (81.2 kg)   SpO2 96%   BMI 29.79 kg/m   Wt Readings from Last 3 Encounters:  01/18/24 179 lb (81.2 kg)  08/14/23 175 lb (79.4 kg)  07/24/23 173 lb (78.5 kg)    Physical  Exam Vitals and nursing note reviewed.  Constitutional:      General: She is not in acute distress.    Appearance: She is well-developed. She is not diaphoretic.     Comments: Well-appearing, comfortable, cooperative  HENT:     Head: Normocephalic and atraumatic.     Right Ear: Tympanic membrane, ear canal and external ear normal. There is no impacted cerumen.     Left Ear: Tympanic membrane, ear canal and external ear normal. There is no impacted cerumen.  Eyes:     General:        Right eye: No discharge.        Left eye: No discharge.     Conjunctiva/sclera: Conjunctivae normal.  Neck:     Thyroid: No thyromegaly.  Cardiovascular:     Rate and Rhythm: Normal rate and regular rhythm.     Heart sounds: Normal heart sounds. No murmur heard. Pulmonary:     Effort: Pulmonary effort is normal. No respiratory distress.     Breath sounds: Normal breath sounds. No wheezing or rales.  Musculoskeletal:        General: Normal range of motion.     Cervical back: Normal range of motion and neck supple.  Lymphadenopathy:     Cervical: No cervical adenopathy.  Skin:    General: Skin is warm and dry.     Findings: No erythema or rash.  Neurological:     Mental Status: She is alert and oriented to person, place, and time.  Psychiatric:        Behavior: Behavior normal.     Comments: Well groomed, good eye contact, normal speech and thoughts     Results for orders placed or performed in visit on 07/24/23  CBC with Differential/Platelet   Collection Time: 07/25/23  8:48 AM  Result Value Ref Range   WBC 4.8 3.8 - 10.8 Thousand/uL   RBC 4.27 3.80 - 5.10 Million/uL   Hemoglobin 12.3 11.7 - 15.5 g/dL   HCT 40.9 81.1 - 91.4 %   MCV 88.8 80.0 - 100.0 fL   MCH 28.8 27.0 - 33.0 pg   MCHC 32.5 32.0 - 36.0 g/dL   RDW 78.2 95.6 - 21.3 %   Platelets 222 140 - 400 Thousand/uL   MPV 9.9 7.5 - 12.5 fL   Neutro Abs 2,318 1,500 - 7,800 cells/uL   Lymphs Abs 2,021 850 - 3,900 cells/uL   Absolute  Monocytes 293 200 - 950 cells/uL   Eosinophils Absolute 149 15 - 500 cells/uL   Basophils Absolute 19 0 - 200 cells/uL   Neutrophils Relative % 48.3 %   Total  Lymphocyte 42.1 %   Monocytes Relative 6.1 %   Eosinophils Relative 3.1 %   Basophils Relative 0.4 %  Comprehensive metabolic panel   Collection Time: 07/25/23  8:48 AM  Result Value Ref Range   Glucose, Bld 90 65 - 99 mg/dL   BUN 13 7 - 25 mg/dL   Creat 0.34 7.42 - 5.95 mg/dL   BUN/Creatinine Ratio SEE NOTE: 6 - 22 (calc)   Sodium 140 135 - 146 mmol/L   Potassium 4.0 3.5 - 5.3 mmol/L   Chloride 107 98 - 110 mmol/L   CO2 25 20 - 32 mmol/L   Calcium 9.5 8.6 - 10.4 mg/dL   Total Protein 7.3 6.1 - 8.1 g/dL   Albumin 4.2 3.6 - 5.1 g/dL   Globulin 3.1 1.9 - 3.7 g/dL (calc)   AG Ratio 1.4 1.0 - 2.5 (calc)   Total Bilirubin 0.4 0.2 - 1.2 mg/dL   Alkaline phosphatase (APISO) 57 37 - 153 U/L   AST 14 10 - 35 U/L   ALT 10 6 - 29 U/L  Lipid panel   Collection Time: 07/25/23  8:48 AM  Result Value Ref Range   Cholesterol 164 <200 mg/dL   HDL 44 (L) > OR = 50 mg/dL   Triglycerides 98 <638 mg/dL   LDL Cholesterol (Calc) 101 (H) mg/dL (calc)   Total CHOL/HDL Ratio 3.7 <5.0 (calc)   Non-HDL Cholesterol (Calc) 120 <130 mg/dL (calc)  TSH + free T4   Collection Time: 07/25/23  8:48 AM  Result Value Ref Range   TSH W/REFLEX TO FT4 2.40 0.40 - 4.50 mIU/L  Hemoglobin A1c   Collection Time: 07/25/23  8:48 AM  Result Value Ref Range   Hgb A1c MFr Bld 5.8 (H) <5.7 % of total Hgb   Mean Plasma Glucose 120 mg/dL   eAG (mmol/L) 6.6 mmol/L  Vitamin B12   Collection Time: 07/25/23  8:48 AM  Result Value Ref Range   Vitamin B-12 688 200 - 1,100 pg/mL  VITAMIN D 25 Hydroxy (Vit-D Deficiency, Fractures)   Collection Time: 07/25/23  8:48 AM  Result Value Ref Range   Vit D, 25-Hydroxy 50 30 - 100 ng/mL      Assessment & Plan:   Problem List Items Addressed This Visit   None Visit Diagnoses       Chronic frontal sinusitis    -   Primary     Chronic mid back pain       Relevant Orders   DG Thoracic Spine W/Swimmers     Chronic bilateral low back pain with right-sided sciatica       Relevant Orders   DG Lumbar Spine Complete     Plantar fasciitis, right            Sinusitis Chronic right-sided facial pain, postnasal drainage, and morning cough with dark green sputum. History of ear infection. Currently on Fluticasone nasal spray. -Continue Fluticasone nasal spray, two sprays each side at bedtime. -Consider over-the-counter oral decongestant (Sudafed) without allergy medicine. -Consider over-the-counter saline nasal mist spray. -Consider antibiotic therapy for possible deeper sinus infection. -Check with ENT regarding allergy shot therapy (resume)  Plantar Fasciitis R sided Pain in the heel, especially in the morning and after periods of rest. History of cramping in the legs. -Continue with daily arch stretching exercises. -Consider over-the-counter potassium and magnesium for cramps. Remedy w/ mustard, per AVS -Consider a tension night splint for the foot to help with morning pain. - Already has podiatry  Chronic Back Pain Chronic middle and lower back pain, worse in the morning. History of arthritis of the spine. -Consider over-the-counter Aleve for pain relief. -Order X-rays of the thoracic and lumbar spine, next week walk in for X-rays -Based on X-ray results, consider referral to a spine or back doctor.  Allergies Chronic allergies affecting skin, eyes, and respiratory system. Currently on cetirizine. -Continue cetirizine. -Consider allergy eye drops as prescribed by eye doctor. -Check with allergist regarding allergy shots.         Orders Placed This Encounter  Procedures   DG Thoracic Spine W/Swimmers    Standing Status:   Future    Expiration Date:   01/17/2025    Reason for Exam (SYMPTOM  OR DIAGNOSIS REQUIRED):   chronic mid back pain    Preferred imaging location?:   [facility] Lumbar Spine Complete    Standing Status:   Future    Expiration Date:   01/17/2025    Reason for Exam (SYMPTOM  OR DIAGNOSIS REQUIRED):   chronic lower back pain with sciatica R>L    Preferred imaging location?:   [facility]    No orders of the defined types were placed in this encounter.   Follow up plan: Return if symptoms worsen or fail to improve.   Saralyn Pilar, DO [facility] 01/18/2024, 9:29 AM

## 2024-01-18 NOTE — Patient Instructions (Addendum)
Thank you for coming to the office today.  Oral Decongestant - Sudafed - use the version behind the counter, show ID to pharmacist. Avoid the combination with allergy med so you can continue Cetirizine on your own.  Start nasal steroid Flonase 2 sprays in each nostril daily for 4-6 weeks, may repeat course seasonally or as needed  Simply Saline Nasal Mist Spray - OTC no medicine in it good to use for flushing  Check with ENT / Allergy shots for future  X-rays ordered.  Leg cramps -Potassium and Magnesium supplement  - Try spoonful of yellow mustard to relieve leg cramps or try daily to prevent the problem  - OTC natural option is Hyland's Leg Cramps (Dissolving tablet) take as needed for muscle cramps  ---------------------  Tension Night Splint at night to help stretch feet  You most likely have Plantar Fasciitis of heel / foot. - This is inflammation of the fibrous connection on the bottom of the foot, and can have small micro tears over time that become painful. It usually will have flare ups lasting days to weeks, and may come back after it heals if it is re-aggravated again. - Often there is a bone spur or arthritis of the heel bone that causes this - Also it may be caused by abnormal footwear, walking pattern or other problems  If you are experiencing an acute flare with pain, this is usually worst first thing in the morning when the plantar fascia is tight and stiff. First step out of bed is painful usually, and it may gradually improve with stretching and walking.  Recommend trial of Anti-inflammatory with Aleve OTC 220 or 250mg  tabs - take one with food and plenty of water TWICE daily every day (breakfast and dinner), for next 2 to 4 weeks, then you may take only as needed - DO NOT TAKE any ibuprofen, aleve, motrin while you are taking this medicine - It is safe to take Tylenol Ext Str 500mg  tabs - take 1 to 2 (max dose 1000mg ) every 6 hours as needed for breakthrough pain,  max 24 hour daily dose is 6 to 8 tablets or 4000mg   Recommend: - Rest / relative rest with activity modification avoid overuse / prolonged stand - Ice packs (make sure you use a towel or sock / something to protect skin)  May also try topical muscle rub, icy hot, tiger balm  Start the exercises listed below, gradually increase them as instructed. May be sore at first and hopefully will stretch out and help reduce pain later.  If not improving after 1-2 weeks on medicine, and stretching exercises and some rest - then can contact our office again for re-evaluation may consider other causes or can refer you to Orthopedic specialist to have second opinion, and consider a steroid injection.     Please schedule a Follow-up Appointment to: Return if symptoms worsen or fail to improve.  If you have any other questions or concerns, please feel free to call the office or send a message through MyChart. You may also schedule an earlier appointment if necessary.  Additionally, you may be receiving a survey about your experience at our office within a few days to 1 week by e-mail or mail. We value your feedback.  Saralyn Pilar, DO [facility], [facility]              Plantar Fascia Stretches / Exercises  See other page with pictures of each exercise.  Start with 1 or  2 of these exercises that you are most comfortable with. Do not do any exercises that cause you significant worsening pain. Some of these may cause some "stretching soreness" but it should go away after you stop the exercise, and get better over time. Gradually increase up to 3-4 exercises as tolerated.  You may begin exercising the muscles of your foot right away by gently stretching them as follows:  Stretching: Towel stretch: Sit on a hard surface with your injured leg stretched out in front of you. Loop a towel around the ball of your foot and pull the towel toward your body keeping your knee  straight. Hold this position for 15 to 30 seconds then relax. Repeat 3 times. When the towel stretch becomes to easy, you may begin doing the standing calf stretch.  Standing calf stretch: Facing a wall, put your hands against the wall at about eye level. Keep the injured leg back, the uninjured leg forward, and the heel of your injured leg on the floor. Turn your injured foot slightly inward (as if you were pigeon-toed) as you slowly lean into the wall until you feel a stretch in the back of your calf. Hold for 15 to 30 seconds. Repeat 3 times. Do this exercise several times each day. When you can stand comfortably on your injured foot, you can begin stretching the bottom of your foot using the plantar fascia stretch.  Plantar fascia stretch: Stand with the ball of your injured foot on a stair. Reach for the bottom step with your heel until you feel a stretch in the arch of your foot. Hold this position for 15 to 30 seconds and then relax. Repeat 3 times. After you have stretched the bottom muscles of your foot, you can begin strengthening the top muscles of your foot.  Frozen can roll: Roll your bare injured foot back and forth from your heel to your mid-arch over a frozen juice can. Repeat for 3 to 5 minutes. This exercise is particularly helpful if done first thing in the morning. Towel pickup: With your heel on the ground, pick up a towel with your toes. Release. Repeat 10 to 20 times. When this gets easy, add more resistance by placing a book or small weight on the towel. Static and dynamic balance exercises Place a chair next to your non-injured leg and stand upright. (This will provide you with balance if needed.) Stand on your injured foot. Try to raise the arch of your foot while keeping your toes on the floor. Try to maintain this position and balance on your injured side for 30 seconds. This exercise can be made more difficult by doing it on a piece of foam or a pillow, or with your eyes  closed. Stand in the same position as above. Keep your foot in this position and reach forward in front of you with your injured side's hand, allowing your knee to bend. Repeat this 10 times while maintaining the arch height. This exercise can be made more difficult by reaching farther in front of you. Do 2 sets. Stand in the same position as above. While maintaining your arch height, reach the injured side's hand across your body toward the chair. The farther you reach, the more challenging the exercise. Do 2 sets of 10.  Next, you can begin strengthening the muscles of your foot and lower leg by using elastic tubing.  Strengthening: Resisted dorsiflexion: Sit with your injured leg out straight and your foot facing a doorway. St[place]  a loop in one end of the tubing. Put your foot through the loop so that the tubing goes around the arch of your foot. Tie a knot in the other end of the tubing and shut the knot in the door. Move backward until there is tension in the tubing. Keeping your knee straight, pull your foot toward your body, stretching the tubing. Slowly return to the starting position. Do 3 sets of 10. Resisted plantar flexion: Sit with your leg outstretched and loop the middle section of the tubing around the ball of your foot. Hold the ends of the tubing in both hands. Gently press the ball of your foot down and point your toes, stretching the tubing. Return to the starting position. Do 3 sets of 10. Resisted inversion: Sit with your legs out straight and cross your uninjured leg over your injured ankle. Wrap the tubing around the ball of your injured foot and then loop it around your uninjured foot so that the tubing is anchored there at one end. Hold the other end of the tubing in your hand. Turn your injured foot inward and upward. This will stretch the tubing. Return to the starting position. Do 3 sets of 10. Resisted eversion: Sit with both legs stretched out in front of you, with your feet  about a shoulder's width apart. Tie a loop in one end of the tubing. Put your injured foot through the loop so that the tubing goes around the arch of that foot and wraps around the outside of the uninjured foot. Hold onto the other end of the tubing with your hand to provide tension. Turn your injured foot up and out. Make sure you keep your uninjured foot still so that it will allow the tubing to stretch as you move your injured foot. Return to the starting position. Do 3 sets of 10.

## 2024-01-22 ENCOUNTER — Ambulatory Visit
Admission: RE | Admit: 2024-01-22 | Discharge: 2024-01-22 | Disposition: A | Discharge status: Home / Self Care | Payer: Medicaid Other | Attending: Family Medicine | Admitting: Family Medicine

## 2024-01-22 ENCOUNTER — Ambulatory Visit
Admission: RE | Admit: 2024-01-22 | Discharge: 2024-01-22 | Disposition: A | Discharge status: Home / Self Care | Payer: Medicaid Other | Source: Ambulatory Visit | Attending: Family Medicine | Admitting: Family Medicine

## 2024-01-22 DIAGNOSIS — M549 Dorsalgia, unspecified: Secondary | ICD-10-CM | POA: Diagnosis not present

## 2024-01-22 DIAGNOSIS — G8929 Other chronic pain: Secondary | ICD-10-CM | POA: Diagnosis not present

## 2024-01-22 DIAGNOSIS — M47816 Spondylosis without myelopathy or radiculopathy, lumbar region: Secondary | ICD-10-CM | POA: Diagnosis not present

## 2024-01-22 DIAGNOSIS — M5136 Other intervertebral disc degeneration, lumbar region with discogenic back pain only: Secondary | ICD-10-CM | POA: Diagnosis not present

## 2024-01-22 DIAGNOSIS — M5441 Lumbago with sciatica, right side: Secondary | ICD-10-CM | POA: Insufficient documentation

## 2024-01-22 DIAGNOSIS — M5134 Other intervertebral disc degeneration, thoracic region: Secondary | ICD-10-CM | POA: Diagnosis not present

## 2024-01-22 DIAGNOSIS — M5137 Other intervertebral disc degeneration, lumbosacral region with discogenic back pain only: Secondary | ICD-10-CM | POA: Diagnosis not present

## 2024-01-26 DIAGNOSIS — Z419 Encounter for procedure for purposes other than remedying health state, unspecified: Secondary | ICD-10-CM | POA: Diagnosis not present

## 2024-01-29 ENCOUNTER — Encounter: Payer: Self-pay | Admitting: Family Medicine

## 2024-01-29 DIAGNOSIS — M5134 Other intervertebral disc degeneration, thoracic region: Secondary | ICD-10-CM

## 2024-01-29 DIAGNOSIS — M51362 Other intervertebral disc degeneration, lumbar region with discogenic back pain and lower extremity pain: Secondary | ICD-10-CM

## 2024-01-29 DIAGNOSIS — G8929 Other chronic pain: Secondary | ICD-10-CM

## 2024-01-30 NOTE — Progress Notes (Signed)
 MRN : [id number]  Dana Hensley is a 63 y.o. ([birth year]) female who presents with chief complaint of legs hurt and swell.  History of Present Illness:   The patient is seen for evaluation of painful lower extremities. Patient notes the pain is variable and not always associated with activity.  She states the pain can start at the level of the foot and ankle and radiate all the way up the side to the hip.  Sometimes it feels like cramping particularly in the evening sometimes it occurs with walking but not always over the last 2 weeks to a month it has been near continuous.  There is also an element of a burning sensation.  Sometimes she notes that it occurs at night when she is laying down and sometimes it occurs when she just stands up.  The pain is somewhat consistent day to day occurring on most days. The patient notes the pain also occurs with standing or sitting for long periods.  The extremity pain routinely seems worse as the day wears on. The pain has been progressive over the past several years. The patient states these symptoms are causing a negative impact on quality of life and daily activities which was a factor in the evaluation.  The patient has a history of back problems and DJD of the lumbar and sacral spine and is currently undergoing evaluation for this as the etiology of her leg symptoms.  Patient also has a history of venous disease and venous reflux with painful varicose veins.  She is status post successful laser ablation of the left great saphenous vein December 21, 2017.  She was also treated with some sclerotherapy at that time.  No history of DVT or phlebitis.  The patient denies rest pain or dangling of an extremity off the side of the bed during the night for relief. No open wounds or sores at this time.  No prior arterial interventions or surgeries.   No outpatient medications have been marked as taking for the 02/04/24 encounter (Appointment) with Gilda Crease,  Latina Craver, MD.    Past Medical History:  Diagnosis Date   Allergy    Anxiety    Asthma    B12 deficiency    Barrett esophagus    Chronic atrophic gastritis    DVT, lower extremity (HCC)    Gastric polyps    HLD (hyperlipidemia)    Hypothyroidism    IGT (impaired glucose tolerance)    Neuroendocrine tumor    carcinoid tumors of the stomach, s/p extraction surgery at [facility]   PONV (postoperative nausea and vomiting)    Vitamin D deficiency     Past Surgical History:  Procedure Laterality Date   APPENDECTOMY     CHOLECYSTECTOMY N/A 11/16/2016   Procedure: LAPAROSCOPIC CHOLECYSTECTOMY WITH INTRAOPERATIVE CHOLANGIOGRAM;  Surgeon: Nadeen Landau, MD;  Location: [facility] ORS;  Service: General;  Laterality: N/A;   COLONOSCOPY WITH PROPOFOL N/A 04/21/2021   Procedure: COLONOSCOPY WITH PROPOFOL;  Surgeon: Midge Minium, MD;  Location: [facility];  Service: Endoscopy;  Laterality: N/A;   DIAGNOSTIC LAPAROSCOPY     ESOPHAGOGASTRODUODENOSCOPY     TONSILLECTOMY     TOTAL ABDOMINAL HYSTERECTOMY W/ BILATERAL SALPINGOOPHORECTOMY  2012   due to ovar cysts/pain   VEIN LIGATION AND STRIPPING      Social History Social History   Tobacco Use   Smoking status: Never   Smokeless tobacco: Never  Vaping Use   Vaping status: Never Used  Substance Use Topics   Alcohol use: No    Alcohol/week: 0.0 standard drinks of alcohol   Drug use: No    Family History Family History  Problem Relation Age of Onset   Clotting disorder Mother    Hyperlipidemia Father    Breast cancer Neg Hx     Allergies  Allergen Reactions   Latex Swelling    SWELLING AROUND FACE   Neomycin Other (See Comments) and Rash    Seen from allergy skin test Seen from allergy skin test   Neosporin  [Neomycin-Bacitracin Zn-Polymyx] Other (See Comments)    Seen from allergy skin test   Neomycin-Polymyxin-Gramicidin     Other reaction(s): Unknown Seen from allergy skin test   Other Other (See Comments)     THIMERSOL. THIMERSOL- seen from allergy skin test  Other reaction(s): Unknown     REVIEW OF SYSTEMS (Negative unless checked)  Constitutional: [] Weight loss  [] Fever  [] Chills Cardiac: [] Chest pain   [] Chest pressure   [] Palpitations   [] Shortness of breath when laying flat   [] Shortness of breath with exertion. Vascular:  [] Pain in legs with walking   [x] Pain in legs at rest  [] History of DVT   [] Phlebitis   [x] Swelling in legs   [] Varicose veins   [] Non-healing ulcers Pulmonary:   [] Uses home oxygen   [] Productive cough   [] Hemoptysis   [] Wheeze  [] COPD   [] Asthma Neurologic:  [] Dizziness   [] Seizures   [] History of stroke   [] History of TIA  [] Aphasia   [] Vissual changes   [] Weakness or numbness in arm   [] Weakness or numbness in leg Musculoskeletal:   [] Joint swelling   [] Joint pain   [] Low back pain Hematologic:  [] Easy bruising  [] Easy bleeding   [] Hypercoagulable state   [] Anemic Gastrointestinal:  [] Diarrhea   [] Vomiting  [] Gastroesophageal reflux/heartburn   [] Difficulty swallowing. Genitourinary:  [] Chronic kidney disease   [] Difficult urination  [] Frequent urination   [] Blood in urine Skin:  [] Rashes   [] Ulcers  Psychological:  [] History of anxiety   []  History of major depression.  Physical Examination  There were no vitals filed for this visit. There is no height or weight on file to calculate BMI. Gen: WD/WN, NAD Head: [place]/AT, No temporalis wasting.  Ear/Nose/Throat: Hearing grossly intact, nares w/o erythema or drainage, pinna without lesions Eyes: PER, EOMI, sclera nonicteric.  Neck: Supple, no gross masses.  No JVD.  Pulmonary:  Good air movement, no audible wheezing, no use of accessory muscles.  Cardiac: RRR, precordium not hyperdynamic. Vascular: Numerous large greater than 10 mm varicosities present bilaterally which are tender to palpation.  Moderate venous stasis changes to the legs bilaterally.  2+ soft pitting edema. CEAP C4sEpAsPr   Vessel Right Left  Radial  Palpable Palpable  PT Not palpable Not palpable  DP Not palpable Not palpable  Gastrointestinal: soft, non-distended. No guarding/no peritoneal signs.  Musculoskeletal: M/S 5/5 throughout.  No deformity.  Neurologic: CN 2-12 intact. Pain and light touch intact in extremities.  Symmetrical.  Speech is fluent. Motor exam as listed above. Psychiatric: Judgment intact, Mood & affect appropriate for pt's clinical situation. Dermatologic: Venous rashes no ulcers noted.  No changes consistent with cellulitis. Lymph : No lichenification or skin changes of chronic lymphedema.  CBC Lab Results  Component Value Date   WBC 4.8 07/25/2023   HGB 12.3 07/25/2023   HCT 37.9 07/25/2023   MCV 88.8 07/25/2023   PLT 222 07/25/2023    BMET    Component Value  Date/Time   NA 140 07/25/2023 0848   NA 142 10/26/2015 0947   K 4.0 07/25/2023 0848   CL 107 07/25/2023 0848   CO2 25 07/25/2023 0848   GLUCOSE 90 07/25/2023 0848   BUN 13 07/25/2023 0848   BUN 14 10/26/2015 0947   CREATININE 0.63 07/25/2023 0848   CALCIUM 9.5 07/25/2023 0848   GFRNONAA >60 08/03/2021 0610   GFRAA >60 04/16/2017 1420   CrCl cannot be calculated (Patient's most recent lab result is older than the maximum 21 days allowed.).  COAG No results found for: "INR", "PROTIME"  Radiology No results found.   Assessment/Plan 1. Pain in both lower extremities  Recommend:  The patient has atypical pain symptoms for pure atherosclerotic disease. However, on physical exam there is evidence of vascular disease, given the diminished pulses and the edema of the legs.  Further investigation of the patient's vascular disease is necessary to determine the relationship of the patient's lower extremity symptoms and the degree of vascular disease.  Noninvasive studies of the will be obtained and the patient will follow up with me to review these studies.  I suspect the patient is c/o pseudoclaudication.  Patient should have an evaluation  of his LS spine which I defer to either the primary service, pain management or the Spine service.  The patient should continue walking and begin a more formal exercise program. The patient should continue his antiplatelet therapy and aggressive treatment of the lipid abnormalities. - VAS [place] ABI WITH/WO TBI; Future - VAS [place] LOWER EXTREMITY VENOUS REFLUX; Future  2. Chronic venous insufficiency (Primary)  Recommend:  The patient has atypical pain symptoms for pure atherosclerotic disease. However, on physical exam there is evidence of vascular disease, given the diminished pulses and the edema of the legs.  Further investigation of the patient's vascular disease is necessary to determine the relationship of the patient's lower extremity symptoms and the degree of vascular disease.  Noninvasive studies of the will be obtained and the patient will follow up with me to review these studies.  I suspect the patient is c/o pseudoclaudication.  Patient should have an evaluation of his LS spine which I defer to either the primary service, pain management or the Spine service.  The patient should continue walking and begin a more formal exercise program. The patient should continue his antiplatelet therapy and aggressive treatment of the lipid abnormalities. - VAS [place] LOWER EXTREMITY VENOUS REFLUX; Future  3. Lymphedema Recommend:  I have had a long discussion with the patient regarding swelling and why it  causes symptoms.  Patient will begin wearing graduated compression on a daily basis a prescription was given. The patient will  wear the stockings first thing in the morning and removing them in the evening. The patient is instructed specifically not to sleep in the stockings.   In addition, behavioral modification will be initiated.  This will include frequent elevation, use of over the counter pain medications and exercise such as walking.  Consideration for a lymph pump will also be made  based upon the effectiveness of conservative therapy.  This would help to improve the edema control and prevent sequela such as ulcers and infections   Patient should undergo duplex ultrasound of the venous system to ensure that DVT or reflux is not present.  The patient will follow-up with me after the ultrasound.   4. Dyslipidemia Continue statin as ordered and reviewed, no changes at this time    Levora Dredge, MD  01/30/2024 1:14 PM

## 2024-02-04 ENCOUNTER — Ambulatory Visit (INDEPENDENT_AMBULATORY_CARE_PROVIDER_SITE_OTHER): Payer: Medicaid Other | Admitting: Vascular Surgery

## 2024-02-04 ENCOUNTER — Encounter (INDEPENDENT_AMBULATORY_CARE_PROVIDER_SITE_OTHER): Payer: Self-pay | Admitting: Vascular Surgery

## 2024-02-04 VITALS — BP 114/74 | HR 68 | Resp 16 | Wt 181.6 lb

## 2024-02-04 DIAGNOSIS — E785 Hyperlipidemia, unspecified: Secondary | ICD-10-CM | POA: Diagnosis not present

## 2024-02-04 DIAGNOSIS — M79604 Pain in right leg: Secondary | ICD-10-CM | POA: Diagnosis not present

## 2024-02-04 DIAGNOSIS — M79605 Pain in left leg: Secondary | ICD-10-CM

## 2024-02-04 DIAGNOSIS — I872 Venous insufficiency (chronic) (peripheral): Secondary | ICD-10-CM | POA: Diagnosis not present

## 2024-02-04 DIAGNOSIS — I890 Lymphedema, not elsewhere classified: Secondary | ICD-10-CM

## 2024-02-04 DIAGNOSIS — M79606 Pain in leg, unspecified: Secondary | ICD-10-CM | POA: Insufficient documentation

## 2024-02-08 ENCOUNTER — Encounter: Payer: Self-pay | Admitting: Family Medicine

## 2024-02-08 NOTE — Addendum Note (Signed)
 Addended by: Smitty Cords on: 02/08/2024 06:36 PM   Modules accepted: Orders

## 2024-02-12 DIAGNOSIS — M5416 Radiculopathy, lumbar region: Secondary | ICD-10-CM | POA: Diagnosis not present

## 2024-02-12 DIAGNOSIS — M9904 Segmental and somatic dysfunction of sacral region: Secondary | ICD-10-CM | POA: Diagnosis not present

## 2024-02-12 DIAGNOSIS — M9901 Segmental and somatic dysfunction of cervical region: Secondary | ICD-10-CM | POA: Diagnosis not present

## 2024-02-12 DIAGNOSIS — M9903 Segmental and somatic dysfunction of lumbar region: Secondary | ICD-10-CM | POA: Diagnosis not present

## 2024-02-12 DIAGNOSIS — M9902 Segmental and somatic dysfunction of thoracic region: Secondary | ICD-10-CM | POA: Diagnosis not present

## 2024-02-12 DIAGNOSIS — M5418 Radiculopathy, sacral and sacrococcygeal region: Secondary | ICD-10-CM | POA: Diagnosis not present

## 2024-02-12 DIAGNOSIS — M5412 Radiculopathy, cervical region: Secondary | ICD-10-CM | POA: Diagnosis not present

## 2024-02-12 DIAGNOSIS — M546 Pain in thoracic spine: Secondary | ICD-10-CM | POA: Diagnosis not present

## 2024-02-14 DIAGNOSIS — H5213 Myopia, bilateral: Secondary | ICD-10-CM | POA: Diagnosis not present

## 2024-02-15 DIAGNOSIS — M9901 Segmental and somatic dysfunction of cervical region: Secondary | ICD-10-CM | POA: Diagnosis not present

## 2024-02-15 DIAGNOSIS — M9903 Segmental and somatic dysfunction of lumbar region: Secondary | ICD-10-CM | POA: Diagnosis not present

## 2024-02-15 DIAGNOSIS — M546 Pain in thoracic spine: Secondary | ICD-10-CM | POA: Diagnosis not present

## 2024-02-15 DIAGNOSIS — M9902 Segmental and somatic dysfunction of thoracic region: Secondary | ICD-10-CM | POA: Diagnosis not present

## 2024-02-15 DIAGNOSIS — M5418 Radiculopathy, sacral and sacrococcygeal region: Secondary | ICD-10-CM | POA: Diagnosis not present

## 2024-02-15 DIAGNOSIS — M5412 Radiculopathy, cervical region: Secondary | ICD-10-CM | POA: Diagnosis not present

## 2024-02-15 DIAGNOSIS — M5416 Radiculopathy, lumbar region: Secondary | ICD-10-CM | POA: Diagnosis not present

## 2024-02-15 DIAGNOSIS — M9904 Segmental and somatic dysfunction of sacral region: Secondary | ICD-10-CM | POA: Diagnosis not present

## 2024-02-18 DIAGNOSIS — M9901 Segmental and somatic dysfunction of cervical region: Secondary | ICD-10-CM | POA: Diagnosis not present

## 2024-02-18 DIAGNOSIS — M546 Pain in thoracic spine: Secondary | ICD-10-CM | POA: Diagnosis not present

## 2024-02-18 DIAGNOSIS — M9904 Segmental and somatic dysfunction of sacral region: Secondary | ICD-10-CM | POA: Diagnosis not present

## 2024-02-18 DIAGNOSIS — M9902 Segmental and somatic dysfunction of thoracic region: Secondary | ICD-10-CM | POA: Diagnosis not present

## 2024-02-18 DIAGNOSIS — M9903 Segmental and somatic dysfunction of lumbar region: Secondary | ICD-10-CM | POA: Diagnosis not present

## 2024-02-18 DIAGNOSIS — M5418 Radiculopathy, sacral and sacrococcygeal region: Secondary | ICD-10-CM | POA: Diagnosis not present

## 2024-02-18 DIAGNOSIS — M5416 Radiculopathy, lumbar region: Secondary | ICD-10-CM | POA: Diagnosis not present

## 2024-02-18 DIAGNOSIS — M5412 Radiculopathy, cervical region: Secondary | ICD-10-CM | POA: Diagnosis not present

## 2024-02-20 DIAGNOSIS — M546 Pain in thoracic spine: Secondary | ICD-10-CM | POA: Diagnosis not present

## 2024-02-20 DIAGNOSIS — M9901 Segmental and somatic dysfunction of cervical region: Secondary | ICD-10-CM | POA: Diagnosis not present

## 2024-02-20 DIAGNOSIS — M9902 Segmental and somatic dysfunction of thoracic region: Secondary | ICD-10-CM | POA: Diagnosis not present

## 2024-02-20 DIAGNOSIS — M5418 Radiculopathy, sacral and sacrococcygeal region: Secondary | ICD-10-CM | POA: Diagnosis not present

## 2024-02-20 DIAGNOSIS — M9904 Segmental and somatic dysfunction of sacral region: Secondary | ICD-10-CM | POA: Diagnosis not present

## 2024-02-20 DIAGNOSIS — M9903 Segmental and somatic dysfunction of lumbar region: Secondary | ICD-10-CM | POA: Diagnosis not present

## 2024-02-20 DIAGNOSIS — M5412 Radiculopathy, cervical region: Secondary | ICD-10-CM | POA: Diagnosis not present

## 2024-02-20 DIAGNOSIS — M5416 Radiculopathy, lumbar region: Secondary | ICD-10-CM | POA: Diagnosis not present

## 2024-02-26 DIAGNOSIS — M9901 Segmental and somatic dysfunction of cervical region: Secondary | ICD-10-CM | POA: Diagnosis not present

## 2024-02-26 DIAGNOSIS — M5416 Radiculopathy, lumbar region: Secondary | ICD-10-CM | POA: Diagnosis not present

## 2024-02-26 DIAGNOSIS — M9903 Segmental and somatic dysfunction of lumbar region: Secondary | ICD-10-CM | POA: Diagnosis not present

## 2024-02-26 DIAGNOSIS — M5412 Radiculopathy, cervical region: Secondary | ICD-10-CM | POA: Diagnosis not present

## 2024-02-26 DIAGNOSIS — M5418 Radiculopathy, sacral and sacrococcygeal region: Secondary | ICD-10-CM | POA: Diagnosis not present

## 2024-02-26 DIAGNOSIS — M9902 Segmental and somatic dysfunction of thoracic region: Secondary | ICD-10-CM | POA: Diagnosis not present

## 2024-02-26 DIAGNOSIS — M546 Pain in thoracic spine: Secondary | ICD-10-CM | POA: Diagnosis not present

## 2024-02-26 DIAGNOSIS — M9904 Segmental and somatic dysfunction of sacral region: Secondary | ICD-10-CM | POA: Diagnosis not present

## 2024-02-27 ENCOUNTER — Encounter: Payer: Self-pay | Admitting: Family Medicine

## 2024-02-27 DIAGNOSIS — Z85020 Personal history of malignant carcinoid tumor of stomach: Secondary | ICD-10-CM

## 2024-02-28 DIAGNOSIS — H5213 Myopia, bilateral: Secondary | ICD-10-CM | POA: Diagnosis not present

## 2024-02-29 DIAGNOSIS — M546 Pain in thoracic spine: Secondary | ICD-10-CM | POA: Diagnosis not present

## 2024-02-29 DIAGNOSIS — M5416 Radiculopathy, lumbar region: Secondary | ICD-10-CM | POA: Diagnosis not present

## 2024-02-29 DIAGNOSIS — M5412 Radiculopathy, cervical region: Secondary | ICD-10-CM | POA: Diagnosis not present

## 2024-02-29 DIAGNOSIS — M9901 Segmental and somatic dysfunction of cervical region: Secondary | ICD-10-CM | POA: Diagnosis not present

## 2024-02-29 DIAGNOSIS — M9902 Segmental and somatic dysfunction of thoracic region: Secondary | ICD-10-CM | POA: Diagnosis not present

## 2024-02-29 DIAGNOSIS — M5418 Radiculopathy, sacral and sacrococcygeal region: Secondary | ICD-10-CM | POA: Diagnosis not present

## 2024-02-29 DIAGNOSIS — M9903 Segmental and somatic dysfunction of lumbar region: Secondary | ICD-10-CM | POA: Diagnosis not present

## 2024-02-29 DIAGNOSIS — M9904 Segmental and somatic dysfunction of sacral region: Secondary | ICD-10-CM | POA: Diagnosis not present

## 2024-03-03 DIAGNOSIS — M546 Pain in thoracic spine: Secondary | ICD-10-CM | POA: Diagnosis not present

## 2024-03-03 DIAGNOSIS — M9901 Segmental and somatic dysfunction of cervical region: Secondary | ICD-10-CM | POA: Diagnosis not present

## 2024-03-03 DIAGNOSIS — M5416 Radiculopathy, lumbar region: Secondary | ICD-10-CM | POA: Diagnosis not present

## 2024-03-03 DIAGNOSIS — M5418 Radiculopathy, sacral and sacrococcygeal region: Secondary | ICD-10-CM | POA: Diagnosis not present

## 2024-03-03 DIAGNOSIS — M5412 Radiculopathy, cervical region: Secondary | ICD-10-CM | POA: Diagnosis not present

## 2024-03-03 DIAGNOSIS — M9903 Segmental and somatic dysfunction of lumbar region: Secondary | ICD-10-CM | POA: Diagnosis not present

## 2024-03-03 DIAGNOSIS — M9902 Segmental and somatic dysfunction of thoracic region: Secondary | ICD-10-CM | POA: Diagnosis not present

## 2024-03-03 DIAGNOSIS — M9904 Segmental and somatic dysfunction of sacral region: Secondary | ICD-10-CM | POA: Diagnosis not present

## 2024-03-07 NOTE — Progress Notes (Signed)
 MRN : [id number]  Dana Hensley is a 63 y.o. ([birth year]) female who presents with chief complaint of check circulation.  History of Present Illness:   The patient returns for follow-up evaluation regarding lower extremity pain.  Today she is noting the symptoms seem to be equal between the right and left lower extremities.  This at the last visit she noted the pain is variable and not always associated with activity.  She states the pain can start at the level of the foot and ankle and radiate all the way up the side to the hip.  Sometimes it feels like cramping particularly in the evening sometimes it occurs with walking but not always over the last 2 weeks to a month it has been near continuous.  She is also describing a near continuous pins-and-needles/numbness in her toes and forefoot area.  This seems to be increasing in intensity and is really her primary complaint today.  Sometimes she notes that it occurs at night when she is laying down and sometimes it occurs when she just stands up.  The pain is somewhat consistent day to day occurring on most days. The patient notes the pain also occurs with standing or sitting for long periods.  The extremity pain routinely seems worse as the day wears on. The pain has been progressive over the past several years. The patient states these symptoms are causing a negative impact on quality of life and daily activities which was a factor in the evaluation.   The patient has a history of back problems and DJD of the lumbar and sacral spine but is not currently undergoing evaluation for this as the etiology of her leg symptoms.   Patient also has a history of venous disease and venous reflux with painful varicose veins.  She is status post successful laser ablation of the left great saphenous vein December 21, 2017.  She was also treated with some sclerotherapy at that time.  No history of DVT or  phlebitis.   The patient denies rest pain or dangling of an extremity off the side of the bed during the night for relief. No open wounds or sores at this time.   No prior arterial interventions or surgeries.  Duplex ultrasound of the venous system bilateral lower extremities today demonstrates normal deep venous system bilaterally.  Successful ablation of the left great saphenous vein is again noted.  There is reflux noted in the small saphenous vein on the right.  ABIs obtained today are normal bilaterally.  No outpatient medications have been marked as taking for the 03/10/24 encounter (Appointment) with Prescilla Brod, Ninette Basque, MD.    Past Medical History:  Diagnosis Date   Allergy    Anxiety    Asthma    B12 deficiency    Barrett esophagus    Chronic atrophic gastritis    DVT, lower extremity (HCC)    Gastric polyps    HLD (hyperlipidemia)    Hypothyroidism    IGT (impaired glucose tolerance)    Neuroendocrine tumor    carcinoid tumors of the stomach, s/p extraction surgery at [facility]   PONV (postoperative nausea and vomiting)    Vitamin D deficiency     Past Surgical History:  Procedure Laterality Date  APPENDECTOMY     CHOLECYSTECTOMY N/A 11/16/2016   Procedure: LAPAROSCOPIC CHOLECYSTECTOMY WITH INTRAOPERATIVE CHOLANGIOGRAM;  Surgeon: Nadeen Landau, MD;  Location: [facility] OR[facility];  Service: General;  Laterality: N/A;   COLONOSCOPY WITH PROPOFOL N/A 04/21/2021   Procedure: COLONOSCOPY WITH PROPOFOL;  Surgeon: Midge Minium, MD;  Location: [facility];  Service: Endoscopy;  Laterality: N/A;   DIAGNOSTIC LAPAROSCOPY     ESOPHAGOGASTRODUODENOSCOPY     TONSILLECTOMY     TOTAL ABDOMINAL HYSTERECTOMY W/ BILATERAL SALPINGOOPHORECTOMY  2012   due to ovar cysts/pain   VEIN LIGATION AND STRIPPING      Social History Social History   Tobacco Use   Smoking status: Never   Smokeless tobacco: Never  Vaping Use   Vaping status: Never Used  Substance Use Topics   Alcohol use:  No    Alcohol/week: 0.0 standard drinks of alcohol   Drug use: No    Family History Family History  Problem Relation Age of Onset   Clotting disorder Mother    Hyperlipidemia Father    Breast cancer Neg Hx     Allergies  Allergen Reactions   Latex Swelling    SWELLING AROUND FACE   Neomycin Other (See Comments) and Rash    Seen from allergy skin test Seen from allergy skin test   Neosporin  [Neomycin-Bacitracin Zn-Polymyx] Other (See Comments)    Seen from allergy skin test   Neomycin-Polymyxin-Gramicidin     Other reaction(s): Unknown Seen from allergy skin test   Other Other (See Comments)    THIMERSOL. THIMERSOL- seen from allergy skin test  Other reaction(s): Unknown     REVIEW OF SYSTEMS (Negative unless checked)  Constitutional: [] Weight loss  [] Fever  [] Chills Cardiac: [] Chest pain   [] Chest pressure   [] Palpitations   [] Shortness of breath when laying flat   [] Shortness of breath with exertion. Vascular:  [x] Pain in legs with walking   [x] Pain in legs at rest  [] History of DVT   [] Phlebitis   [] Swelling in legs   [] Varicose veins   [] Non-healing ulcers Pulmonary:   [] Uses home oxygen   [] Productive cough   [] Hemoptysis   [] Wheeze  [] COPD   [] Asthma Neurologic:  [] Dizziness   [] Seizures   [] History of stroke   [] History of TIA  [] Aphasia   [] Vissual changes   [] Weakness or numbness in arm   [] Weakness or numbness in leg Musculoskeletal:   [] Joint swelling   [x] Joint pain   [x] Low back pain Hematologic:  [] Easy bruising  [] Easy bleeding   [] Hypercoagulable state   [] Anemic Gastrointestinal:  [] Diarrhea   [] Vomiting  [] Gastroesophageal reflux/heartburn   [] Difficulty swallowing. Genitourinary:  [] Chronic kidney disease   [] Difficult urination  [] Frequent urination   [] Blood in urine Skin:  [] Rashes   [] Ulcers  Psychological:  [] History of anxiety   []  History of major depression.  Physical Examination  There were no vitals filed for this visit. There is no height  or weight on file to calculate BMI. Gen: WD/WN, NAD Head: [place]/AT, No temporalis wasting.  Ear/Nose/Throat: Hearing grossly intact, nares w/o erythema or drainage Eyes: PER, EOMI, sclera nonicteric.  Neck: Supple, no masses.  No bruit or JVD.  Pulmonary:  Good air movement, no audible wheezing, no use of accessory muscles.  Cardiac: RRR, normal S1, S2, no Murmurs. Vascular: Moderate varicosities present, greater than 8 mm right posterior calf area.  Veins are tender to palpation  Mild venous stasis changes to the legs bilaterally.  Trace soft pitting edema CEAP C3sEpAsPr Vessel  Right Left  Radial Palpable Palpable  PT Palpable  Palpable  DP Palpable  Palpable  Gastrointestinal: soft, non-distended. No guarding/no peritoneal signs.  Musculoskeletal: M/S 5/5 throughout.  No visible deformity.  Neurologic: CN 2-12 intact. Pain and light touch intact in extremities.  Symmetrical.  Speech is fluent. Motor exam as listed above. Psychiatric: Judgment intact, Mood & affect appropriate for pt's clinical situation. Dermatologic: No rashes or ulcers noted.  No changes consistent with cellulitis.   CBC Lab Results  Component Value Date   WBC 4.8 07/25/2023   HGB 12.3 07/25/2023   HCT 37.9 07/25/2023   MCV 88.8 07/25/2023   PLT 222 07/25/2023    BMET    Component Value Date/Time   NA 140 07/25/2023 0848   NA 142 10/26/2015 0947   K 4.0 07/25/2023 0848   CL 107 07/25/2023 0848   CO2 25 07/25/2023 0848   GLUCOSE 90 07/25/2023 0848   BUN 13 07/25/2023 0848   BUN 14 10/26/2015 0947   CREATININE 0.63 07/25/2023 0848   CALCIUM 9.5 07/25/2023 0848   GFRNONAA >60 08/03/2021 0610   GFRAA >60 04/16/2017 1420   CrCl cannot be calculated (Patient's most recent lab result is older than the maximum 21 days allowed.).  COAG No results found for: "INR", "PROTIME"  Radiology No results found.   Assessment/Plan 1. Pain in both lower extremities (Primary) Recommend:  The patient has atypical  pain symptoms for vascular disease and on exam I do not find evidence of vascular pathology that would explain the patient's symptoms.  Noninvasive studies do not identify significant vascular problems  I suspect the patient is c/o pseudoclaudication.  Patient should have an evaluation of the LS spine which I defer to the Spine service.  The patient should continue walking and begin a more formal exercise program. The patient should continue his antiplatelet therapy and aggressive treatment of the lipid abnormalities.  - Ambulatory referral to Neurosurgery  2. Chronic venous insufficiency The patient is certainly a candidate for laser ablation of the right small saphenous vein.  However, given that both of her lower extremities are equally painful she wishes to focus on fixing this first.  I will see her back in 6 months and we can readdress her venous disease at that time.  In the meantime she will continue elevation exercise and wearing compression on a daily basis.  3. Dyslipidemia Continue statin as ordered and reviewed, no changes at this time    Devon Fogo, MD  03/07/2024 11:08 AM

## 2024-03-08 DIAGNOSIS — Z419 Encounter for procedure for purposes other than remedying health state, unspecified: Secondary | ICD-10-CM | POA: Diagnosis not present

## 2024-03-10 ENCOUNTER — Ambulatory Visit (INDEPENDENT_AMBULATORY_CARE_PROVIDER_SITE_OTHER): Admitting: Vascular Surgery

## 2024-03-10 ENCOUNTER — Encounter (INDEPENDENT_AMBULATORY_CARE_PROVIDER_SITE_OTHER): Payer: Self-pay | Admitting: Vascular Surgery

## 2024-03-10 ENCOUNTER — Ambulatory Visit (INDEPENDENT_AMBULATORY_CARE_PROVIDER_SITE_OTHER)

## 2024-03-10 VITALS — BP 121/83 | HR 60 | Resp 18 | Ht 65.0 in | Wt 180.0 lb

## 2024-03-10 DIAGNOSIS — M79605 Pain in left leg: Secondary | ICD-10-CM

## 2024-03-10 DIAGNOSIS — I872 Venous insufficiency (chronic) (peripheral): Secondary | ICD-10-CM

## 2024-03-10 DIAGNOSIS — M79604 Pain in right leg: Secondary | ICD-10-CM

## 2024-03-10 DIAGNOSIS — E785 Hyperlipidemia, unspecified: Secondary | ICD-10-CM

## 2024-03-12 ENCOUNTER — Encounter (INDEPENDENT_AMBULATORY_CARE_PROVIDER_SITE_OTHER): Payer: Self-pay | Admitting: Vascular Surgery

## 2024-03-12 LAB — VAS US ABI WITH/WO TBI
Left ABI: 1.21
Right ABI: 1.29

## 2024-03-24 NOTE — Progress Notes (Unsigned)
 Referring Physician:  Jackquelyn Mass, MD [address] [place],  [place] [address]  Primary Physician:  Raina Bunting, DO  History of Present Illness: 03/26/2024 Ms. Dana  Hensley is here today with a chief complaint of neck and back pain.  This has been ongoing for 20 years however has become progressively worse over the past year.  Primarily the pain is in her back that radiates down the back of her left leg and goes into the bottom of her foot.  She states this pain is sharp in nature and is accompanied with numbness and tingling in her foot.  She also has numbness and tingling in her right foot without radiating pain from her back down her leg.  She describes it as she has a thick pad on the bottom of her feet and that at times she feels as though she is walking with water in her shoes.  She states it has become more difficult to sleep.  She denies any weakness or dysfunction to bowel and bladder.  She states that she is unable to take NSAIDs due to recommendations from her gastrointestinal physician.  Previously she has undergone chiropractic care which was helpful.  Duration: 1 year Severity: 6/10  Precipitating: aggravated by bending, lifting Modifying factors: made better by laying in bed Weakness: none Timing: constant Bowel/Bladder Dysfunction: none  Conservative measures: saw a chiropractor, massage therapy Physical therapy: has participated in pelvic therapy about 6 years ago, this did help Multimodal medical therapy including regular antiinflammatories: none Injections: no epidural steroid injections  Past Surgery: no spinal surgeries   Lattie  I Cupo has no symptoms of cervical myelopathy.  The symptoms are causing a significant impact on the patient's life.   Review of Systems:  A 10 point review of systems is negative, except for the pertinent positives and negatives detailed in the HPI.  Past Medical History: Past Medical History:   Diagnosis Date   Allergy    Anxiety    Asthma    B12 deficiency    Barrett esophagus    Chronic atrophic gastritis    DVT, lower extremity (HCC)    Gastric polyps    HLD (hyperlipidemia)    Hypothyroidism    IGT (impaired glucose tolerance)    Neuroendocrine tumor    carcinoid tumors of the stomach, s/p extraction surgery at [facility]   PONV (postoperative nausea and vomiting)    Vitamin D  deficiency     Past Surgical History: Past Surgical History:  Procedure Laterality Date   APPENDECTOMY     CHOLECYSTECTOMY N/A 11/16/2016   Procedure: LAPAROSCOPIC CHOLECYSTECTOMY WITH INTRAOPERATIVE CHOLANGIOGRAM;  Surgeon: Benancio Bracket, MD;  Location: [facility] ORS;  Service: General;  Laterality: N/A;   COLONOSCOPY WITH PROPOFOL  N/A 04/21/2021   Procedure: COLONOSCOPY WITH PROPOFOL ;  Surgeon: Marnee Sink, MD;  Location: [facility] ENDOSCOPY;  Service: Endoscopy;  Laterality: N/A;   DIAGNOSTIC LAPAROSCOPY     ESOPHAGOGASTRODUODENOSCOPY     TONSILLECTOMY     TOTAL ABDOMINAL HYSTERECTOMY W/ BILATERAL SALPINGOOPHORECTOMY  2012   due to ovar cysts/pain   VEIN LIGATION AND STRIPPING      Allergies: Allergies as of 03/26/2024 - Review Complete 03/26/2024  Allergen Reaction Noted   Latex Swelling 12/22/2014   Neomycin Other (See Comments) and Rash 07/09/2015   Neosporin  [neomycin-bacitracin zn-polymyx] Other (See Comments) 07/09/2015   Neomycin-polymyxin-gramicidin  07/09/2015   Other Other (See Comments) 07/09/2015    Medications: Outpatient Encounter Medications as of 03/26/2024  Medication  Sig   cetirizine (ZYRTEC) 10 MG tablet Take 10 mg by mouth daily.   cyanocobalamin  (VITAMIN B12) 1000 MCG tablet Take by mouth.   cyanocobalamin  (VITAMIN B12) 1000 MCG/ML injection Inject 1 mL (1,000 mcg total) into the muscle every 30 (thirty) days.   dicyclomine  (BENTYL ) 10 MG capsule Take by mouth.   fluticasone  (FLONASE ) 50 MCG/ACT nasal spray Place 2 sprays into both nostrils daily.    levothyroxine  (SYNTHROID ) 75 MCG tablet Take 1 tablet (75 mcg total) by mouth daily before breakfast.   lidocaine  (LIDODERM ) 5 % Place 1 patch onto the skin daily. Remove & Discard patch within 12 hours or as directed by MD   Multiple Vitamin (MULTI-VITAMINS) TABS Take 1 tablet by mouth daily.    rosuvastatin  (CRESTOR ) 10 MG tablet Take 1 tablet (10 mg total) by mouth daily.   Tuberculin-Allergy Syringes (BD ALLERGY SYRINGE) 27G X 3/8" 1 ML MISC every Wednesday Patient receives allergy shot "once a week".   No facility-administered encounter medications on file as of 03/26/2024.    Social History: Social History   Tobacco Use   Smoking status: Never   Smokeless tobacco: Never  Vaping Use   Vaping status: Never Used  Substance Use Topics   Alcohol use: No    Alcohol/week: 0.0 standard drinks of alcohol   Drug use: No    Family Medical History: Family History  Problem Relation Age of Onset   Clotting disorder Mother    Hyperlipidemia Father    Breast cancer Neg Hx     Physical Examination: @VITALWITHPAIN @  General: Patient is well developed, well nourished, calm, collected, and in no apparent distress. Attention to examination is appropriate.  Psychiatric: Patient is non-anxious.  Head:  Pupils equal, round, and reactive to light.  ENT:  Oral mucosa appears well hydrated.  Neck:   Supple.  Full range of motion.  Respiratory: Patient is breathing without any difficulty.  Extremities: No edema.  Vascular: Palpable dorsal pedal pulses.  Skin:   On exposed skin, there are no abnormal skin lesions.  NEUROLOGICAL:     Awake, alert, oriented to person, place, and time.  Speech is clear and fluent. Fund of knowledge is appropriate.   Cranial Nerves: Pupils equal round and reactive to light.  Facial tone is symmetric.    ROM of spine: Largely nontender to palpation of her cervical spine.  She does have some tenderness palpation of her lumbar paraspinals.  Negative  Spurling's.  Positive straight leg raise on the left.  Strength: Side Biceps Triceps Deltoid Interossei Grip Wrist Ext. Wrist Flex.  R 5 5 5 5 5 5 5   L 5 5 5 5 5 5 5    Side Iliopsoas Quads Hamstring PF DF EHL  R 5 5 5 5 5 5   L 5 5 5 5 5 5    Reflexes are 2+ and symmetric at the biceps, triceps, brachioradialis, patella and achilles.   Hoffman's is absent.  Clonus is not present.  Toes are down-going.  Bilateral upper and lower extremity sensation is intact to light touch, however significantly decrease sensation in bilateral feet. Gait is normal.   No difficulty with tandem gait.   No evidence of dysmetria noted.  Medical Decision Making  Imaging: Lumbar spine (02/07/24):  EXAM: LUMBAR SPINE - COMPLETE 4+ VIEW   COMPARISON:  CT abdomen and pelvis 06/14/2017   FINDINGS: Normal anatomic alignment. No evidence for acute fracture or dislocation. Preservation of the vertebral body heights. Mild L5-S1 degenerative disc disease.  Lower lumbar spine facet degenerative changes. Stool throughout the colon. Cholecystectomy clips. SI joints unremarkable.   IMPRESSION: Lower lumbar spine degenerative disc and facet disease.  Thoracic spine (02/07/24):   EXAM: THORACIC SPINE - 3 VIEWS   COMPARISON:  CT chest 08/02/2021   FINDINGS: Normal anatomic alignment. No acute fracture or dislocation. Mild midthoracic spine degenerative disc disease. Lungs are clear.   IMPRESSION: Mild midthoracic spine degenerative disc disease.  I have personally reviewed the images and agree with the above interpretation.  Assessment and Plan: [name] is a pleasant 63 y.o. female is here today with a chief complaint of neck and back pain.  This has been ongoing for 20 years however has become progressively worse over the past year.  Primarily the pain is in her back that radiates down the back of her left leg and goes into the bottom of her foot.  She states this pain is sharp in nature and is  accompanied with numbness and tingling in her foot.  She also has numbness and tingling in her right foot without radiating pain from her back down her leg.  She describes it as she has a thick pad on the bottom of her feet and that at times she feels as though she is walking with water in her shoes.  She states it has become more difficult to sleep.  She denies any weakness or dysfunction to bowel and bladder.  She states that she is unable to take NSAIDs due to recommendations from her gastrointestinal physician.  Previously she has undergone chiropractic care which was helpful.  Appears patient has primarily myofascial pain in her cervical spine.  She very likely could be experiencing a lumbar radiculopathy of her left lower extremity.  However she does have baseline neuropathy in her feet.  Per the patient she does not have diabetes, but does have some longstanding thyroid  issues.  Plan:  -Lidocaine  patches prescribed for her neck and back.  Advised patient that she can take Tylenol  as that is easier on her stomach compared to the ibuprofen. - Patient appears to be suffering from radiculopathy but also a potential baseline neuropathy that is becoming worse in her feet.  Referral to neurology made and requested EMG for bilateral lower extremities. - Physical therapy order placed - Updated lumbar x-ray orders also placed. - See back in 8 weeks to evaluate for progress.   Thank you for involving me in the care of this patient.   I spent a total of 45 minutes in both face-to-face and non-face-to-face activities for this visit on the date of this encounter preparing to see the patient, reviewing prior imaging, obtaining appropriate examination history, counseling the patient, ordering additional imaging and test, referral placement, independently interpreting results, and care coronation.  Ludwig Safer, PA-C Dept. of Neurosurgery

## 2024-03-25 ENCOUNTER — Encounter: Payer: Self-pay | Admitting: Family Medicine

## 2024-03-25 DIAGNOSIS — J3089 Other allergic rhinitis: Secondary | ICD-10-CM

## 2024-03-25 DIAGNOSIS — L2084 Intrinsic (allergic) eczema: Secondary | ICD-10-CM

## 2024-03-25 DIAGNOSIS — J321 Chronic frontal sinusitis: Secondary | ICD-10-CM

## 2024-03-26 ENCOUNTER — Ambulatory Visit (INDEPENDENT_AMBULATORY_CARE_PROVIDER_SITE_OTHER): Admitting: Physician Assistant

## 2024-03-26 ENCOUNTER — Telehealth: Payer: Self-pay

## 2024-03-26 ENCOUNTER — Encounter: Payer: Self-pay | Admitting: Physician Assistant

## 2024-03-26 VITALS — BP 120/84 | Ht 65.0 in | Wt 177.0 lb

## 2024-03-26 DIAGNOSIS — M5442 Lumbago with sciatica, left side: Secondary | ICD-10-CM | POA: Diagnosis not present

## 2024-03-26 DIAGNOSIS — M5416 Radiculopathy, lumbar region: Secondary | ICD-10-CM

## 2024-03-26 DIAGNOSIS — R202 Paresthesia of skin: Secondary | ICD-10-CM | POA: Diagnosis not present

## 2024-03-26 DIAGNOSIS — M542 Cervicalgia: Secondary | ICD-10-CM | POA: Diagnosis not present

## 2024-03-26 DIAGNOSIS — G8929 Other chronic pain: Secondary | ICD-10-CM

## 2024-03-26 DIAGNOSIS — R200 Anesthesia of skin: Secondary | ICD-10-CM | POA: Diagnosis not present

## 2024-03-26 MED ORDER — LIDOCAINE 5 % EX PTCH: 1.0000 | MEDICATED_PATCH | CUTANEOUS | 0 refills | Status: AC

## 2024-03-26 NOTE — Telephone Encounter (Signed)
 Prior authorization was submitted to cover my meds for Lidocaine  patches. Request was denied today by [facility] Medicaid.

## 2024-03-26 NOTE — Patient Instructions (Signed)
 LOCAL PHYSICAL THERAPY  [facility]  [address]  [place], [place] [address]  [phone]  [facility]  [address] [place], [place] [address]  [phone]  [facility] (2 locations)  [address]  [place], [place] [address]  [phone]          or  [address]  [place], [place] [address]  [phone]  [facility]  [address]  [place], [place] [address]  [phone]  **dry needling**  [facility])  [address]  [place], [place] [address]  [phone]  Fax: [phone]  ** Aquatic therapy[address] [place], [place] [address] [phone] **Aquatic therapy**  [facility]  [facility]  [address]  [place], [place] [address]  [phone]  [facility]  [address]  [place], [place] [address]  [phone]  [facility]  [address]   Janora Norlander  [place], [place] [address]  [phone]  [facility]  [address]  [place], [place] [address]  [phone]  **dry needling**   PELVIC FLOOR/SI JOINT  [facility]  Mariane Masters, PT  [email or web address]   [place]  [facility]  [address]  [address] [place], [place] [address]  [phone]   [facility] [place]  [address], [place] [address]  [phone]   [facility], [place]  [facility]  Raymond Gurney, PT  [address]  [place], [place] [address] [phone]   Samara Deist  [facility]  [address]  [phone]   [facility]  [address]  [phone]   [facility]  Annamaria Boots [facility]  [address]  [place], [place] [address]  [phone]   **No Aetna or medicaid**  [facility]  [address] [place] [address]  [phone]  [facility]  [address]  [place], [place] [address]  [phone]  Dannial Monarch, DC  [address] [place], [place] [address]  [phone]  [facility]  [address] [place] [address]  [phone]  [facility]  [address]  [place], [place] [address]  [phone]  [facility]  (3 locations)  [address]  [place], [place] [address]  [phone]  **dry needling**           or  [address] [place], [place] [address]  [phone]  **Additionally has Gloris Manchester, OT**           or  [address]  [place], [place] [address]  [phone]  **Pediatric therapy**  [facility]  [address]  [phone]  **dry needlingVerdie Drown [facility]  [address]  [place], [place] [address]  [phone]  [facility]   (Inside [address] [facility])  [address]  [place], [place] [address]  [phone]  **dry needling**  **MEDICAID or UNINSURED** [facility] dept. Of Physical Therapy [place], [place] [address] [phone]  [facility]  [address] [place], [place] [address]  [phone]   [facility]  [address]  [place], [place] [address]  [phone]   Doreatha Martin  [facility]  [address] [place], [place] [address]  [phone]   [facility]  [address]  [place], [place] [address]  [phone]   [facility]  [address] [place], [place] [address]  [phone]  [facility]  [address]  [place], [place] [address]  [phone]  AQUATIC  Kathalene Frames [facility]  [employer]  [facility]  [place]  [place]  *Residents only*   [facility] [employer]  *[facility]  Exercise class  [facility]  Exercise class  [facility]  [address]  [place], [place]   [phone]  [facility]  [address]  [place], [place] [address]  [phone]   [place], [place]  [facility]  [address]  [phone]   [facility]  [facility]  [address]  [phone]           or  [address]  [phone]   [facility] Arthritis Support Group   Provides education and support and practical information for coping with arthritis for arthritis sufferers and their families.   When: 12:15 - 1:30 p.m. the second Monday of each month, March through December  Info: Call Rehabilitation Services at [phone]

## 2024-03-26 NOTE — Telephone Encounter (Signed)
 Please let the patient know that the lidocaine  patches have been denied. She can either pay for these out of pocket or try the ones over the counter.   If she has nay questions please let me know.

## 2024-03-27 ENCOUNTER — Other Ambulatory Visit: Payer: Self-pay

## 2024-03-27 ENCOUNTER — Encounter: Payer: Self-pay | Admitting: Neurology

## 2024-03-27 DIAGNOSIS — R202 Paresthesia of skin: Secondary | ICD-10-CM

## 2024-03-28 DIAGNOSIS — K3189 Other diseases of stomach and duodenum: Secondary | ICD-10-CM | POA: Diagnosis not present

## 2024-03-28 DIAGNOSIS — C7A092 Malignant carcinoid tumor of the stomach: Secondary | ICD-10-CM | POA: Diagnosis not present

## 2024-03-28 DIAGNOSIS — K2950 Unspecified chronic gastritis without bleeding: Secondary | ICD-10-CM | POA: Diagnosis not present

## 2024-03-28 DIAGNOSIS — K31A0 Gastric intestinal metaplasia, unspecified: Secondary | ICD-10-CM | POA: Diagnosis not present

## 2024-03-28 DIAGNOSIS — Z85020 Personal history of malignant carcinoid tumor of stomach: Secondary | ICD-10-CM | POA: Diagnosis not present

## 2024-03-31 ENCOUNTER — Ambulatory Visit: Attending: Physician Assistant

## 2024-03-31 DIAGNOSIS — M542 Cervicalgia: Secondary | ICD-10-CM | POA: Insufficient documentation

## 2024-03-31 DIAGNOSIS — M5459 Other low back pain: Secondary | ICD-10-CM | POA: Insufficient documentation

## 2024-03-31 DIAGNOSIS — R202 Paresthesia of skin: Secondary | ICD-10-CM | POA: Insufficient documentation

## 2024-03-31 DIAGNOSIS — G8929 Other chronic pain: Secondary | ICD-10-CM | POA: Diagnosis not present

## 2024-03-31 DIAGNOSIS — R200 Anesthesia of skin: Secondary | ICD-10-CM | POA: Diagnosis not present

## 2024-03-31 DIAGNOSIS — M6281 Muscle weakness (generalized): Secondary | ICD-10-CM | POA: Diagnosis not present

## 2024-03-31 DIAGNOSIS — M546 Pain in thoracic spine: Secondary | ICD-10-CM | POA: Diagnosis present

## 2024-03-31 DIAGNOSIS — M5442 Lumbago with sciatica, left side: Secondary | ICD-10-CM | POA: Diagnosis not present

## 2024-03-31 NOTE — Therapy (Signed)
 OUTPATIENT PHYSICAL THERAPY THORACOLUMBAR EVALUATION   Patient Name: Dana Hensley MRN: [id number] DOB:1961/08/12, 63 y.o., female Today's Date: 03/31/2024  END OF SESSION:  PT End of Session - 03/31/24 0813     Visit Number 1    Number of Visits 25    Date for PT Re-Evaluation 06/23/24    PT Start Time 0815    PT Stop Time 0904    PT Time Calculation (min) 49 min    Activity Tolerance Patient tolerated treatment well    Behavior During Therapy WFL for tasks assessed/performed             Past Medical History:  Diagnosis Date   Allergy    Anxiety    Asthma    B12 deficiency    Barrett esophagus    Chronic atrophic gastritis    DVT, lower extremity (HCC)    Gastric polyps    HLD (hyperlipidemia)    Hypothyroidism    IGT (impaired glucose tolerance)    Neuroendocrine tumor    carcinoid tumors of the stomach, s/p extraction surgery at [facility]   PONV (postoperative nausea and vomiting)    Vitamin D  deficiency    Past Surgical History:  Procedure Laterality Date   APPENDECTOMY     CHOLECYSTECTOMY N/A 11/16/2016   Procedure: LAPAROSCOPIC CHOLECYSTECTOMY WITH INTRAOPERATIVE CHOLANGIOGRAM;  Surgeon: Benancio Bracket, MD;  Location: [facility] ORS;  Service: General;  Laterality: N/A;   COLONOSCOPY WITH PROPOFOL  N/A 04/21/2021   Procedure: COLONOSCOPY WITH PROPOFOL ;  Surgeon: Marnee Sink, MD;  Location: [facility] ENDOSCOPY;  Service: Endoscopy;  Laterality: N/A;   DIAGNOSTIC LAPAROSCOPY     ESOPHAGOGASTRODUODENOSCOPY     TONSILLECTOMY     TOTAL ABDOMINAL HYSTERECTOMY W/ BILATERAL SALPINGOOPHORECTOMY  2012   due to ovar cysts/pain   VEIN LIGATION AND STRIPPING     Patient Active Problem List   Diagnosis Date Noted   Leg pain 02/04/2024   Other chest pain 08/02/2021   Chest pain 08/02/2021   Hx of colonic polyps    Hyperlipidemia, unspecified 08/29/2018   IGT (impaired glucose tolerance) 08/29/2018   Intrinsic atopic dermatitis 08/29/2018   History of adenomatous polyp of  colon 04/01/2018   Elevated liver function tests 01/18/2018   Weight loss 01/15/2017   Pain in limb 12/18/2016   Calculus of gallbladder without cholecystitis without obstruction 10/23/2016   Chronic venous insufficiency 10/16/2016   Varicose veins of both lower extremities with pain 10/16/2016   Lymphedema 10/16/2016   Epigastric pain 10/03/2016   Barrett's esophagus determined by endoscopy 01/25/2016   Vitamin B12 deficiency 10/18/2015   Chronic neck and back pain 08/19/2015   Allergic rhinitis with postnasal drip 07/09/2015   Dyslipidemia 05/05/2015   Acquired hypothyroidism 05/05/2015   Carcinoid tumor 05/05/2015   History of malignant carcinoid tumor of stomach 03/26/2012    PCP: Raina Bunting, DO  REFERRING PROVIDER: Ludwig Safer, PA-C  REFERRING DIAG: R20.0,R20.2 (ICD-10-CM) - Numbness and tingling of both legs M54.2 (ICD-10-CM) - Neck pain G89.29,M54.42 (ICD-10-CM) - Chronic bilateral low back pain with left-sided sciatica  RATIONALE FOR EVALUATION AND TREATMENT: Rehabilitation  THERAPY DIAG: Other low back pain  Pain in thoracic spine  Muscle weakness (generalized)  ONSET DATE: Chronic (~20 years)   FOLLOW-UP APPT SCHEDULED WITH REFERRING PROVIDER: No    SUBJECTIVE:  SUBJECTIVE STATEMENT:  Chief Concern of Neck Pain and low back pain   PERTINENT HISTORY: Patient reports to OPPT with a chief complaint of neck and back pain.  Patient has been experiencing chronic LBP for ~20 yrs but symptoms have worsened in the last year. She reports termination from her job in 2023 and within the last year she has been helping her son with care taking of her 2 grandchildren. Her son lives in a multistory home and she constantly has to perform stair navigation (15 stairs).  She reports  increase in activities within the last year with care taking for her family and believes that the increase of activities has aggravated her back. Patient describes that currently her pain is located along her mid thoracic spine and radiates to her lumbar spine. Additionally, Pt reports that pain in her lumbar spine can radiates down the L posterior thigh and into the bottom of her foot. She describes the pain as sharp along the L leg and reports numbness and tingling in both feet. However she does have baseline neuropathy in her feet. She has occasional muscle spasms in both calves with increased activity throughout the day. She reports that she cannot take NSAIDS due to gastric conditions.  She denies changes to b/b, saddle anasthesia, abdominal pain, chills/fever, night sweats, nausea, vomiting,   Dominant hand: right  Imaging: Yes  CLINICAL DATA:  Chronic low back pain EXAM: LUMBAR SPINE - COMPLETE 4+ VIEW COMPARISON:  CT abdomen and pelvis 06/14/2017= FINDINGS: Normal anatomic alignment. No evidence for acute fracture or dislocation. Preservation of the vertebral body heights. Mild L5-S1 degenerative disc disease. Lower lumbar spine facet degenerative changes. Stool throughout the colon. Cholecystectomy clips. SI joints unremarkable. IMPRESSION: Lower lumbar spine degenerative disc and facet disease.  Electronically Signed   By: Jone Neither M.D.   On: 02/07/2024 23:18   PAIN:    Pain Intensity: Present: 6/10, Best: 3-4/10, Worst: 7-8/10 Pain location: Thoracic to Lumbar  Pain Quality: constant  Radiating: Yes  Focal Weakness: Yes Aggravating factors: Lifting, Bending, Kneeling Position Relieving factors: Walking and rest, lidocaine  patches 24-hour pain behavior: Activity Dependent, worse in the evening How long can you sit: 15-20 How long can you stand: History of prior back injury, pain, surgery, or therapy: Yes  PRECAUTIONS: Fall  WEIGHT BEARING RESTRICTIONS: No  FALLS:  Has patient fallen in last 6 months? No  Living Environment Lives with: lives with their family Lives in: House/apartment Stairs: No Has following equipment at home: None  Prior level of function: Independent  Occupational demands: Unemployed   Hobbies: Garden, Taking Care of [place] children, walking the dog   Patient Goals: "To have no pain in my feet and back"    OBJECTIVE:  Patient Surveys  Modified Oswestry 18 / 50 = 36.0 %    Cognition Patient is oriented to person, place, and time.  Recent memory is intact.  Remote memory is intact.  Attention span and concentration are intact.  Expressive speech is intact.  Patient's fund of knowledge is within normal limits for educational level.    Gross Musculoskeletal Assessment Tremor: None Bulk: Normal Tone: Normal  GAIT: Distance walked: 10 Assistive device utilized: None Level of assistance: Complete Independence Comments: Reciprocal WNL  Posture (Seated): Increased Thoracic Kyphosis Lumbar lordosis: WNL  AROM AROM (Normal range in degrees) AROM   Lumbar   Flexion (65) 100%  Extension (30) 100%  Right lateral flexion (25) 80% *   Left lateral flexion (25) 80%*  Right rotation (30) 100%  Left rotation (30) 100%      Hip Right Left  Flexion (125) WNL  WNL  Extension (15)    Abduction (40)    Adduction     Internal Rotation (45) 32 36  External Rotation (45)        Knee    Flexion (135)    Extension (0)        Ankle    Dorsiflexion (20)    Plantarflexion (50)    Inversion (35)    Eversion (15)    (* = pain; Blank rows = not tested)  LE MMT: MMT (out of 5) Right  Left   Hip flexion 4- 4-  Hip extension    Hip abduction    Hip adduction    Hip internal rotation    Hip external rotation    Knee flexion 4 4  Knee extension 4 4  Ankle dorsiflexion 5 5  Ankle plantarflexion 5 5  Ankle inversion    Ankle eversion    (* = pain; Blank rows = not tested)  Sensation Grossly intact to light  touch throughout bilateral LEs as determined by testing dermatomes L2-S2. Proprioception, stereognosis, and hot/cold testing deferred on this date.  Reflexes Deferred   Muscle Length Hamstrings: R: Deferred L: Deferred  Palpation Location Right Left         Lumbar paraspinals 0 0  Quadratus Lumborum    Gluteus Maximus 0 0  Gluteus Medius 1 1  Deep hip external rotators 2 2  PSIS    Fortin's Area (SIJ)    Greater Trochanter    Thoracic Paraspinals (T7-T12)  1 1  Rhomboids  1  1  (Blank rows = not tested) Graded on 0-4 scale (0 = no pain, 1 = pain, 2 = pain with wincing/grimacing/flinching, 3 = pain with withdrawal, 4 = unwilling to allow palpation)  Passive Accessory Intervertebral Motion Pt with reproduction of back pain with CPA L1-L5 and bilaterally T6-T12. Generally, hypomobile throughout  Special Tests Lumbar Radiculopathy and Discogenic: Slump (SN 83, -LR 0.32): R: Deferred   L: Deferred  SLR (SN 92, -LR 0.29): R: Deferred L:  Deferred  Crossed SLR (SP 90): R: Deferred   L: Deferred   Facet Joint: Extension-Rotation (SN 100, -LR 0.0): R: Negative L: Negative  Hip: FABER (SN 81): R: Negative L: Negative FADIR (SN 94): R: Negative L: Negative Hip scour (SN 50): R:  L:    Functional Tasks Deep squat: Narrow BoS, limited Ankle DF  Forward Step-Down Test: R: Hip IR/ADD L:  Lateral Step-Down Test: R: Hip IR/ADD L:   30s Sit to Stand: 16 reps  TODAY'S TREATMENT: DATE:   Eval Only     PATIENT EDUCATION:  Education details: HEP, POC, Prognosis  Person educated: Patient Education method: Explanation, Demonstration, and Handouts Education comprehension: verbalized understanding and returned demonstration   HOME EXERCISE PROGRAM:   Access Code: [email or web address] URL: [email or web address] Date: 03/31/2024 Prepared by: Satira Curet  Exercises - Supine Bridge  - 1 x daily - 7 x weekly - 3 sets - 10 reps - Supine Lower Trunk Rotation  - 1 x daily -  7 x weekly - 3 sets - 10 reps - Supine Figure 4 Piriformis Stretch  - 1 x daily - 7 x weekly - 3 sets - 10 reps - Sidelying Open Book Thoracic Lumbar Rotation and Extension  - 1 x daily - 7 x weekly - 3 sets - 10 reps - Seated  Hamstring Stretch  - 1 x daily - 7 x weekly - 3 sets - 10 reps  ASSESSMENT:  CLINICAL IMPRESSION: Patient is a 63 y.o. female who was seen today for physical therapy evaluation and treatment for chronic low back pain and neck pain. Objective findings suggest deficits in LE ROM, LE strength and mobility along thoracolumbar spine. Seated posture with notable kyphosis in thoracic spine and forward posture. CPA along thoracic spine and lumbar spine with segmental concordant pain, no radiating pain reported from patient. Patient TTP along deep gluteals, piriformis and paraspinals along thoracic and lumbar regions. PT suspects radicular symptoms from lumbar spine along lower extremity; plan to assess radiculopathy special testing in following session. PT provided initial HEP in order to improve mobility of thoracic spine and tension along gluteal muscles. Patient will benefit from skilled PT in order to address current deficits and maximize improvements in QoL.  OBJECTIVE IMPAIRMENTS: decreased activity tolerance, decreased strength, hypomobility, impaired flexibility, and pain.   ACTIVITY LIMITATIONS: carrying, lifting, bending, squatting, sleeping, transfers, and caring for others  PARTICIPATION LIMITATIONS: yard work  PERSONAL FACTORS: Age, Behavior pattern, Past/current experiences, and Time since onset of injury/illness/exacerbation are also affecting patient's functional outcome.   REHAB POTENTIAL: Good  CLINICAL DECISION MAKING: Evolving/moderate complexity  EVALUATION COMPLEXITY: Moderate   GOALS: Goals reviewed with patient? No  SHORT TERM GOALS: Target date: 05/12/2024  Pt will be independent with HEP in order to improve strength and decrease back pain to  improve pain-free function at home and work. Baseline: 03/31/2024: Initial Provided  Goal status: INITIAL   LONG TERM GOALS: Target date: 06/23/2024  Pt will demonstrate ability to navigate 15 steps on stairs pain free to demonstrate significant improvement in functional movements and pain.  Baseline:  Goal status: INITIAL  2.  Pt will decrease worst back pain by at least 2 points on the NPRS in order to demonstrate clinically significant reduction in back pain. Baseline: 03/31/2024: 8/10 NPRS Goal status: INITIAL  3.  Pt will decrease mODI score by at least 13 points in order demonstrate clinically significant reduction in back pain/disability.       Baseline: 03/31/2024:  18 / 50 = 36.0 % (50/50 Max Disability)  Goal status: INITIAL  4.  Pt will increase in R/L internal ROM in order to demonstrate increased tissue extensibility and decrease in pain from ext Baseline: 03/31/2024 Internal Rotation (45) 32 36   Goal status: INITIAL  5.  Pt will increase 30s STS by 5 reps in order to demonstrate improvements in LE strength and endurance Baseline: 03/31/2024: 16 reps Goal status: INITIAL   PLAN: PT FREQUENCY: 1-2x/week  PT DURATION: 12 weeks  PLANNED INTERVENTIONS: Therapeutic exercises, Therapeutic activity, Neuromuscular re-education, Balance training, Gait training, Patient/Family education, Self Care, Joint mobilization, Joint manipulation, Vestibular training, Canalith repositioning, Orthotic/Fit training, DME instructions, Dry Needling, Electrical stimulation, Spinal manipulation, Spinal mobilization, Cryotherapy, Moist heat, Taping, Traction, Ultrasound, Ionotophoresis 4mg /ml Dexamethasone , Manual therapy, and Re-evaluation.  PLAN FOR NEXT SESSION: Hip MMT (Abd, IR/ER), Hamstring Test, Slump or SLR; initiate gluteal and core strengthening, initiate thoracolumbar mobility, functional strength, hip hinge training    Satira Curet PT, DPT Physical Therapist- [facility]   [facility]  03/31/2024, 10:58 PM

## 2024-04-03 ENCOUNTER — Ambulatory Visit
Admission: RE | Admit: 2024-04-03 | Discharge: 2024-04-03 | Disposition: A | Discharge status: Home / Self Care | Source: Ambulatory Visit | Attending: Physician Assistant | Admitting: Physician Assistant

## 2024-04-03 ENCOUNTER — Ambulatory Visit
Admission: RE | Admit: 2024-04-03 | Discharge: 2024-04-03 | Disposition: A | Discharge status: Home / Self Care | Attending: Physician Assistant | Admitting: Physician Assistant

## 2024-04-03 DIAGNOSIS — M47817 Spondylosis without myelopathy or radiculopathy, lumbosacral region: Secondary | ICD-10-CM | POA: Diagnosis not present

## 2024-04-03 DIAGNOSIS — G8929 Other chronic pain: Secondary | ICD-10-CM

## 2024-04-03 DIAGNOSIS — R202 Paresthesia of skin: Secondary | ICD-10-CM | POA: Diagnosis not present

## 2024-04-03 DIAGNOSIS — R200 Anesthesia of skin: Secondary | ICD-10-CM | POA: Insufficient documentation

## 2024-04-03 DIAGNOSIS — M5442 Lumbago with sciatica, left side: Secondary | ICD-10-CM | POA: Diagnosis not present

## 2024-04-03 DIAGNOSIS — M549 Dorsalgia, unspecified: Secondary | ICD-10-CM | POA: Diagnosis not present

## 2024-04-07 DIAGNOSIS — Z419 Encounter for procedure for purposes other than remedying health state, unspecified: Secondary | ICD-10-CM | POA: Diagnosis not present

## 2024-04-08 ENCOUNTER — Ambulatory Visit

## 2024-04-08 DIAGNOSIS — M546 Pain in thoracic spine: Secondary | ICD-10-CM

## 2024-04-08 DIAGNOSIS — M6281 Muscle weakness (generalized): Secondary | ICD-10-CM

## 2024-04-08 DIAGNOSIS — M5442 Lumbago with sciatica, left side: Secondary | ICD-10-CM | POA: Diagnosis not present

## 2024-04-08 DIAGNOSIS — M5459 Other low back pain: Secondary | ICD-10-CM | POA: Diagnosis not present

## 2024-04-08 NOTE — Therapy (Signed)
 OUTPATIENT PHYSICAL THERAPY THORACOLUMBAR TREATMENT   Patient Name: Dana Hensley MRN: [id number] DOB:1961/09/16, 63 y.o., female Today's Date: 04/08/2024  END OF SESSION:  PT End of Session - 04/08/24 0814     Visit Number 2    Number of Visits 25    Date for PT Re-Evaluation 06/23/24    Authorization Type [employer] auth#[id number]    Authorization Time Period 5/13-7/12    Authorization - Visit Number 1    Authorization - Number of Visits 10    Progress Note Due on Visit 10    PT Start Time 0815    PT Stop Time 0901    PT Time Calculation (min) 46 min    Activity Tolerance Patient tolerated treatment well    Behavior During Therapy WFL for tasks assessed/performed              Past Medical History:  Diagnosis Date   Allergy    Anxiety    Asthma    B12 deficiency    Barrett esophagus    Chronic atrophic gastritis    DVT, lower extremity (HCC)    Gastric polyps    HLD (hyperlipidemia)    Hypothyroidism    IGT (impaired glucose tolerance)    Neuroendocrine tumor    carcinoid tumors of the stomach, s/p extraction surgery at [facility]   PONV (postoperative nausea and vomiting)    Vitamin D  deficiency    Past Surgical History:  Procedure Laterality Date   APPENDECTOMY     CHOLECYSTECTOMY N/A 11/16/2016   Procedure: LAPAROSCOPIC CHOLECYSTECTOMY WITH INTRAOPERATIVE CHOLANGIOGRAM;  Surgeon: Benancio Bracket, MD;  Location: [facility] ORS;  Service: General;  Laterality: N/A;   COLONOSCOPY WITH PROPOFOL  N/A 04/21/2021   Procedure: COLONOSCOPY WITH PROPOFOL ;  Surgeon: Marnee Sink, MD;  Location: [facility] ENDOSCOPY;  Service: Endoscopy;  Laterality: N/A;   DIAGNOSTIC LAPAROSCOPY     ESOPHAGOGASTRODUODENOSCOPY     TONSILLECTOMY     TOTAL ABDOMINAL HYSTERECTOMY W/ BILATERAL SALPINGOOPHORECTOMY  2012   due to ovar cysts/pain   VEIN LIGATION AND STRIPPING     Patient Active Problem List   Diagnosis Date Noted   Leg pain 02/04/2024   Other chest pain 08/02/2021   Chest pain  08/02/2021   Hx of colonic polyps    Hyperlipidemia, unspecified 08/29/2018   IGT (impaired glucose tolerance) 08/29/2018   Intrinsic atopic dermatitis 08/29/2018   History of adenomatous polyp of colon 04/01/2018   Elevated liver function tests 01/18/2018   Weight loss 01/15/2017   Pain in limb 12/18/2016   Calculus of gallbladder without cholecystitis without obstruction 10/23/2016   Chronic venous insufficiency 10/16/2016   Varicose veins of both lower extremities with pain 10/16/2016   Lymphedema 10/16/2016   Epigastric pain 10/03/2016   Barrett's esophagus determined by endoscopy 01/25/2016   Vitamin B12 deficiency 10/18/2015   Chronic neck and back pain 08/19/2015   Allergic rhinitis with postnasal drip 07/09/2015   Dyslipidemia 05/05/2015   Acquired hypothyroidism 05/05/2015   Carcinoid tumor 05/05/2015   History of malignant carcinoid tumor of stomach 03/26/2012    PCP: Raina Bunting, DO  REFERRING PROVIDER: Ludwig Safer, PA-C  REFERRING DIAG: R20.0,R20.2 (ICD-10-CM) - Numbness and tingling of both legs M54.2 (ICD-10-CM) - Neck pain G89.29,M54.42 (ICD-10-CM) - Chronic bilateral low back pain with left-sided sciatica  RATIONALE FOR EVALUATION AND TREATMENT: Rehabilitation  THERAPY DIAG: Other low back pain  Pain in thoracic spine  Muscle weakness (generalized)  ONSET DATE: Chronic (~20 years)   FOLLOW-UP APPT SCHEDULED  WITH REFERRING PROVIDER: No    SUBJECTIVE:                                                                                                                                                                                         SUBJECTIVE STATEMENT:  Chief Concern of Neck Pain and low back pain   PERTINENT HISTORY: Patient reports to OPPT with a chief complaint of neck and back pain.  Patient has been experiencing chronic LBP for ~20 yrs but symptoms have worsened in the last year. She reports termination from her job in 2023 and  within the last year she has been helping her son with care taking of her 2 grandchildren. Her son lives in a multistory home and she constantly has to perform stair navigation (15 stairs).  She reports increase in activities within the last year with care taking for her family and believes that the increase of activities has aggravated her back. Patient describes that currently her pain is located along her mid thoracic spine and radiates to her lumbar spine. Additionally, Pt reports that pain in her lumbar spine can radiates down the L posterior thigh and into the bottom of her foot. She describes the pain as sharp along the L leg and reports numbness and tingling in both feet. However she does have baseline neuropathy in her feet. She has occasional muscle spasms in both calves with increased activity throughout the day. She reports that she cannot take NSAIDS due to gastric conditions.  She denies changes to b/b, saddle anasthesia, abdominal pain, chills/fever, night sweats, nausea, vomiting,   Dominant hand: right  Imaging: Yes  CLINICAL DATA:  Chronic low back pain EXAM: LUMBAR SPINE - COMPLETE 4+ VIEW COMPARISON:  CT abdomen and pelvis 06/14/2017= FINDINGS: Normal anatomic alignment. No evidence for acute fracture or dislocation. Preservation of the vertebral body heights. Mild L5-S1 degenerative disc disease. Lower lumbar spine facet degenerative changes. Stool throughout the colon. Cholecystectomy clips. SI joints unremarkable. IMPRESSION: Lower lumbar spine degenerative disc and facet disease.  Electronically Signed   By: Jone Neither M.D.   On: 02/07/2024 23:18   PAIN:    Pain Intensity: Present: 6/10, Best: 3-4/10, Worst: 7-8/10 Pain location: Thoracic to Lumbar  Pain Quality: constant  Radiating: Yes  Focal Weakness: Yes Aggravating factors: Lifting, Bending, Kneeling Position Relieving factors: Walking and rest, lidocaine  patches 24-hour pain behavior: Activity Dependent,  worse in the evening How long can you sit: 15-20 How long can you stand: History of prior back injury, pain, surgery, or therapy: Yes  PRECAUTIONS: Fall  WEIGHT BEARING RESTRICTIONS: No  FALLS: Has patient fallen in last 6  months? No  Living Environment Lives with: lives with their family Lives in: House/apartment Stairs: No Has following equipment at home: None  Prior level of function: Independent  Occupational demands: Unemployed   Hobbies: Garden, Taking Care of [place] children, walking the dog   Patient Goals: "To have no pain in my feet and back"    OBJECTIVE:  Patient Surveys  Modified Oswestry 18 / 50 = 36.0 %    Cognition Patient is oriented to person, place, and time.  Recent memory is intact.  Remote memory is intact.  Attention span and concentration are intact.  Expressive speech is intact.  Patient's fund of knowledge is within normal limits for educational level.    Gross Musculoskeletal Assessment Tremor: None Bulk: Normal Tone: Normal  GAIT: Distance walked: 10 Assistive device utilized: None Level of assistance: Complete Independence Comments: Reciprocal WNL  Posture (Seated): Increased Thoracic Kyphosis Lumbar lordosis: WNL  AROM AROM (Normal range in degrees) AROM   Lumbar   Flexion (65) 100%  Extension (30) 100%  Right lateral flexion (25) 80% *   Left lateral flexion (25) 80%*   Right rotation (30) 100%  Left rotation (30) 100%      Hip Right Left  Flexion (125) WNL  WNL  Extension (15)    Abduction (40)    Adduction     Internal Rotation (45) 32 36  External Rotation (45)        Knee    Flexion (135)    Extension (0)        Ankle    Dorsiflexion (20)    Plantarflexion (50)    Inversion (35)    Eversion (15)    (* = pain; Blank rows = not tested)  LE MMT: MMT (out of 5) Right  Left   Hip flexion 4- 4-  Hip extension    Hip abduction 4- 4-  Hip adduction    Hip internal rotation    Hip external rotation     Knee flexion 4 4  Knee extension 4 4  Ankle dorsiflexion 5 5  Ankle plantarflexion 5 5  Ankle inversion    Ankle eversion    (* = pain; Blank rows = not tested)  Sensation Grossly intact to light touch throughout bilateral LEs as determined by testing dermatomes L2-S2. Proprioception, stereognosis, and hot/cold testing deferred on this date.  Reflexes Deferred   Muscle Length Hamstrings: R: Negative L: Negative  Palpation Location Right Left         Lumbar paraspinals 0 0  Quadratus Lumborum    Gluteus Maximus 0 0  Gluteus Medius 1 1  Deep hip external rotators 2 2  PSIS    Fortin's Area (SIJ)    Greater Trochanter    Thoracic Paraspinals (T7-T12)  1 1  Rhomboids  1  1  (Blank rows = not tested) Graded on 0-4 scale (0 = no pain, 1 = pain, 2 = pain with wincing/grimacing/flinching, 3 = pain with withdrawal, 4 = unwilling to allow palpation)  Passive Accessory Intervertebral Motion Pt with reproduction of back pain with CPA L1-L5 and bilaterally T6-T12. Generally, hypomobile throughout  Special Tests Lumbar Radiculopathy and Discogenic: Slump (SN 83, -LR 0.32): R: Deferred   L: Negative   SLR (SN 92, -LR 0.29): R: Deferred L:  Deferred  Crossed SLR (SP 90): R: Deferred   L: Deferred   Facet Joint: Extension-Rotation (SN 100, -LR 0.0): R: Negative L: Negative  Hip: FABER (SN 81): R: Negative L:  Negative FADIR (SN 94): R: Negative L: Negative Hip scour (SN 50): R:  L:    Functional Tasks Deep squat: Narrow BoS, limited Ankle DF  Forward Step-Down Test: R: Hip IR/ADD L:  Lateral Step-Down Test: R: Hip IR/ADD L:   30s Sit to Stand: 16 reps  TODAY'S TREATMENT: DATE: 04/08/2024  Subjective: Patient reports some pain in middle/low back (4/10 NPS) currently. She also describes pain in bilateral feet. No questions or concerns.   Therapeutic Exercise:   Supine Bridges    2 x 12, minor pain in lumbar     Neutral Curl Up (RLE Straight)    3 x 15s      Dead  bug    Static hold 1 x 15s       Alternating LE: 2 x 10    Side Plank (R Sidelying)    1 x 10s, Knees Bent    2 x 10s, Legs Straight (BUE support)     Pallof Press (Seated)    1 x 12 Blue TB    1 x 8 Green TB (Pain along shoulders)   Quadraped Thread the needle - reach under  1 x 10   Childs Pose  1 x 8, VC for hand positioning  Time spent at end of session updating HEP and educating patient on proper form/technique with use of Theraband. Good return demonstration of squat with resistance.   Therapeutic Activity: Partial Squats with resistance  2 x 10, blue TB around knees   Kettlebell Squat and Lift for optimizing bending and lifiting a  1 x 10 20#  1 x 8 20#   Seated Lumbar Extensions for improved seated/standing posture   1  x 10   Seated Thoracic Extensions for improved seated/standing posture  2 x 10, Added pillow behind T spine   PATIENT EDUCATION:  Education details: HEP, POC, Prognosis  Person educated: Patient Education method: Explanation, Demonstration, and Handouts Education comprehension: verbalized understanding and returned demonstration   HOME EXERCISE PROGRAM:   Access Code: [email or web address] URL: [email or web address] Date: 04/08/2024 Prepared by: Veryl Gottron Yates Weisgerber  Exercises - Supine Figure 4 Piriformis Stretch  - 2 x daily - 7 x weekly - 3 sets - 30 hold - Child's Pose Stretch  - 2 x daily - 7 x weekly - 3 sets - 30 hold - Seated Hamstring Stretch  - 2 x daily - 7 x weekly - 3 sets - 30-60s hold - Supine Lower Trunk Rotation  - 1 x daily - 7 x weekly - 2-3 sets - 10-2 reps - Sidelying Open Book Thoracic Lumbar Rotation and Extension  - 1 x daily - 7 x weekly - 2-3 sets - 10-12 reps - Quadruped Thoracic Rotation - Reach Under  - 1 x daily - 7 x weekly - 3 sets - 10-12 reps - Supine Bridge  - 1 x daily - 7 x weekly - 3 sets - 10 reps - Squat with Resistance at Thighs  - 1 x daily - 3-4 x weekly - 2-3 sets - 10-12 reps  Access Code:  [email or web address] URL: [email or web address] Date: 03/31/2024 Prepared by: Satira Curet  Exercises - Supine Bridge  - 1 x daily - 7 x weekly - 3 sets - 10 reps - Supine Lower Trunk Rotation  - 1 x daily - 7 x weekly - 3 sets - 10 reps - Supine Figure 4 Piriformis Stretch  - 1 x daily - 7 x weekly - 3 sets - 10 reps - Sidelying Open  Book Thoracic Lumbar Rotation and Extension  - 1 x daily - 7 x weekly - 3 sets - 10 reps - Seated Hamstring Stretch  - 1 x daily - 7 x weekly - 3 sets - 10 reps  ASSESSMENT:  CLINICAL IMPRESSION: Pt returns to OPPT for first follow up in management of thoracolumbar pain. Initiated core strengthening and functional strength with good participation from patient. Pt tolerated all interventions without report of additional pain in lumbar spine. Pt still presents with deficits in strength, activity tolerance and pain along thoracolumbar region limiting her full participation in recreational activities. Based on today's performance pt will continue to benefit from skilled physical therapy in order to maximize return to PLOF and improve QoL.    OBJECTIVE IMPAIRMENTS: decreased activity tolerance, decreased strength, hypomobility, impaired flexibility, and pain.   ACTIVITY LIMITATIONS: carrying, lifting, bending, squatting, sleeping, transfers, and caring for others  PARTICIPATION LIMITATIONS: yard work  PERSONAL FACTORS: Age, Behavior pattern, Past/current experiences, and Time since onset of injury/illness/exacerbation are also affecting patient's functional outcome.   REHAB POTENTIAL: Good  CLINICAL DECISION MAKING: Evolving/moderate complexity  EVALUATION COMPLEXITY: Moderate   GOALS: Goals reviewed with patient? No  SHORT TERM GOALS: Target date: 05/20/2024  Pt will be independent with HEP in order to improve strength and decrease back pain to improve pain-free function at home and work. Baseline: 03/31/2024: Initial Provided  Goal status:  INITIAL   LONG TERM GOALS: Target date: 07/01/2024  Pt will demonstrate ability to navigate 15 steps on stairs pain free to demonstrate significant improvement in functional movements and pain.  Baseline:  Goal status: INITIAL  2.  Pt will decrease worst back pain by at least 2 points on the NPRS in order to demonstrate clinically significant reduction in back pain. Baseline: 03/31/2024: 8/10 NPRS Goal status: INITIAL  3.  Pt will decrease mODI score by at least 13 points in order demonstrate clinically significant reduction in back pain/disability.       Baseline: 03/31/2024:  18 / 50 = 36.0 % (50/50 Max Disability)  Goal status: INITIAL  4.  Pt will increase in R/L internal ROM in order to demonstrate increased tissue extensibility and decrease in pain from ext Baseline: 03/31/2024 Internal Rotation (45) 32 36   Goal status: INITIAL  5.  Pt will increase 30s STS by 5 reps in order to demonstrate improvements in LE strength and endurance Baseline: 03/31/2024: 16 reps Goal status: INITIAL   PLAN: PT FREQUENCY: 1-2x/week  PT DURATION: 12 weeks  PLANNED INTERVENTIONS: Therapeutic exercises, Therapeutic activity, Neuromuscular re-education, Balance training, Gait training, Patient/Family education, Self Care, Joint mobilization, Joint manipulation, Vestibular training, Canalith repositioning, Orthotic/Fit training, DME instructions, Dry Needling, Electrical stimulation, Spinal manipulation, Spinal mobilization, Cryotherapy, Moist heat, Taping, Traction, Ultrasound, Ionotophoresis 4mg /ml Dexamethasone , Manual therapy, and Re-evaluation.  PLAN FOR NEXT SESSION:  initiate gluteal and core strengthening, initiate thoracolumbar mobility, functional strength, hip hinge training    Satira Curet PT, DPT Physical Therapist- [facility]  [facility]  04/08/2024, 10:17 AM

## 2024-04-10 ENCOUNTER — Ambulatory Visit

## 2024-04-15 ENCOUNTER — Ambulatory Visit: Payer: Self-pay

## 2024-04-15 ENCOUNTER — Encounter (INDEPENDENT_AMBULATORY_CARE_PROVIDER_SITE_OTHER): Payer: Self-pay

## 2024-04-15 DIAGNOSIS — M5442 Lumbago with sciatica, left side: Secondary | ICD-10-CM | POA: Diagnosis not present

## 2024-04-15 DIAGNOSIS — M6281 Muscle weakness (generalized): Secondary | ICD-10-CM

## 2024-04-15 DIAGNOSIS — M5459 Other low back pain: Secondary | ICD-10-CM | POA: Diagnosis not present

## 2024-04-15 DIAGNOSIS — M546 Pain in thoracic spine: Secondary | ICD-10-CM

## 2024-04-15 NOTE — Therapy (Signed)
 OUTPATIENT PHYSICAL THERAPY THORACOLUMBAR TREATMENT   Patient Name: Dana Hensley MRN: [id number] DOB:Aug 25, 1961, 63 y.o., female Today's Date: 04/15/2024  END OF SESSION:  PT End of Session - 04/15/24 1032     Visit Number 3    Number of Visits 25    Date for PT Re-Evaluation 06/23/24    Authorization Type [employer] auth#[id number]    Authorization Time Period 5/13-7/12    Authorization - Visit Number 3    Authorization - Number of Visits 10    Progress Note Due on Visit 10    PT Start Time 1031    PT Stop Time 1115    PT Time Calculation (min) 44 min    Activity Tolerance Patient tolerated treatment well    Behavior During Therapy WFL for tasks assessed/performed              Past Medical History:  Diagnosis Date   Allergy    Anxiety    Asthma    B12 deficiency    Barrett esophagus    Chronic atrophic gastritis    DVT, lower extremity (HCC)    Gastric polyps    HLD (hyperlipidemia)    Hypothyroidism    IGT (impaired glucose tolerance)    Neuroendocrine tumor    carcinoid tumors of the stomach, s/p extraction surgery at [facility]   PONV (postoperative nausea and vomiting)    Vitamin D  deficiency    Past Surgical History:  Procedure Laterality Date   APPENDECTOMY     CHOLECYSTECTOMY N/A 11/16/2016   Procedure: LAPAROSCOPIC CHOLECYSTECTOMY WITH INTRAOPERATIVE CHOLANGIOGRAM;  Surgeon: Benancio Bracket, MD;  Location: [facility] ORS;  Service: General;  Laterality: N/A;   COLONOSCOPY WITH PROPOFOL  N/A 04/21/2021   Procedure: COLONOSCOPY WITH PROPOFOL ;  Surgeon: Marnee Sink, MD;  Location: [facility] ENDOSCOPY;  Service: Endoscopy;  Laterality: N/A;   DIAGNOSTIC LAPAROSCOPY     ESOPHAGOGASTRODUODENOSCOPY     TONSILLECTOMY     TOTAL ABDOMINAL HYSTERECTOMY W/ BILATERAL SALPINGOOPHORECTOMY  2012   due to ovar cysts/pain   VEIN LIGATION AND STRIPPING     Patient Active Problem List   Diagnosis Date Noted   Leg pain 02/04/2024   Other chest pain 08/02/2021   Chest pain  08/02/2021   Hx of colonic polyps    Hyperlipidemia, unspecified 08/29/2018   IGT (impaired glucose tolerance) 08/29/2018   Intrinsic atopic dermatitis 08/29/2018   History of adenomatous polyp of colon 04/01/2018   Elevated liver function tests 01/18/2018   Weight loss 01/15/2017   Pain in limb 12/18/2016   Calculus of gallbladder without cholecystitis without obstruction 10/23/2016   Chronic venous insufficiency 10/16/2016   Varicose veins of both lower extremities with pain 10/16/2016   Lymphedema 10/16/2016   Epigastric pain 10/03/2016   Barrett's esophagus determined by endoscopy 01/25/2016   Vitamin B12 deficiency 10/18/2015   Chronic neck and back pain 08/19/2015   Allergic rhinitis with postnasal drip 07/09/2015   Dyslipidemia 05/05/2015   Acquired hypothyroidism 05/05/2015   Carcinoid tumor 05/05/2015   History of malignant carcinoid tumor of stomach 03/26/2012    PCP: Raina Bunting, DO  REFERRING PROVIDER: Domingo Friend *  REFERRING DIAG: R20.0,R20.2 (ICD-10-CM) - Numbness and tingling of both legs M54.2 (ICD-10-CM) - Neck pain G89.29,M54.42 (ICD-10-CM) - Chronic bilateral low back pain with left-sided sciatica  RATIONALE FOR EVALUATION AND TREATMENT: Rehabilitation  THERAPY DIAG: Other low back pain  Pain in thoracic spine  Muscle weakness (generalized)  ONSET DATE: Chronic (~20 years)   FOLLOW-UP APPT SCHEDULED  WITH REFERRING PROVIDER: No    SUBJECTIVE:                                                                                                                                                                                         SUBJECTIVE STATEMENT:  Chief Concern of Neck Pain and low back pain   PERTINENT HISTORY: Patient reports to OPPT with a chief complaint of neck and back pain.  Patient has been experiencing chronic LBP for ~20 yrs but symptoms have worsened in the last year. She reports termination from her job in 2023 and  within the last year she has been helping her son with care taking of her 2 grandchildren. Her son lives in a multistory home and she constantly has to perform stair navigation (15 stairs).  She reports increase in activities within the last year with care taking for her family and believes that the increase of activities has aggravated her back. Patient describes that currently her pain is located along her mid thoracic spine and radiates to her lumbar spine. Additionally, Pt reports that pain in her lumbar spine can radiates down the L posterior thigh and into the bottom of her foot. She describes the pain as sharp along the L leg and reports numbness and tingling in both feet. However she does have baseline neuropathy in her feet. She has occasional muscle spasms in both calves with increased activity throughout the day. She reports that she cannot take NSAIDS due to gastric conditions.  She denies changes to b/b, saddle anasthesia, abdominal pain, chills/fever, night sweats, nausea, vomiting,   Dominant hand: right  Imaging: Yes  CLINICAL DATA:  Chronic low back pain EXAM: LUMBAR SPINE - COMPLETE 4+ VIEW COMPARISON:  CT abdomen and pelvis 06/14/2017= FINDINGS: Normal anatomic alignment. No evidence for acute fracture or dislocation. Preservation of the vertebral body heights. Mild L5-S1 degenerative disc disease. Lower lumbar spine facet degenerative changes. Stool throughout the colon. Cholecystectomy clips. SI joints unremarkable. IMPRESSION: Lower lumbar spine degenerative disc and facet disease.  Electronically Signed   By: Jone Neither M.D.   On: 02/07/2024 23:18   PAIN:    Pain Intensity: Present: 6/10, Best: 3-4/10, Worst: 7-8/10 Pain location: Thoracic to Lumbar  Pain Quality: constant  Radiating: Yes  Focal Weakness: Yes Aggravating factors: Lifting, Bending, Kneeling Position Relieving factors: Walking and rest, lidocaine  patches 24-hour pain behavior: Activity Dependent,  worse in the evening How long can you sit: 15-20 How long can you stand: History of prior back injury, pain, surgery, or therapy: Yes  PRECAUTIONS: Fall  WEIGHT BEARING RESTRICTIONS: No  FALLS: Has patient fallen in last 6  months? No  Living Environment Lives with: lives with their family Lives in: House/apartment Stairs: No Has following equipment at home: None  Prior level of function: Independent  Occupational demands: Unemployed   Hobbies: Garden, Taking Care of [place] children, walking the dog   Patient Goals: "To have no pain in my feet and back"    OBJECTIVE:  Patient Surveys  Modified Oswestry 18 / 50 = 36.0 %    Cognition Patient is oriented to person, place, and time.  Recent memory is intact.  Remote memory is intact.  Attention span and concentration are intact.  Expressive speech is intact.  Patient's fund of knowledge is within normal limits for educational level.    Gross Musculoskeletal Assessment Tremor: None Bulk: Normal Tone: Normal  GAIT: Distance walked: 10 Assistive device utilized: None Level of assistance: Complete Independence Comments: Reciprocal WNL  Posture (Seated): Increased Thoracic Kyphosis Lumbar lordosis: WNL  AROM AROM (Normal range in degrees) AROM   Lumbar   Flexion (65) 100%  Extension (30) 100%  Right lateral flexion (25) 80% *   Left lateral flexion (25) 80%*   Right rotation (30) 100%  Left rotation (30) 100%      Hip Right Left  Flexion (125) WNL  WNL  Extension (15)    Abduction (40)    Adduction     Internal Rotation (45) 32 36  External Rotation (45)        Knee    Flexion (135)    Extension (0)        Ankle    Dorsiflexion (20)    Plantarflexion (50)    Inversion (35)    Eversion (15)    (* = pain; Blank rows = not tested)  LE MMT: MMT (out of 5) Right  Left   Hip flexion 4- 4-  Hip extension    Hip abduction 4- 4-  Hip adduction    Hip internal rotation    Hip external rotation     Knee flexion 4 4  Knee extension 4 4  Ankle dorsiflexion 5 5  Ankle plantarflexion 5 5  Ankle inversion    Ankle eversion    (* = pain; Blank rows = not tested)  Sensation Grossly intact to light touch throughout bilateral LEs as determined by testing dermatomes L2-S2. Proprioception, stereognosis, and hot/cold testing deferred on this date.  Reflexes Deferred   Muscle Length Hamstrings: R: Negative L: Negative  Palpation Location Right Left         Lumbar paraspinals 0 0  Quadratus Lumborum    Gluteus Maximus 0 0  Gluteus Medius 1 1  Deep hip external rotators 2 2  PSIS    Fortin's Area (SIJ)    Greater Trochanter    Thoracic Paraspinals (T7-T12)  1 1  Rhomboids  1  1  (Blank rows = not tested) Graded on 0-4 scale (0 = no pain, 1 = pain, 2 = pain with wincing/grimacing/flinching, 3 = pain with withdrawal, 4 = unwilling to allow palpation)  Passive Accessory Intervertebral Motion Pt with reproduction of back pain with CPA L1-L5 and bilaterally T6-T12. Generally, hypomobile throughout  Special Tests Lumbar Radiculopathy and Discogenic: Slump (SN 83, -LR 0.32): R: Deferred   L: Negative   SLR (SN 92, -LR 0.29): R: Deferred L:  Deferred  Crossed SLR (SP 90): R: Deferred   L: Deferred   Facet Joint: Extension-Rotation (SN 100, -LR 0.0): R: Negative L: Negative  Hip: FABER (SN 81): R: Negative L:  Negative FADIR (SN 94): R: Negative L: Negative Hip scour (SN 50): R:  L:    Functional Tasks Deep squat: Narrow BoS, limited Ankle DF  Forward Step-Down Test: R: Hip IR/ADD L:  Lateral Step-Down Test: R: Hip IR/ADD L:   30s Sit to Stand: 16 reps  TODAY'S TREATMENT: DATE: 04/15/2024  Subjective: Patient reports some improved pain in middle/low back (3-4/10 NPS) currently. Pain also reported in the bilateral foot (6/10 NPS). No questions or concerns.   Therapeutic Exercise:  Nustep x 5 min x Level 3-1 for warm up, LE strength and endurance; PT manually adjusted  resistance.   Supine Bridges   2 x 12   1 x 12 with Green TB around knees for increased gluteal activation   Hooklying Open/Close Thoracic Rotation    10x R/L direction    Dead bug    Static hold 1 x 15s    Static UE/Alternating LE: 1 x 10    Alternating LE against PT resistance at ankle: 2 x 10   Childs Pose for ROM and tissue extensibility a  4 x 10s   Bird Dog    2 x 10 Alternating UE/LE    - Multimodal cues for neutral pelvis and TrA activation  Seated Lumbar  Extensions for improved seated/standing posture  1 x 10  Therapeutic Activity: Kettlebell Squat for optimizing bending and lifting at home  1 x 8 20#, notable LE fatigue following 1st set   1 x 8 10#  Multimodal cues for proper hip hinge mechanics and less lumbar flexion    Pallof Press   1 x 10 x 5#   1 x 10 x 10#   Lateral step down (SUE Support)   R/L: 2 x 10 reps   Horizontal Wood Chop against resistance  Resistance from R/L:   1 x 10, Blue TB    PATIENT EDUCATION:  Education details: HEP, POC, Prognosis  Person educated: Patient Education method: Programmer, multimedia, Facilities manager, and Handouts Education comprehension: verbalized understanding and returned demonstration   HOME EXERCISE PROGRAM:   Access Code: [email or web address] URL: [email or web address] Date: 04/08/2024 Prepared by: Veryl Gottron Nasiah Polinsky  Exercises - Supine Figure 4 Piriformis Stretch  - 2 x daily - 7 x weekly - 3 sets - 30 hold - Child's Pose Stretch  - 2 x daily - 7 x weekly - 3 sets - 30 hold - Seated Hamstring Stretch  - 2 x daily - 7 x weekly - 3 sets - 30-60s hold - Supine Lower Trunk Rotation  - 1 x daily - 7 x weekly - 2-3 sets - 10-2 reps - Sidelying Open Book Thoracic Lumbar Rotation and Extension  - 1 x daily - 7 x weekly - 2-3 sets - 10-12 reps - Quadruped Thoracic Rotation - Reach Under  - 1 x daily - 7 x weekly - 3 sets - 10-12 reps - Supine Bridge  - 1 x daily - 7 x weekly - 3 sets - 10 reps - Squat with Resistance  at Thighs  - 1 x daily - 3-4 x weekly - 2-3 sets - 10-12 reps  Access Code: [email or web address] URL: [email or web address] Date: 03/31/2024 Prepared by: Satira Curet  Exercises - Supine Bridge  - 1 x daily - 7 x weekly - 3 sets - 10 reps - Supine Lower Trunk Rotation  - 1 x daily - 7 x weekly - 3 sets - 10 reps - Supine Figure 4 Piriformis Stretch  - 1 x daily - 7 x weekly -  3 sets - 10 reps - Sidelying Open Book Thoracic Lumbar Rotation and Extension  - 1 x daily - 7 x weekly - 3 sets - 10 reps - Seated Hamstring Stretch  - 1 x daily - 7 x weekly - 3 sets - 10 reps  ASSESSMENT:  CLINICAL IMPRESSION: Continued PT POC focused on improving thoracolumbar pain. Pt tolerated increased in intensity with core stabilization exercises. She demonstrated good hip hinge and lifting techniques but easily fatigued against resistance. Patient still presents with LE weakness, decreased activity tolerance and pain in her thoracolumbar spine. Pt endorsed improvements in pain at end of session. Based on today's performance pt will continue to benefit from skilled physical therapy in order to maximize return to PLOF and improve QoL.   OBJECTIVE IMPAIRMENTS: decreased activity tolerance, decreased strength, hypomobility, impaired flexibility, and pain.   ACTIVITY LIMITATIONS: carrying, lifting, bending, squatting, sleeping, transfers, and caring for others  PARTICIPATION LIMITATIONS: yard work  PERSONAL FACTORS: Age, Behavior pattern, Past/current experiences, and Time since onset of injury/illness/exacerbation are also affecting patient's functional outcome.   REHAB POTENTIAL: Good  CLINICAL DECISION MAKING: Evolving/moderate complexity  EVALUATION COMPLEXITY: Moderate   GOALS: Goals reviewed with patient? No  SHORT TERM GOALS: Target date: 05/27/2024  Pt will be independent with HEP in order to improve strength and decrease back pain to improve pain-free function at home and work. Baseline:  03/31/2024: Initial Provided  Goal status: INITIAL   LONG TERM GOALS: Target date: 07/08/2024  Pt will demonstrate ability to navigate 15 steps on stairs pain free to demonstrate significant improvement in functional movements and pain.  Baseline:  Goal status: INITIAL  2.  Pt will decrease worst back pain by at least 2 points on the NPRS in order to demonstrate clinically significant reduction in back pain. Baseline: 03/31/2024: 8/10 NPRS Goal status: INITIAL  3.  Pt will decrease mODI score by at least 13 points in order demonstrate clinically significant reduction in back pain/disability.       Baseline: 03/31/2024:  18 / 50 = 36.0 % (50/50 Max Disability)  Goal status: INITIAL  4.  Pt will increase in R/L internal ROM in order to demonstrate increased tissue extensibility and decrease in pain from ext Baseline: 03/31/2024 Internal Rotation (45) 32 36   Goal status: INITIAL  5.  Pt will increase 30s STS by 5 reps in order to demonstrate improvements in LE strength and endurance Baseline: 03/31/2024: 16 reps Goal status: INITIAL   PLAN: PT FREQUENCY: 1-2x/week  PT DURATION: 12 weeks  PLANNED INTERVENTIONS: Therapeutic exercises, Therapeutic activity, Neuromuscular re-education, Balance training, Gait training, Patient/Family education, Self Care, Joint mobilization, Joint manipulation, Vestibular training, Canalith repositioning, Orthotic/Fit training, DME instructions, Dry Needling, Electrical stimulation, Spinal manipulation, Spinal mobilization, Cryotherapy, Moist heat, Taping, Traction, Ultrasound, Ionotophoresis 4mg /ml Dexamethasone , Manual therapy, and Re-evaluation.  PLAN FOR NEXT SESSION:  Progress gluteal and core strengthening, Progress thoracolumbar mobility, functional strength, hip hinge training    Satira Curet PT, DPT Physical Therapist- [facility]  [facility]  04/15/2024, 12:31 PM

## 2024-04-16 ENCOUNTER — Ambulatory Visit

## 2024-04-16 DIAGNOSIS — M546 Pain in thoracic spine: Secondary | ICD-10-CM

## 2024-04-16 DIAGNOSIS — M5459 Other low back pain: Secondary | ICD-10-CM | POA: Diagnosis not present

## 2024-04-16 DIAGNOSIS — M5442 Lumbago with sciatica, left side: Secondary | ICD-10-CM | POA: Diagnosis not present

## 2024-04-16 DIAGNOSIS — M6281 Muscle weakness (generalized): Secondary | ICD-10-CM | POA: Diagnosis not present

## 2024-04-16 NOTE — Therapy (Signed)
 OUTPATIENT PHYSICAL THERAPY THORACOLUMBAR TREATMENT   Patient Name: Dana Hensley MRN: [id number] DOB:05/05/1961, 63 y.o., female Today's Date: 04/16/2024  END OF SESSION:  PT End of Session - 04/16/24 1116     Visit Number 4    Number of Visits 25    Date for PT Re-Evaluation 06/23/24    Authorization Type [employer] auth#[id number]    Authorization Time Period 5/13-7/12    Authorization - Visit Number 3    Authorization - Number of Visits 10    Progress Note Due on Visit 10    PT Start Time 1115    PT Stop Time 1157    PT Time Calculation (min) 42 min    Activity Tolerance Patient tolerated treatment well    Behavior During Therapy WFL for tasks assessed/performed              Past Medical History:  Diagnosis Date   Allergy    Anxiety    Asthma    B12 deficiency    Barrett esophagus    Chronic atrophic gastritis    DVT, lower extremity (HCC)    Gastric polyps    HLD (hyperlipidemia)    Hypothyroidism    IGT (impaired glucose tolerance)    Neuroendocrine tumor    carcinoid tumors of the stomach, s/p extraction surgery at [facility]   PONV (postoperative nausea and vomiting)    Vitamin D  deficiency    Past Surgical History:  Procedure Laterality Date   APPENDECTOMY     CHOLECYSTECTOMY N/A 11/16/2016   Procedure: LAPAROSCOPIC CHOLECYSTECTOMY WITH INTRAOPERATIVE CHOLANGIOGRAM;  Surgeon: Benancio Bracket, MD;  Location: [facility] ORS;  Service: General;  Laterality: N/A;   COLONOSCOPY WITH PROPOFOL  N/A 04/21/2021   Procedure: COLONOSCOPY WITH PROPOFOL ;  Surgeon: Marnee Sink, MD;  Location: [facility] ENDOSCOPY;  Service: Endoscopy;  Laterality: N/A;   DIAGNOSTIC LAPAROSCOPY     ESOPHAGOGASTRODUODENOSCOPY     TONSILLECTOMY     TOTAL ABDOMINAL HYSTERECTOMY W/ BILATERAL SALPINGOOPHORECTOMY  2012   due to ovar cysts/pain   VEIN LIGATION AND STRIPPING     Patient Active Problem List   Diagnosis Date Noted   Leg pain 02/04/2024   Other chest pain 08/02/2021   Chest pain  08/02/2021   Hx of colonic polyps    Hyperlipidemia, unspecified 08/29/2018   IGT (impaired glucose tolerance) 08/29/2018   Intrinsic atopic dermatitis 08/29/2018   History of adenomatous polyp of colon 04/01/2018   Elevated liver function tests 01/18/2018   Weight loss 01/15/2017   Pain in limb 12/18/2016   Calculus of gallbladder without cholecystitis without obstruction 10/23/2016   Chronic venous insufficiency 10/16/2016   Varicose veins of both lower extremities with pain 10/16/2016   Lymphedema 10/16/2016   Epigastric pain 10/03/2016   Barrett's esophagus determined by endoscopy 01/25/2016   Vitamin B12 deficiency 10/18/2015   Chronic neck and back pain 08/19/2015   Allergic rhinitis with postnasal drip 07/09/2015   Dyslipidemia 05/05/2015   Acquired hypothyroidism 05/05/2015   Carcinoid tumor 05/05/2015   History of malignant carcinoid tumor of stomach 03/26/2012    PCP: Raina Bunting, DO  REFERRING PROVIDER: Ludwig Safer, PA-C  REFERRING DIAG: R20.0,R20.2 (ICD-10-CM) - Numbness and tingling of both legs M54.2 (ICD-10-CM) - Neck pain G89.29,M54.42 (ICD-10-CM) - Chronic bilateral low back pain with left-sided sciatica  RATIONALE FOR EVALUATION AND TREATMENT: Rehabilitation  THERAPY DIAG: Other low back pain  Pain in thoracic spine  Muscle weakness (generalized)  ONSET DATE: Chronic (~20 years)   FOLLOW-UP APPT SCHEDULED  WITH REFERRING PROVIDER: No    SUBJECTIVE:                                                                                                                                                                                         SUBJECTIVE STATEMENT:  Chief Concern of Neck Pain and low back pain   PERTINENT HISTORY: Patient reports to OPPT with a chief complaint of neck and back pain.  Patient has been experiencing chronic LBP for ~20 yrs but symptoms have worsened in the last year. She reports termination from her job in 2023 and  within the last year she has been helping her son with care taking of her 2 grandchildren. Her son lives in a multistory home and she constantly has to perform stair navigation (15 stairs).  She reports increase in activities within the last year with care taking for her family and believes that the increase of activities has aggravated her back. Patient describes that currently her pain is located along her mid thoracic spine and radiates to her lumbar spine. Additionally, Pt reports that pain in her lumbar spine can radiates down the L posterior thigh and into the bottom of her foot. She describes the pain as sharp along the L leg and reports numbness and tingling in both feet. However she does have baseline neuropathy in her feet. She has occasional muscle spasms in both calves with increased activity throughout the day. She reports that she cannot take NSAIDS due to gastric conditions.  She denies changes to b/b, saddle anasthesia, abdominal pain, chills/fever, night sweats, nausea, vomiting,   Dominant hand: right  Imaging: Yes  CLINICAL DATA:  Chronic low back pain EXAM: LUMBAR SPINE - COMPLETE 4+ VIEW COMPARISON:  CT abdomen and pelvis 06/14/2017= FINDINGS: Normal anatomic alignment. No evidence for acute fracture or dislocation. Preservation of the vertebral body heights. Mild L5-S1 degenerative disc disease. Lower lumbar spine facet degenerative changes. Stool throughout the colon. Cholecystectomy clips. SI joints unremarkable. IMPRESSION: Lower lumbar spine degenerative disc and facet disease.  Electronically Signed   By: Jone Neither M.D.   On: 02/07/2024 23:18   PAIN:    Pain Intensity: Present: 6/10, Best: 3-4/10, Worst: 7-8/10 Pain location: Thoracic to Lumbar  Pain Quality: constant  Radiating: Yes  Focal Weakness: Yes Aggravating factors: Lifting, Bending, Kneeling Position Relieving factors: Walking and rest, lidocaine  patches 24-hour pain behavior: Activity Dependent,  worse in the evening How long can you sit: 15-20 How long can you stand: History of prior back injury, pain, surgery, or therapy: Yes  PRECAUTIONS: Fall  WEIGHT BEARING RESTRICTIONS: No  FALLS: Has patient fallen in last 6  months? No  Living Environment Lives with: lives with their family Lives in: House/apartment Stairs: No Has following equipment at home: None  Prior level of function: Independent  Occupational demands: Unemployed   Hobbies: Garden, Taking Care of [place] children, walking the dog   Patient Goals: "To have no pain in my feet and back"    OBJECTIVE:  Patient Surveys  Modified Oswestry 18 / 50 = 36.0 %    Cognition Patient is oriented to person, place, and time.  Recent memory is intact.  Remote memory is intact.  Attention span and concentration are intact.  Expressive speech is intact.  Patient's fund of knowledge is within normal limits for educational level.    Gross Musculoskeletal Assessment Tremor: None Bulk: Normal Tone: Normal  GAIT: Distance walked: 10 Assistive device utilized: None Level of assistance: Complete Independence Comments: Reciprocal WNL  Posture (Seated): Increased Thoracic Kyphosis Lumbar lordosis: WNL  AROM AROM (Normal range in degrees) AROM   Lumbar   Flexion (65) 100%  Extension (30) 100%  Right lateral flexion (25) 80% *   Left lateral flexion (25) 80%*   Right rotation (30) 100%  Left rotation (30) 100%      Hip Right Left  Flexion (125) WNL  WNL  Extension (15)    Abduction (40)    Adduction     Internal Rotation (45) 32 36  External Rotation (45)        Knee    Flexion (135)    Extension (0)        Ankle    Dorsiflexion (20)    Plantarflexion (50)    Inversion (35)    Eversion (15)    (* = pain; Blank rows = not tested)  LE MMT: MMT (out of 5) Right  Left   Hip flexion 4- 4-  Hip extension    Hip abduction 4- 4-  Hip adduction    Hip internal rotation    Hip external rotation     Knee flexion 4 4  Knee extension 4 4  Ankle dorsiflexion 5 5  Ankle plantarflexion 5 5  Ankle inversion    Ankle eversion    (* = pain; Blank rows = not tested)  Sensation Grossly intact to light touch throughout bilateral LEs as determined by testing dermatomes L2-S2. Proprioception, stereognosis, and hot/cold testing deferred on this date.  Reflexes Deferred   Muscle Length Hamstrings: R: Negative L: Negative  Palpation Location Right Left         Lumbar paraspinals 0 0  Quadratus Lumborum    Gluteus Maximus 0 0  Gluteus Medius 1 1  Deep hip external rotators 2 2  PSIS    Fortin's Area (SIJ)    Greater Trochanter    Thoracic Paraspinals (T7-T12)  1 1  Rhomboids  1  1  (Blank rows = not tested) Graded on 0-4 scale (0 = no pain, 1 = pain, 2 = pain with wincing/grimacing/flinching, 3 = pain with withdrawal, 4 = unwilling to allow palpation)  Passive Accessory Intervertebral Motion Pt with reproduction of back pain with CPA L1-L5 and bilaterally T6-T12. Generally, hypomobile throughout  Special Tests Lumbar Radiculopathy and Discogenic: Slump (SN 83, -LR 0.32): R: Deferred   L: Negative   SLR (SN 92, -LR 0.29): R: Deferred L:  Deferred  Crossed SLR (SP 90): R: Deferred   L: Deferred   Facet Joint: Extension-Rotation (SN 100, -LR 0.0): R: Negative L: Negative  Hip: FABER (SN 81): R: Negative L:  Negative FADIR (SN 94): R: Negative L: Negative Hip scour (SN 50): R:  L:    Functional Tasks Deep squat: Narrow BoS, limited Ankle DF  Forward Step-Down Test: R: Hip IR/ADD L:  Lateral Step-Down Test: R: Hip IR/ADD L:   30s Sit to Stand: 16 reps  TODAY'S TREATMENT: DATE: 04/15/2024  Subjective: Patient reports some improved pain in middle lower back (3/10 NPS) currently. She reports that her back pain has been improving since her initial POC.  No questions or concerns.   Therapeutic Exercise:  Supine Bridges   2 x 10 with anti rotation - Green TB   1 x 10 with  anti rotation - Blue TB    Dead bug  Alternating LE with BUE Overhead press (4# DB) 3 x 10   Bird Dog    3 x 10 Alternating UE/LE     [employer] (in between [employer])    30s/bout x 2 in order to improve ROM and tissue extensibility    Quadruped Thoracic Rotation - Reach Under   R/L UE 1 x 10 ea  Therapeutic Activity: STS from Mat Table with Over head Press for transfers and overhead lifting  3 x 10, 3Kg   Forward Step Down for increasing capacity of stair navigation at home  R/L: 2 x 10 Single UE    Lateral Lunge onto 1st step with Anti-Rotation   R/L LE: 2 x 10 Blue TB   Reverse Lunge with Shoulder Low Row against resistance   R/L : 1 x 8 ea leg (Red Theraband)   PATIENT EDUCATION:  Education details: HEP, POC, Prognosis  Person educated: Patient Education method: Explanation, Demonstration, and Handouts Education comprehension: verbalized understanding and returned demonstration   HOME EXERCISE PROGRAM:   Access Code: [email or web address] URL: [email or web address] Date: 04/08/2024 Prepared by: Veryl Gottron Marinna Blane  Exercises - Supine Figure 4 Piriformis Stretch  - 2 x daily - 7 x weekly - 3 sets - 30 hold - Child's Pose Stretch  - 2 x daily - 7 x weekly - 3 sets - 30 hold - Seated Hamstring Stretch  - 2 x daily - 7 x weekly - 3 sets - 30-60s hold - Supine Lower Trunk Rotation  - 1 x daily - 7 x weekly - 2-3 sets - 10-2 reps - Sidelying Open Book Thoracic Lumbar Rotation and Extension  - 1 x daily - 7 x weekly - 2-3 sets - 10-12 reps - Quadruped Thoracic Rotation - Reach Under  - 1 x daily - 7 x weekly - 3 sets - 10-12 reps - Supine Bridge  - 1 x daily - 7 x weekly - 3 sets - 10 reps - Squat with Resistance at Thighs  - 1 x daily - 3-4 x weekly - 2-3 sets - 10-12 reps  Access Code: [email or web address] URL: [email or web address] Date: 03/31/2024 Prepared by: Satira Curet  Exercises - Supine Bridge  - 1 x daily - 7 x weekly - 3 sets - 10 reps - Supine  Lower Trunk Rotation  - 1 x daily - 7 x weekly - 3 sets - 10 reps - Supine Figure 4 Piriformis Stretch  - 1 x daily - 7 x weekly - 3 sets - 10 reps - Sidelying Open Book Thoracic Lumbar Rotation and Extension  - 1 x daily - 7 x weekly - 3 sets - 10 reps - Seated Hamstring Stretch  - 1 x daily - 7 x weekly - 3 sets - 10 reps  ASSESSMENT:  CLINICAL IMPRESSION: Continued PT POC focused on improving thoracolumbar pain and functional strengthening. She tolerated increase in compound exercises targeting LE strength. Patient's core stabilization has steadily improved and her lower back pain has steadily decreased. Patient still presents with LE weakness, decreased activity tolerance and pain in her thoracolumbar spine. Pt endorsed improvements in pain at end of session. Based on today's performance pt will continue to benefit from skilled physical therapy in order to maximize return to PLOF and improve QoL.   OBJECTIVE IMPAIRMENTS: decreased activity tolerance, decreased strength, hypomobility, impaired flexibility, and pain.   ACTIVITY LIMITATIONS: carrying, lifting, bending, squatting, sleeping, transfers, and caring for others  PARTICIPATION LIMITATIONS: yard work  PERSONAL FACTORS: Age, Behavior pattern, Past/current experiences, and Time since onset of injury/illness/exacerbation are also affecting patient's functional outcome.   REHAB POTENTIAL: Good  CLINICAL DECISION MAKING: Evolving/moderate complexity  EVALUATION COMPLEXITY: Moderate   GOALS: Goals reviewed with patient? No  SHORT TERM GOALS: Target date: 05/28/2024  Pt will be independent with HEP in order to improve strength and decrease back pain to improve pain-free function at home and work. Baseline: 03/31/2024: Initial Provided  Goal status: INITIAL   LONG TERM GOALS: Target date: 07/09/2024  Pt will demonstrate ability to navigate 15 steps on stairs pain free to demonstrate significant improvement in functional movements  and pain.  Baseline:  Goal status: INITIAL  2.  Pt will decrease worst back pain by at least 2 points on the NPRS in order to demonstrate clinically significant reduction in back pain. Baseline: 03/31/2024: 8/10 NPRS Goal status: INITIAL  3.  Pt will decrease mODI score by at least 13 points in order demonstrate clinically significant reduction in back pain/disability.       Baseline: 03/31/2024:  18 / 50 = 36.0 % (50/50 Max Disability)  Goal status: INITIAL  4.  Pt will increase in R/L internal ROM in order to demonstrate increased tissue extensibility and decrease in pain from ext Baseline: 03/31/2024 Internal Rotation (45) 32 36   Goal status: INITIAL  5.  Pt will increase 30s STS by 5 reps in order to demonstrate improvements in LE strength and endurance Baseline: 03/31/2024: 16 reps Goal status: INITIAL   PLAN: PT FREQUENCY: 1-2x/week  PT DURATION: 12 weeks  PLANNED INTERVENTIONS: Therapeutic exercises, Therapeutic activity, Neuromuscular re-education, Balance training, Gait training, Patient/Family education, Self Care, Joint mobilization, Joint manipulation, Vestibular training, Canalith repositioning, Orthotic/Fit training, DME instructions, Dry Needling, Electrical stimulation, Spinal manipulation, Spinal mobilization, Cryotherapy, Moist heat, Taping, Traction, Ultrasound, Ionotophoresis 4mg /ml Dexamethasone , Manual therapy, and Re-evaluation.  PLAN FOR NEXT SESSION:  Progress gluteal and core strengthening, Progress thoracolumbar mobility, progress functional strengthening   Satira Curet PT, DPT Physical Therapist- [facility]  04/16/2024, 12:11 PM

## 2024-04-17 ENCOUNTER — Encounter

## 2024-04-17 DIAGNOSIS — J3089 Other allergic rhinitis: Secondary | ICD-10-CM | POA: Diagnosis not present

## 2024-04-17 DIAGNOSIS — H1045 Other chronic allergic conjunctivitis: Secondary | ICD-10-CM | POA: Diagnosis not present

## 2024-04-17 DIAGNOSIS — J301 Allergic rhinitis due to pollen: Secondary | ICD-10-CM | POA: Diagnosis not present

## 2024-04-17 DIAGNOSIS — J3081 Allergic rhinitis due to animal (cat) (dog) hair and dander: Secondary | ICD-10-CM | POA: Diagnosis not present

## 2024-04-24 ENCOUNTER — Ambulatory Visit

## 2024-04-24 DIAGNOSIS — M5459 Other low back pain: Secondary | ICD-10-CM

## 2024-04-24 DIAGNOSIS — M5442 Lumbago with sciatica, left side: Secondary | ICD-10-CM | POA: Diagnosis not present

## 2024-04-24 DIAGNOSIS — M6281 Muscle weakness (generalized): Secondary | ICD-10-CM

## 2024-04-24 DIAGNOSIS — M546 Pain in thoracic spine: Secondary | ICD-10-CM

## 2024-04-24 NOTE — Therapy (Signed)
 OUTPATIENT PHYSICAL THERAPY THORACOLUMBAR TREATMENT   Patient Name: Dana Hensley  Dana Hensley MRN: [id number] DOB:[birth year], 63 y.o., female Today's Date: 04/24/2024  END OF SESSION:  PT End of Session - 04/24/24 0816     Visit Number 5    Number of Visits 25    Date for PT Re-Evaluation 06/23/24    Authorization Type [employer] auth#[id number]    Authorization Time Period 5/13-7/12    Authorization - Visit Number 4    Authorization - Number of Visits 10    Progress Note Due on Visit 10    PT Start Time 0815    PT Stop Time 0855    PT Time Calculation (min) 40 min    Activity Tolerance Patient tolerated treatment well    Behavior During Therapy WFL for tasks assessed/performed              Past Medical History:  Diagnosis Date   Allergy    Anxiety    Asthma    B12 deficiency    Barrett esophagus    Chronic atrophic gastritis    DVT, lower extremity (HCC)    Gastric polyps    HLD (hyperlipidemia)    Hypothyroidism    IGT (impaired glucose tolerance)    Neuroendocrine tumor    carcinoid tumors of the stomach, s/p extraction surgery at [facility]   PONV (postoperative nausea and vomiting)    Vitamin D  deficiency    Past Surgical History:  Procedure Laterality Date   APPENDECTOMY     CHOLECYSTECTOMY N/A 11/16/2016   Procedure: LAPAROSCOPIC CHOLECYSTECTOMY WITH INTRAOPERATIVE CHOLANGIOGRAM;  Surgeon: Benancio Bracket, MD;  Location: [facility] ORS;  Service: General;  Laterality: N/A;   COLONOSCOPY WITH PROPOFOL  N/A 04/21/2021   Procedure: COLONOSCOPY WITH PROPOFOL ;  Surgeon: Marnee Sink, MD;  Location: [facility] ENDOSCOPY;  Service: Endoscopy;  Laterality: N/A;   DIAGNOSTIC LAPAROSCOPY     ESOPHAGOGASTRODUODENOSCOPY     TONSILLECTOMY     TOTAL ABDOMINAL HYSTERECTOMY W/ BILATERAL SALPINGOOPHORECTOMY  2012   due to ovar cysts/pain   VEIN LIGATION AND STRIPPING     Patient Active Problem List   Diagnosis Date Noted   Leg pain 02/04/2024   Other chest pain 08/02/2021   Chest pain  08/02/2021   Hx of colonic polyps    Hyperlipidemia, unspecified 08/29/2018   IGT (impaired glucose tolerance) 08/29/2018   Intrinsic atopic dermatitis 08/29/2018   History of adenomatous polyp of colon 04/01/2018   Elevated liver function tests 01/18/2018   Weight loss 01/15/2017   Pain in limb 12/18/2016   Calculus of gallbladder without cholecystitis without obstruction 10/23/2016   Chronic venous insufficiency 10/16/2016   Varicose veins of both lower extremities with pain 10/16/2016   Lymphedema 10/16/2016   Epigastric pain 10/03/2016   Barrett's esophagus determined by endoscopy 01/25/2016   Vitamin B12 deficiency 10/18/2015   Chronic neck and back pain 08/19/2015   Allergic rhinitis with postnasal drip 07/09/2015   Dyslipidemia 05/05/2015   Acquired hypothyroidism 05/05/2015   Carcinoid tumor 05/05/2015   History of malignant carcinoid tumor of stomach 03/26/2012    PCP: Raina Bunting, DO  REFERRING PROVIDER: Ludwig Safer, PA-C  REFERRING DIAG: R20.0,R20.2 (ICD-10-CM) - Numbness and tingling of both legs M54.2 (ICD-10-CM) - Neck pain G89.29,M54.42 (ICD-10-CM) - Chronic bilateral low back pain with left-sided sciatica  RATIONALE FOR EVALUATION AND TREATMENT: Rehabilitation  THERAPY DIAG: No diagnosis found.  ONSET DATE: Chronic (~20 years)   FOLLOW-UP APPT SCHEDULED WITH REFERRING PROVIDER: No    SUBJECTIVE:  SUBJECTIVE STATEMENT:  Chief Concern of Neck Pain and low back pain   PERTINENT HISTORY: Patient reports to OPPT with a chief complaint of neck and back pain.  Patient has been experiencing chronic LBP for ~20 yrs but symptoms have worsened in the last year. She reports termination from her job in 2023 and within the last year she has been helping her son with care  taking of her 2 grandchildren. Her son lives in a multistory home and she constantly has to perform stair navigation (15 stairs).  She reports increase in activities within the last year with care taking for her family and believes that the increase of activities has aggravated her back. Patient describes that currently her pain is located along her mid thoracic spine and radiates to her lumbar spine. Additionally, Pt reports that pain in her lumbar spine can radiates down the L posterior thigh and into the bottom of her foot. She describes the pain as sharp along the L leg and reports numbness and tingling in both feet. However she does have baseline neuropathy in her feet. She has occasional muscle spasms in both calves with increased activity throughout the day. She reports that she cannot take NSAIDS due to gastric conditions.  She denies changes to b/b, saddle anasthesia, abdominal pain, chills/fever, night sweats, nausea, vomiting,   Dominant hand: right  Imaging: Yes  CLINICAL DATA:  Chronic low back pain EXAM: LUMBAR SPINE - COMPLETE 4+ VIEW COMPARISON:  CT abdomen and pelvis 06/14/2017= FINDINGS: Normal anatomic alignment. No evidence for acute fracture or dislocation. Preservation of the vertebral body heights. Mild L5-S1 degenerative disc disease. Lower lumbar spine facet degenerative changes. Stool throughout the colon. Cholecystectomy clips. SI joints unremarkable. IMPRESSION: Lower lumbar spine degenerative disc and facet disease.  Electronically Signed   By: Jone Neither M.D.   On: 02/07/2024 23:18   PAIN:    Pain Intensity: Present: 6/10, Best: 3-4/10, Worst: 7-8/10 Pain location: Thoracic to Lumbar  Pain Quality: constant  Radiating: Yes  Focal Weakness: Yes Aggravating factors: Lifting, Bending, Kneeling Position Relieving factors: Walking and rest, lidocaine  patches 24-hour pain behavior: Activity Dependent, worse in the evening How long can you sit: 15-20 How long  can you stand: History of prior back injury, pain, surgery, or therapy: Yes  PRECAUTIONS: Fall  WEIGHT BEARING RESTRICTIONS: No  FALLS: Has patient fallen in last 6 months? No  Living Environment Lives with: lives with their family Lives in: House/apartment Stairs: No Has following equipment at home: None  Prior level of function: Independent  Occupational demands: Unemployed   Hobbies: Garden, Taking Care of [place] children, walking the dog   Patient Goals: "To have no pain in my feet and back"    OBJECTIVE:  Patient Surveys  Modified Oswestry 18 / 50 = 36.0 %    Cognition Patient is oriented to person, place, and time.  Recent memory is intact.  Remote memory is intact.  Attention span and concentration are intact.  Expressive speech is intact.  Patient's fund of knowledge is within normal limits for educational level.    Gross Musculoskeletal Assessment Tremor: None Bulk: Normal Tone: Normal  GAIT: Distance walked: 10 Assistive device utilized: None Level of assistance: Complete Independence Comments: Reciprocal WNL  Posture (Seated): Increased Thoracic Kyphosis Lumbar lordosis: WNL  AROM AROM (Normal range in degrees) AROM   Lumbar   Flexion (65) 100%  Extension (30) 100%  Right lateral flexion (25) 80% *   Left lateral flexion (25) 80%*  Right rotation (30) 100%  Left rotation (30) 100%      Hip Right Left  Flexion (125) WNL  WNL  Extension (15)    Abduction (40)    Adduction     Internal Rotation (45) 32 36  External Rotation (45)        Knee    Flexion (135)    Extension (0)        Ankle    Dorsiflexion (20)    Plantarflexion (50)    Inversion (35)    Eversion (15)    (* = pain; Blank rows = not tested)  LE MMT: MMT (out of 5) Right  Left   Hip flexion 4- 4-  Hip extension    Hip abduction 4- 4-  Hip adduction    Hip internal rotation    Hip external rotation    Knee flexion 4 4  Knee extension 4 4  Ankle dorsiflexion  5 5  Ankle plantarflexion 5 5  Ankle inversion    Ankle eversion    (* = pain; Blank rows = not tested)  Sensation Grossly intact to light touch throughout bilateral LEs as determined by testing dermatomes L2-S2. Proprioception, stereognosis, and hot/cold testing deferred on this date.  Reflexes Deferred   Muscle Length Hamstrings: R: Negative L: Negative  Palpation Location Right Left         Lumbar paraspinals 0 0  Quadratus Lumborum    Gluteus Maximus 0 0  Gluteus Medius 1 1  Deep hip external rotators 2 2  PSIS    Fortin's Area (SIJ)    Greater Trochanter    Thoracic Paraspinals (T7-T12)  1 1  Rhomboids  1  1  (Blank rows = not tested) Graded on 0-4 scale (0 = no pain, 1 = pain, 2 = pain with wincing/grimacing/flinching, 3 = pain with withdrawal, 4 = unwilling to allow palpation)  Passive Accessory Intervertebral Motion Pt with reproduction of back pain with CPA L1-L5 and bilaterally T6-T12. Generally, hypomobile throughout  Special Tests Lumbar Radiculopathy and Discogenic: Slump (SN 83, -LR 0.32): R: Deferred   L: Negative   SLR (SN 92, -LR 0.29): R: Deferred L:  Deferred  Crossed SLR (SP 90): R: Deferred   L: Deferred   Facet Joint: Extension-Rotation (SN 100, -LR 0.0): R: Negative L: Negative  Hip: FABER (SN 81): R: Negative L: Negative FADIR (SN 94): R: Negative L: Negative Hip scour (SN 50): R:  L:    Functional Tasks Deep squat: Narrow BoS, limited Ankle DF  Forward Step-Down Test: R: Hip IR/ADD L:  Lateral Step-Down Test: R: Hip IR/ADD L:   30s Sit to Stand: 16 reps  TODAY'S TREATMENT: DATE: 04/24/2024  Subjective: Patient reported visiting [employer] in order to find proper foot wear to address foot pain. She did purchase New Balances which have helped improved her foot pain. 0/10 NPS in lower back pain. 2-3/10 NPS in the middle back. Patient has had recent massage therapy in order to reduce muscle tension.  No questions or concerns.    Therapeutic Exercise:  Supine Bridges   2 x 10, feet placed closer to hip to reduce hamstring spasm    2 x 10, Feet placed on reverse bosu with PT perturbations   Supine Dead Bug with Resistance (Green TB)    3 x 10   Supine Flutter Kicks  4 x 10s, PT added manual resistance at distal leg last 2 sets     Seated Pension scheme manager  Stretch - [employer] technologist    R/L Rotation: 10x ea     Seated Overhead Reach for thoracic tissue lengthening    R/L 1 x 10 x 2s hold ea dir   Seated Lat Pull Down for latissimus stretch and strengthening   1 x 10 15#    2 x 10 25#, decreased mid back pain    Therapeutic Activity: Stairway navigation without rail support   4 steps x 4  Forward Step Up (8" Step up) for increasing capacity of ascending with stair navigation at home   R/L: 2 x 10 Forward Step Down (8" Step up)  for increasing capacity of descending stair navigation at home  R/L: 2 x 10 Single UE Support    PATIENT EDUCATION:  Education details: HEP, Exercise Technique  Person educated: Patient Education method: Explanation, Demonstration, and Handouts Education comprehension: verbalized understanding and returned demonstration   HOME EXERCISE PROGRAM:   Access Code: [email or web address] URL: [email or web address] Date: 04/08/2024 Prepared by: Veryl Gottron Hisham Provence  Exercises - Supine Figure 4 Piriformis Stretch  - 2 x daily - 7 x weekly - 3 sets - 30 hold - Child's Pose Stretch  - 2 x daily - 7 x weekly - 3 sets - 30 hold - Seated Hamstring Stretch  - 2 x daily - 7 x weekly - 3 sets - 30-60s hold - Supine Lower Trunk Rotation  - 1 x daily - 7 x weekly - 2-3 sets - 10-2 reps - Sidelying Open Book Thoracic Lumbar Rotation and Extension  - 1 x daily - 7 x weekly - 2-3 sets - 10-12 reps - Quadruped Thoracic Rotation - Reach Under  - 1 x daily - 7 x weekly - 3 sets - 10-12 reps - Supine Bridge  - 1 x daily - 7 x weekly - 3 sets - 10 reps - Squat with Resistance at Thighs  - 1 x  daily - 3-4 x weekly - 2-3 sets - 10-12 reps  Access Code: [email or web address] URL: [email or web address] Date: 03/31/2024 Prepared by: Satira Curet  Exercises - Supine Bridge  - 1 x daily - 7 x weekly - 3 sets - 10 reps - Supine Lower Trunk Rotation  - 1 x daily - 7 x weekly - 3 sets - 10 reps - Supine Figure 4 Piriformis Stretch  - 1 x daily - 7 x weekly - 3 sets - 10 reps - Sidelying Open Book Thoracic Lumbar Rotation and Extension  - 1 x daily - 7 x weekly - 3 sets - 10 reps - Seated Hamstring Stretch  - 1 x daily - 7 x weekly - 3 sets - 10 reps  ASSESSMENT:  CLINICAL IMPRESSION: Continued PT POC focused on improving thoracolumbar pain and functional strengthening. Patient's lower back pain properly managed and has improved since her initial visit. However she still presents with moderate deficits in activity tolerance, muscular endurance and thoracic pain. She tolerated progressions to core stability exercises without lumbar pain. PT simulated stairs at home with (8" step up) and she presents with Hip IR/ADD bilaterally and heavy reliance of UE due to LE weakness. Patient still presents with LE weakness, decreased activity tolerance and pain in her thoracolumbar spine. Pt endorsed improvements in pain at end of session. Based on today's performance pt will continue to benefit from skilled physical therapy in order to maximize return to PLOF and improve QoL.   OBJECTIVE IMPAIRMENTS: decreased activity tolerance, decreased strength, hypomobility, impaired flexibility, and pain.   ACTIVITY LIMITATIONS: carrying,  lifting, bending, squatting, sleeping, transfers, and caring for others  PARTICIPATION LIMITATIONS: yard work  PERSONAL FACTORS: Age, Behavior pattern, Past/current experiences, and Time since onset of injury/illness/exacerbation are also affecting patient's functional outcome.   REHAB POTENTIAL: Good  CLINICAL DECISION MAKING: Evolving/moderate complexity  EVALUATION  COMPLEXITY: Moderate   GOALS: Goals reviewed with patient? No  SHORT TERM GOALS: Target date: 06/05/2024  Pt will be independent with HEP in order to improve strength and decrease back pain to improve pain-free function at home and work. Baseline: 03/31/2024: Initial Provided  Goal status: INITIAL   LONG TERM GOALS: Target date: 07/17/2024  Pt will demonstrate ability to navigate 15 steps on stairs pain free to demonstrate significant improvement in functional movements and pain.  Baseline: Navigated 16 steps (6") without rail support pain free Goal status: Goal MET  2.  Pt will decrease worst back pain by at least 2 points on the NPRS in order to demonstrate clinically significant reduction in back pain. Baseline: 03/31/2024: 8/10 NPRS Goal status: INITIAL  3.  Pt will decrease mODI score by at least 13 points in order demonstrate clinically significant reduction in back pain/disability.       Baseline: 03/31/2024:  18 / 50 = 36.0 % (50/50 Max Disability)  Goal status: INITIAL  4.  Pt will increase in R/L internal ROM in order to demonstrate increased tissue extensibility and decrease in pain from ext Baseline: 03/31/2024 Internal Rotation (45) 32 36   Goal status: INITIAL  5.  Pt will increase 30s STS by 5 reps in order to demonstrate improvements in LE strength and endurance Baseline: 03/31/2024: 16 reps Goal status: INITIAL   PLAN: PT FREQUENCY: 1-2x/week  PT DURATION: 12 weeks  PLANNED INTERVENTIONS: Therapeutic exercises, Therapeutic activity, Neuromuscular re-education, Balance training, Gait training, Patient/Family education, Self Care, Joint mobilization, Joint manipulation, Vestibular training, Canalith repositioning, Orthotic/Fit training, DME instructions, Dry Needling, Electrical stimulation, Spinal manipulation, Spinal mobilization, Cryotherapy, Moist heat, Taping, Traction, Ultrasound, Ionotophoresis 4mg /ml Dexamethasone , Manual therapy, and  Re-evaluation.  PLAN FOR NEXT SESSION:  Progress gluteal and core strengthening, Progress thoracolumbar mobility, progress functional strengthening   Satira Curet PT, DPT Physical Therapist- [facility]  [facility]  04/24/2024, 8:54 AM

## 2024-04-28 ENCOUNTER — Ambulatory Visit

## 2024-04-30 DIAGNOSIS — R200 Anesthesia of skin: Secondary | ICD-10-CM | POA: Diagnosis not present

## 2024-05-01 ENCOUNTER — Ambulatory Visit

## 2024-05-05 ENCOUNTER — Ambulatory Visit: Attending: Physician Assistant

## 2024-05-05 DIAGNOSIS — M6281 Muscle weakness (generalized): Secondary | ICD-10-CM | POA: Diagnosis not present

## 2024-05-05 DIAGNOSIS — M5459 Other low back pain: Secondary | ICD-10-CM | POA: Diagnosis not present

## 2024-05-05 DIAGNOSIS — M546 Pain in thoracic spine: Secondary | ICD-10-CM | POA: Diagnosis not present

## 2024-05-05 NOTE — Therapy (Signed)
 OUTPATIENT PHYSICAL THERAPY THORACOLUMBAR TREATMENT   Patient Name: Dana Hensley MRN: [id number] DOB:1961-03-26, 63 y.o., female Today's Date: 05/05/2024  END OF SESSION:  PT End of Session - 05/05/24 1120     Visit Number 6    Number of Visits 25    Date for PT Re-Evaluation 06/23/24    Authorization Type [employer] auth#[id number]    Authorization Time Period 5/13-7/12    Authorization - Number of Visits 10    Progress Note Due on Visit 10    PT Start Time 1117    PT Stop Time 1155    PT Time Calculation (min) 38 min    Activity Tolerance Patient tolerated treatment well    Behavior During Therapy WFL for tasks assessed/performed              Past Medical History:  Diagnosis Date   Allergy    Anxiety    Asthma    B12 deficiency    Barrett esophagus    Chronic atrophic gastritis    DVT, lower extremity (HCC)    Gastric polyps    HLD (hyperlipidemia)    Hypothyroidism    IGT (impaired glucose tolerance)    Neuroendocrine tumor    carcinoid tumors of the stomach, s/p extraction surgery at [facility]   PONV (postoperative nausea and vomiting)    Vitamin D  deficiency    Past Surgical History:  Procedure Laterality Date   APPENDECTOMY     CHOLECYSTECTOMY N/A 11/16/2016   Procedure: LAPAROSCOPIC CHOLECYSTECTOMY WITH INTRAOPERATIVE CHOLANGIOGRAM;  Surgeon: Benancio Bracket, MD;  Location: [facility] ORS;  Service: General;  Laterality: N/A;   COLONOSCOPY WITH PROPOFOL  N/A 04/21/2021   Procedure: COLONOSCOPY WITH PROPOFOL ;  Surgeon: Marnee Sink, MD;  Location: [facility];  Service: Endoscopy;  Laterality: N/A;   DIAGNOSTIC LAPAROSCOPY     ESOPHAGOGASTRODUODENOSCOPY     TONSILLECTOMY     TOTAL ABDOMINAL HYSTERECTOMY W/ BILATERAL SALPINGOOPHORECTOMY  2012   due to ovar cysts/pain   VEIN LIGATION AND STRIPPING     Patient Active Problem List   Diagnosis Date Noted   Leg pain 02/04/2024   Other chest pain 08/02/2021   Chest pain 08/02/2021   Hx of colonic polyps     Hyperlipidemia, unspecified 08/29/2018   IGT (impaired glucose tolerance) 08/29/2018   Intrinsic atopic dermatitis 08/29/2018   History of adenomatous polyp of colon 04/01/2018   Elevated liver function tests 01/18/2018   Weight loss 01/15/2017   Pain in limb 12/18/2016   Calculus of gallbladder without cholecystitis without obstruction 10/23/2016   Chronic venous insufficiency 10/16/2016   Varicose veins of both lower extremities with pain 10/16/2016   Lymphedema 10/16/2016   Epigastric pain 10/03/2016   Barrett's esophagus determined by endoscopy 01/25/2016   Vitamin B12 deficiency 10/18/2015   Chronic neck and back pain 08/19/2015   Allergic rhinitis with postnasal drip 07/09/2015   Dyslipidemia 05/05/2015   Acquired hypothyroidism 05/05/2015   Carcinoid tumor 05/05/2015   History of malignant carcinoid tumor of stomach 03/26/2012    PCP: Raina Bunting, DO  REFERRING PROVIDER: Ludwig Safer, PA-C  REFERRING DIAG: R20.0,R20.2 (ICD-10-CM) - Numbness and tingling of both legs M54.2 (ICD-10-CM) - Neck pain G89.29,M54.42 (ICD-10-CM) - Chronic bilateral low back pain with left-sided sciatica  RATIONALE FOR EVALUATION AND TREATMENT: Rehabilitation  THERAPY DIAG: Other low back pain  Pain in thoracic spine  Muscle weakness (generalized)  ONSET DATE: Chronic (~20 years)   FOLLOW-UP APPT SCHEDULED WITH REFERRING PROVIDER: No    SUBJECTIVE:  SUBJECTIVE STATEMENT:  Chief Concern of Neck Pain and low back pain   PERTINENT HISTORY: Patient reports to OPPT with a chief complaint of neck and back pain.  Patient has been experiencing chronic LBP for ~20 yrs but symptoms have worsened in the last year. She reports termination from her job in 2023 and within the last year she has been  helping her son with care taking of her 2 grandchildren. Her son lives in a multistory home and she constantly has to perform stair navigation (15 stairs).  She reports increase in activities within the last year with care taking for her family and believes that the increase of activities has aggravated her back. Patient describes that currently her pain is located along her mid thoracic spine and radiates to her lumbar spine. Additionally, Pt reports that pain in her lumbar spine can radiates down the L posterior thigh and into the bottom of her foot. She describes the pain as sharp along the L leg and reports numbness and tingling in both feet. However she does have baseline neuropathy in her feet. She has occasional muscle spasms in both calves with increased activity throughout the day. She reports that she cannot take NSAIDS due to gastric conditions.  She denies changes to b/b, saddle anasthesia, abdominal pain, chills/fever, night sweats, nausea, vomiting,   Dominant hand: right  Imaging: Yes  CLINICAL DATA:  Chronic low back pain EXAM: LUMBAR SPINE - COMPLETE 4+ VIEW COMPARISON:  CT abdomen and pelvis 06/14/2017= FINDINGS: Normal anatomic alignment. No evidence for acute fracture or dislocation. Preservation of the vertebral body heights. Mild L5-S1 degenerative disc disease. Lower lumbar spine facet degenerative changes. Stool throughout the colon. Cholecystectomy clips. SI joints unremarkable. IMPRESSION: Lower lumbar spine degenerative disc and facet disease.  Electronically Signed   By: Jone Neither M.D.   On: 02/07/2024 23:18   PAIN:    Pain Intensity: Present: 6/10, Best: 3-4/10, Worst: 7-8/10 Pain location: Thoracic to Lumbar  Pain Quality: constant  Radiating: Yes  Focal Weakness: Yes Aggravating factors: Lifting, Bending, Kneeling Position Relieving factors: Walking and rest, lidocaine  patches 24-hour pain behavior: Activity Dependent, worse in the evening How long can  you sit: 15-20 How long can you stand: History of prior back injury, pain, surgery, or therapy: Yes  PRECAUTIONS: Fall  WEIGHT BEARING RESTRICTIONS: No  FALLS: Has patient fallen in last 6 months? No  Living Environment Lives with: lives with their family Lives in: House/apartment Stairs: No Has following equipment at home: None  Prior level of function: Independent  Occupational demands: Unemployed   Hobbies: Garden, Taking Care of Teresita children, walking the dog   Patient Goals: "To have no pain in my feet and back"    OBJECTIVE:  Patient Surveys  Modified Oswestry 18 / 50 = 36.0 %    Cognition Patient is oriented to person, place, and time.  Recent memory is intact.  Remote memory is intact.  Attention span and concentration are intact.  Expressive speech is intact.  Patient's fund of knowledge is within normal limits for educational level.    Gross Musculoskeletal Assessment Tremor: None Bulk: Normal Tone: Normal  GAIT: Distance walked: 10 Assistive device utilized: None Level of assistance: Complete Independence Comments: Reciprocal WNL  Posture (Seated): Increased Thoracic Kyphosis Lumbar lordosis: WNL  AROM AROM (Normal range in degrees) AROM   Lumbar   Flexion (65) 100%  Extension (30) 100%  Right lateral flexion (25) 80% *   Left lateral flexion (25) 80%*  Right rotation (30) 100%  Left rotation (30) 100%      Hip Right Left  Flexion (125) WNL  WNL  Extension (15)    Abduction (40)    Adduction     Internal Rotation (45) 32 36  External Rotation (45)        Knee    Flexion (135)    Extension (0)        Ankle    Dorsiflexion (20)    Plantarflexion (50)    Inversion (35)    Eversion (15)    (* = pain; Blank rows = not tested)  LE MMT: MMT (out of 5) Right  Left   Hip flexion 4- 4-  Hip extension    Hip abduction 4- 4-  Hip adduction    Hip internal rotation    Hip external rotation    Knee flexion 4 4  Knee extension  4 4  Ankle dorsiflexion 5 5  Ankle plantarflexion 5 5  Ankle inversion    Ankle eversion    (* = pain; Blank rows = not tested)  Sensation Grossly intact to light touch throughout bilateral LEs as determined by testing dermatomes L2-S2. Proprioception, stereognosis, and hot/cold testing deferred on this date.  Reflexes Deferred   Muscle Length Hamstrings: R: Negative L: Negative  Palpation Location Right Left         Lumbar paraspinals 0 0  Quadratus Lumborum    Gluteus Maximus 0 0  Gluteus Medius 1 1  Deep hip external rotators 2 2  PSIS    Fortin's Area (SIJ)    Greater Trochanter    Thoracic Paraspinals (T7-T12)  1 1  Rhomboids  1  1  (Blank rows = not tested) Graded on 0-4 scale (0 = no pain, 1 = pain, 2 = pain with wincing/grimacing/flinching, 3 = pain with withdrawal, 4 = unwilling to allow palpation)  Passive Accessory Intervertebral Motion Pt with reproduction of back pain with CPA L1-L5 and bilaterally T6-T12. Generally, hypomobile throughout  Special Tests Lumbar Radiculopathy and Discogenic: Slump (SN 83, -LR 0.32): R: Deferred   L: Negative   SLR (SN 92, -LR 0.29): R: Deferred L:  Deferred  Crossed SLR (SP 90): R: Deferred   L: Deferred   Facet Joint: Extension-Rotation (SN 100, -LR 0.0): R: Negative L: Negative  Hip: FABER (SN 81): R: Negative L: Negative FADIR (SN 94): R: Negative L: Negative Hip scour (SN 50): R:  L:    Functional Tasks Deep squat: Narrow BoS, limited Ankle DF  Forward Step-Down Test: R: Hip IR/ADD L:  Lateral Step-Down Test: R: Hip IR/ADD L:   30s Sit to Stand: 16 reps  TODAY'S TREATMENT: DATE: 05/05/2024  Subjective: Patient reports lower back pain (2-3/10 NPS) and 3-4/10 NPS in the middle back. Patient reports that her pain has improved throughout her mid/low back but still consistent.  No questions or concerns.   Therapeutic Exercise:  Supine Bridges   2 x 10 with hip abduction against PT resistance (at knees)    Supine Thoracic Open Close Book   8x ea dir   Supine Lumbar Rotation  6x ea dir    Supine Dead Bug with Resistance (Green TB)    3 x 10   Supine Flutter Kicks  3 x 10s   1 x 15s     OMEGA Cable Machine  Seated Lat Pull Down for latissimus stretch and strengthening   1 x 10 20#    2 x 10 25#  Multimodal cue for proper technique and tempo    Seated Scapular Row    2 x 10 15#    1 x 10 20#   Double Knee Stretch    30s/bout x 2 in order to improve ROM and tissue extensibility  Cat Cow   1 x 10 for lumbar mobility/ROM    Kettle bell Deadlift   2 x 10 20#KB   [place] Thoracic Extension against Foam Roller to increase thoracic mobility    2 x 10    PATIENT EDUCATION:  Education details: HEP, Exercise Technique  Person educated: Patient Education method: Explanation, Demonstration, and Handouts Education comprehension: verbalized understanding and returned demonstration   HOME EXERCISE PROGRAM:   Access Code: [email or web address] URL: [email or web address] Date: 04/08/2024 Prepared by: Veryl Gottron Codi Folkerts  Exercises - Supine Figure 4 Piriformis Stretch  - 2 x daily - 7 x weekly - 3 sets - 30 hold - Child's Pose Stretch  - 2 x daily - 7 x weekly - 3 sets - 30 hold - Seated Hamstring Stretch  - 2 x daily - 7 x weekly - 3 sets - 30-60s hold - Supine Lower Trunk Rotation  - 1 x daily - 7 x weekly - 2-3 sets - 10-2 reps - Sidelying Open Book Thoracic Lumbar Rotation and Extension  - 1 x daily - 7 x weekly - 2-3 sets - 10-12 reps - Quadruped Thoracic Rotation - Reach Under  - 1 x daily - 7 x weekly - 3 sets - 10-12 reps - Supine Bridge  - 1 x daily - 7 x weekly - 3 sets - 10 reps - Squat with Resistance at Thighs  - 1 x daily - 3-4 x weekly - 2-3 sets - 10-12 reps  Access Code: [email or web address] URL: [email or web address] Date: 03/31/2024 Prepared by: Satira Curet  Exercises - Supine Bridge  - 1 x daily - 7 x weekly - 3 sets - 10 reps - Supine Lower  Trunk Rotation  - 1 x daily - 7 x weekly - 3 sets - 10 reps - Supine Figure 4 Piriformis Stretch  - 1 x daily - 7 x weekly - 3 sets - 10 reps - Sidelying Open Book Thoracic Lumbar Rotation and Extension  - 1 x daily - 7 x weekly - 3 sets - 10 reps - Seated Hamstring Stretch  - 1 x daily - 7 x weekly - 3 sets - 10 reps  ASSESSMENT:  CLINICAL IMPRESSION: Continued PT POC focused on improving thoracolumbar pain and functional strengthening. Thoracic extensions against foam roller introduced in order to improve increased kyphosis in thoracic spine. Patient with poor demo to maintain proper spinal alignment with kettlebell deadlift; required multimodal cues in order to perform proper hip hinge and maintain neutral lumbar spine. PT plans to continue working on proper lifting mechanics in order to optimize lifting without pain. Patient still presents with LE weakness, decreased activity tolerance and pain in her thoracolumbar spine. Pt endorsed improvements in pain at end of session. Based on today's performance pt will continue to benefit from skilled physical therapy in order to maximize return to PLOF and improve QoL.   OBJECTIVE IMPAIRMENTS: decreased activity tolerance, decreased strength, hypomobility, impaired flexibility, and pain.   ACTIVITY LIMITATIONS: carrying, lifting, bending, squatting, sleeping, transfers, and caring for others  PARTICIPATION LIMITATIONS: yard work  PERSONAL FACTORS: Age, Behavior pattern, Past/current experiences, and Time since onset of injury/illness/exacerbation are also affecting patient's functional outcome.   REHAB POTENTIAL: Good  CLINICAL DECISION MAKING: Evolving/moderate  complexity  EVALUATION COMPLEXITY: Moderate   GOALS: Goals reviewed with patient? No  SHORT TERM GOALS: Target date: 06/16/2024  Pt will be independent with HEP in order to improve strength and decrease back pain to improve pain-free function at home and work. Baseline: 03/31/2024:  Initial Provided  Goal status: INITIAL   LONG TERM GOALS: Target date: 07/28/2024  Pt will demonstrate ability to navigate 15 steps on stairs pain free to demonstrate significant improvement in functional movements and pain.  Baseline: Navigated 16 steps (6") without rail support pain free Goal status: Goal MET  2.  Pt will decrease worst back pain by at least 2 points on the NPRS in order to demonstrate clinically significant reduction in back pain. Baseline: 03/31/2024: 8/10 NPRS Goal status: INITIAL  3.  Pt will decrease mODI score by at least 13 points in order demonstrate clinically significant reduction in back pain/disability.       Baseline: 03/31/2024:  18 / 50 = 36.0 % (50/50 Max Disability)  Goal status: INITIAL  4.  Pt will increase in R/L internal ROM in order to demonstrate increased tissue extensibility and decrease in pain from ext Baseline: 03/31/2024 Internal Rotation (45) 32 36   Goal status: INITIAL  5.  Pt will increase 30s STS by 5 reps in order to demonstrate improvements in LE strength and endurance Baseline: 03/31/2024: 16 reps Goal status: INITIAL   PLAN: PT FREQUENCY: 1-2x/week  PT DURATION: 12 weeks  PLANNED INTERVENTIONS: Therapeutic exercises, Therapeutic activity, Neuromuscular re-education, Balance training, Gait training, Patient/Family education, Self Care, Joint mobilization, Joint manipulation, Vestibular training, Canalith repositioning, Orthotic/Fit training, DME instructions, Dry Needling, Electrical stimulation, Spinal manipulation, Spinal mobilization, Cryotherapy, Moist heat, Taping, Traction, Ultrasound, Ionotophoresis 4mg /ml Dexamethasone , Manual therapy, and Re-evaluation.  PLAN FOR NEXT SESSION:  Progress gluteal and core strengthening, Progress thoracolumbar mobility, progress functional strengthening. Lifting mechanics.    Satira Curet PT, DPT Physical Therapist- [facility]  05/05/2024, 11:21 AM

## 2024-05-06 DIAGNOSIS — J301 Allergic rhinitis due to pollen: Secondary | ICD-10-CM | POA: Diagnosis not present

## 2024-05-07 ENCOUNTER — Ambulatory Visit

## 2024-05-07 DIAGNOSIS — J3081 Allergic rhinitis due to animal (cat) (dog) hair and dander: Secondary | ICD-10-CM | POA: Diagnosis not present

## 2024-05-07 DIAGNOSIS — M5459 Other low back pain: Secondary | ICD-10-CM | POA: Diagnosis not present

## 2024-05-07 DIAGNOSIS — J3089 Other allergic rhinitis: Secondary | ICD-10-CM | POA: Diagnosis not present

## 2024-05-07 DIAGNOSIS — M6281 Muscle weakness (generalized): Secondary | ICD-10-CM | POA: Diagnosis not present

## 2024-05-07 DIAGNOSIS — M546 Pain in thoracic spine: Secondary | ICD-10-CM | POA: Diagnosis not present

## 2024-05-07 NOTE — Therapy (Signed)
 OUTPATIENT PHYSICAL THERAPY THORACOLUMBAR TREATMENT   Patient Name: Dana Hensley MRN: [id number] DOB:1961-03-26, 63 y.o., female Today's Date: 05/07/2024  END OF SESSION:  PT End of Session - 05/07/24 1117     Visit Number 7    Number of Visits 25    Date for PT Re-Evaluation 06/23/24    Authorization Type [employer] auth#[id number]    Authorization Time Period 5/13-7/12    Authorization - Number of Visits 10    Progress Note Due on Visit 10    PT Start Time 1116    PT Stop Time 1155    PT Time Calculation (min) 39 min    Activity Tolerance Patient tolerated treatment well    Behavior During Therapy WFL for tasks assessed/performed              Past Medical History:  Diagnosis Date   Allergy    Anxiety    Asthma    B12 deficiency    Barrett esophagus    Chronic atrophic gastritis    DVT, lower extremity (HCC)    Gastric polyps    HLD (hyperlipidemia)    Hypothyroidism    IGT (impaired glucose tolerance)    Neuroendocrine tumor    carcinoid tumors of the stomach, s/p extraction surgery at [facility]   PONV (postoperative nausea and vomiting)    Vitamin D  deficiency    Past Surgical History:  Procedure Laterality Date   APPENDECTOMY     CHOLECYSTECTOMY N/A 11/16/2016   Procedure: LAPAROSCOPIC CHOLECYSTECTOMY WITH INTRAOPERATIVE CHOLANGIOGRAM;  Surgeon: Benancio Bracket, MD;  Location: [facility] ORS;  Service: General;  Laterality: N/A;   COLONOSCOPY WITH PROPOFOL  N/A 04/21/2021   Procedure: COLONOSCOPY WITH PROPOFOL ;  Surgeon: Marnee Sink, MD;  Location: [facility] END[facility];  Service: Endoscopy;  Laterality: N/A;   DIAGNOSTIC LAPAROSCOPY     ESOPHAGOGASTRODUODENOSCOPY     TONSILLECTOMY     TOTAL ABDOMINAL HYSTERECTOMY W/ BILATERAL SALPINGOOPHORECTOMY  2012   due to ovar cysts/pain   VEIN LIGATION AND STRIPPING     Patient Active Problem List   Diagnosis Date Noted   Leg pain 02/04/2024   Other chest pain 08/02/2021   Chest pain 08/02/2021   Hx of colonic polyps     Hyperlipidemia, unspecified 08/29/2018   IGT (impaired glucose tolerance) 08/29/2018   Intrinsic atopic dermatitis 08/29/2018   History of adenomatous polyp of colon 04/01/2018   Elevated liver function tests 01/18/2018   Weight loss 01/15/2017   Pain in limb 12/18/2016   Calculus of gallbladder without cholecystitis without obstruction 10/23/2016   Chronic venous insufficiency 10/16/2016   Varicose veins of both lower extremities with pain 10/16/2016   Lymphedema 10/16/2016   Epigastric pain 10/03/2016   Barrett's esophagus determined by endoscopy 01/25/2016   Vitamin B12 deficiency 10/18/2015   Chronic neck and back pain 08/19/2015   Allergic rhinitis with postnasal drip 07/09/2015   Dyslipidemia 05/05/2015   Acquired hypothyroidism 05/05/2015   Carcinoid tumor 05/05/2015   History of malignant carcinoid tumor of stomach 03/26/2012    PCP: Raina Bunting, DO  REFERRING PROVIDER: Ludwig Safer, PA-C  REFERRING DIAG: R20.0,R20.2 (ICD-10-CM) - Numbness and tingling of both legs M54.2 (ICD-10-CM) - Neck pain G89.29,M54.42 (ICD-10-CM) - Chronic bilateral low back pain with left-sided sciatica  RATIONALE FOR EVALUATION AND TREATMENT: Rehabilitation  THERAPY DIAG: Other low back pain  Pain in thoracic spine  Muscle weakness (generalized)  ONSET DATE: Chronic (~20 years)   FOLLOW-UP APPT SCHEDULED WITH REFERRING PROVIDER: No    SUBJECTIVE:  SUBJECTIVE STATEMENT:  Chief Concern of Neck Pain and low back pain   PERTINENT HISTORY: Patient reports to OPPT with a chief complaint of neck and back pain.  Patient has been experiencing chronic LBP for ~20 yrs but symptoms have worsened in the last year. She reports termination from her job in 2023 and within the last year she has been  helping her son with care taking of her 2 grandchildren. Her son lives in a multistory home and she constantly has to perform stair navigation (15 stairs).  She reports increase in activities within the last year with care taking for her family and believes that the increase of activities has aggravated her back. Patient describes that currently her pain is located along her mid thoracic spine and radiates to her lumbar spine. Additionally, Pt reports that pain in her lumbar spine can radiates down the L posterior thigh and into the bottom of her foot. She describes the pain as sharp along the L leg and reports numbness and tingling in both feet. However she does have baseline neuropathy in her feet. She has occasional muscle spasms in both calves with increased activity throughout the day. She reports that she cannot take NSAIDS due to gastric conditions.  She denies changes to b/b, saddle anasthesia, abdominal pain, chills/fever, night sweats, nausea, vomiting,   Dominant hand: right  Imaging: Yes  CLINICAL DATA:  Chronic low back pain EXAM: LUMBAR SPINE - COMPLETE 4+ VIEW COMPARISON:  CT abdomen and pelvis 06/14/2017= FINDINGS: Normal anatomic alignment. No evidence for acute fracture or dislocation. Preservation of the vertebral body heights. Mild L5-S1 degenerative disc disease. Lower lumbar spine facet degenerative changes. Stool throughout the colon. Cholecystectomy clips. SI joints unremarkable. IMPRESSION: Lower lumbar spine degenerative disc and facet disease.  Electronically Signed   By: Jone Neither M.D.   On: 02/07/2024 23:18   PAIN:    Pain Intensity: Present: 6/10, Best: 3-4/10, Worst: 7-8/10 Pain location: Thoracic to Lumbar  Pain Quality: constant  Radiating: Yes  Focal Weakness: Yes Aggravating factors: Lifting, Bending, Kneeling Position Relieving factors: Walking and rest, lidocaine  patches 24-hour pain behavior: Activity Dependent, worse in the evening How long can  you sit: 15-20 How long can you stand: History of prior back injury, pain, surgery, or therapy: Yes  PRECAUTIONS: Fall  WEIGHT BEARING RESTRICTIONS: No  FALLS: Has patient fallen in last 6 months? No  Living Environment Lives with: lives with their family Lives in: House/apartment Stairs: No Has following equipment at home: None  Prior level of function: Independent  Occupational demands: Unemployed   Hobbies: Garden, Taking Care of [place] children, walking the dog   Patient Goals: To have no pain in my feet and back    OBJECTIVE:  Patient Surveys  Modified Oswestry 18 / 50 = 36.0 %    Cognition Patient is oriented to person, place, and time.  Recent memory is intact.  Remote memory is intact.  Attention span and concentration are intact.  Expressive speech is intact.  Patient's fund of knowledge is within normal limits for educational level.    Gross Musculoskeletal Assessment Tremor: None Bulk: Normal Tone: Normal  GAIT: Distance walked: 10 Assistive device utilized: None Level of assistance: Complete Independence Comments: Reciprocal WNL  Posture (Seated): Increased Thoracic Kyphosis Lumbar lordosis: WNL  AROM AROM (Normal range in degrees) AROM   Lumbar   Flexion (65) 100%  Extension (30) 100%  Right lateral flexion (25) 80% *   Left lateral flexion (25) 80%*  Right rotation (30) 100%  Left rotation (30) 100%      Hip Right Left  Flexion (125) WNL  WNL  Extension (15)    Abduction (40)    Adduction     Internal Rotation (45) 32 36  External Rotation (45)        Knee    Flexion (135)    Extension (0)        Ankle    Dorsiflexion (20)    Plantarflexion (50)    Inversion (35)    Eversion (15)    (* = pain; Blank rows = not tested)  LE MMT: MMT (out of 5) Right  Left   Hip flexion 4- 4-  Hip extension    Hip abduction 4- 4-  Hip adduction    Hip internal rotation    Hip external rotation    Knee flexion 4 4  Knee extension  4 4  Ankle dorsiflexion 5 5  Ankle plantarflexion 5 5  Ankle inversion    Ankle eversion    (* = pain; Blank rows = not tested)  Sensation Grossly intact to light touch throughout bilateral LEs as determined by testing dermatomes L2-S2. Proprioception, stereognosis, and hot/cold testing deferred on this date.  Reflexes Deferred   Muscle Length Hamstrings: R: Negative L: Negative  Palpation Location Right Left         Lumbar paraspinals 0 0  Quadratus Lumborum    Gluteus Maximus 0 0  Gluteus Medius 1 1  Deep hip external rotators 2 2  PSIS    Fortin's Area (SIJ)    Greater Trochanter    Thoracic Paraspinals (T7-T12)  1 1  Rhomboids  1  1  (Blank rows = not tested) Graded on 0-4 scale (0 = no pain, 1 = pain, 2 = pain with wincing/grimacing/flinching, 3 = pain with withdrawal, 4 = unwilling to allow palpation)  Passive Accessory Intervertebral Motion Pt with reproduction of back pain with CPA L1-L5 and bilaterally T6-T12. Generally, hypomobile throughout  Special Tests Lumbar Radiculopathy and Discogenic: Slump (SN 83, -LR 0.32): R: Deferred   L: Negative   SLR (SN 92, -LR 0.29): R: Deferred L:  Deferred  Crossed SLR (SP 90): R: Deferred   L: Deferred   Facet Joint: Extension-Rotation (SN 100, -LR 0.0): R: Negative L: Negative  Hip: FABER (SN 81): R: Negative L: Negative FADIR (SN 94): R: Negative L: Negative Hip scour (SN 50): R:  L:    Functional Tasks Deep squat: Narrow BoS, limited Ankle DF  Forward Step-Down Test: R: Hip IR/ADD L:  Lateral Step-Down Test: R: Hip IR/ADD L:   30s Sit to Stand: 16 reps  TODAY'S TREATMENT: DATE: 05/07/2024  Subjective: Patient reports middle back pain at 2-3/10 and reports 4-5/10 in the lumbar spine. Patient reports some improvements in mid back following massage therapy this past weekend. No questions or concerns.   Therapeutic Exercise:   Quadruped Thoracic Rotation - Reach Under   R/L: 2 x 10 with Foam Roller      Quadruped Thoracic Rotation - Upward Reach    R/L: 1 x 10    Dead bug for core stabilizationaf   3 x 10 (Alternating UE/LE)    VC for slowed tempo    OMEGA Cable    Lat Pull Down     1 x 10 25#    2 x 10 25#    Shoulder Row     2 x 10 25#  1 x 10 20#  Therapeutic Activity:   Supine Single Leg Bridge for core stabilization and hamstring strength    R/L: 2 x 10      Dumbbell RDL (BUE)    2 x 10, 10#    Forward Step Down 6 (SUE Support)   R/L: 2 x 10     Kettlebell Squat    1 x 10 from 8 stool    1 x 10 from floor   PATIENT EDUCATION:  Education details: HEP, Exercise Technique  Person educated: Patient Education method: Explanation, Demonstration, and Handouts Education comprehension: verbalized understanding and returned demonstration   HOME EXERCISE PROGRAM:   Access Code: [email or web address] URL: [email or web address] Date: 05/07/2024 Prepared by: Veryl Gottron Chasady Longwell  Exercises - Supine Figure 4 Piriformis Stretch  - 2 x daily - 7 x weekly - 3 sets - 30 hold - Child's Pose Stretch  - 2 x daily - 7 x weekly - 3 sets - 30 hold - Seated Hamstring Stretch  - 2 x daily - 7 x weekly - 3 sets - 30-60s hold - Supine Lower Trunk Rotation  - 1 x daily - 7 x weekly - 2-3 sets - 10-2 reps - Sidelying Open Book Thoracic Lumbar Rotation and Extension  - 1 x daily - 7 x weekly - 2-3 sets - 10-12 reps - Quadruped Thoracic Rotation - Reach Under  - 1 x daily - 7 x weekly - 3 sets - 10-12 reps - Supine Bridge  - 1 x daily - 7 x weekly - 3 sets - 10 reps - Squat with Resistance at Thighs  - 1 x daily - 3-4 x weekly - 2-3 sets - 10-12 reps - Forward Step Up  - 1 x daily - 3-4 x weekly - 2 sets - 10 reps - Forward Step Down Touch with Heel  - 1 x daily - 3-4 x weekly - 2 sets - 10 reps  Access Code: [email or web address] URL: [email or web address] Date: 04/08/2024 Prepared by: Veryl Gottron Seydina Holliman  Exercises - Supine Figure 4 Piriformis Stretch  - 2 x daily - 7 x weekly  - 3 sets - 30 hold - Child's Pose Stretch  - 2 x daily - 7 x weekly - 3 sets - 30 hold - Seated Hamstring Stretch  - 2 x daily - 7 x weekly - 3 sets - 30-60s hold - Supine Lower Trunk Rotation  - 1 x daily - 7 x weekly - 2-3 sets - 10-2 reps - Sidelying Open Book Thoracic Lumbar Rotation and Extension  - 1 x daily - 7 x weekly - 2-3 sets - 10-12 reps - Quadruped Thoracic Rotation - Reach Under  - 1 x daily - 7 x weekly - 3 sets - 10-12 reps - Supine Bridge  - 1 x daily - 7 x weekly - 3 sets - 10 reps - Squat with Resistance at Thighs  - 1 x daily - 3-4 x weekly - 2-3 sets - 10-12 reps   ASSESSMENT:  CLINICAL IMPRESSION: Continued PT POC focused on improving thoracolumbar pain and functional strengthening. Patient with improved pain and mobility following quadraped throacic rotation interventions. Good demo of dumbbell deadlift with neutral spine following tactile cues for increased knee flexion and less lumbar flexion. Patient tolerated increase with reps performing kettlebell squats and she endorsed improvements in LE since initial visit. Patient is still experiencing decreased activity tolerance, pain and stiffness along mid to lower back limiting her full participation with household ADLs and recreational activities. Based  on today's performance pt will continue to benefit from skilled physical therapy in order to maximize return to PLOF and improve QoL.   OBJECTIVE IMPAIRMENTS: decreased activity tolerance, decreased strength, hypomobility, impaired flexibility, and pain.   ACTIVITY LIMITATIONS: carrying, lifting, bending, squatting, sleeping, transfers, and caring for others  PARTICIPATION LIMITATIONS: yard work  PERSONAL FACTORS: Age, Behavior pattern, Past/current experiences, and Time since onset of injury/illness/exacerbation are also affecting patient's functional outcome.   REHAB POTENTIAL: Good  CLINICAL DECISION MAKING: Evolving/moderate complexity  EVALUATION COMPLEXITY:  Moderate   GOALS: Goals reviewed with patient? No  SHORT TERM GOALS: Target date: 06/18/2024  Pt will be independent with HEP in order to improve strength and decrease back pain to improve pain-free function at home and work. Baseline: 03/31/2024: Initial Provided  Goal status: INITIAL   LONG TERM GOALS: Target date: 07/30/2024  Pt will demonstrate ability to navigate 15 steps on stairs pain free to demonstrate significant improvement in functional movements and pain.  Baseline: Navigated 16 steps (6) without rail support pain free Goal status: Goal MET  2.  Pt will decrease worst back pain by at least 2 points on the NPRS in order to demonstrate clinically significant reduction in back pain. Baseline: 03/31/2024: 8/10 NPRS Goal status: INITIAL  3.  Pt will decrease mODI score by at least 13 points in order demonstrate clinically significant reduction in back pain/disability.       Baseline: 03/31/2024:  18 / 50 = 36.0 % (50/50 Max Disability)  Goal status: INITIAL  4.  Pt will increase in R/L internal ROM in order to demonstrate increased tissue extensibility and decrease in pain from ext Baseline: 03/31/2024 Internal Rotation (45) 32 36   Goal status: INITIAL  5.  Pt will increase 30s STS by 5 reps in order to demonstrate improvements in LE strength and endurance Baseline: 03/31/2024: 16 reps Goal status: INITIAL   PLAN: PT FREQUENCY: 1-2x/week  PT DURATION: 12 weeks  PLANNED INTERVENTIONS: Therapeutic exercises, Therapeutic activity, Neuromuscular re-education, Balance training, Gait training, Patient/Family education, Self Care, Joint mobilization, Joint manipulation, Vestibular training, Canalith repositioning, Orthotic/Fit training, DME instructions, Dry Needling, Electrical stimulation, Spinal manipulation, Spinal mobilization, Cryotherapy, Moist heat, Taping, Traction, Ultrasound, Ionotophoresis 4mg /ml Dexamethasone , Manual therapy, and Re-evaluation.  PLAN FOR NEXT  SESSION:  Progress gluteal and core strengthening, Progress thoracolumbar mobility, progress functional strengthening. Lifting mechanics.    Satira Curet PT, DPT Physical Therapist- [facility]  05/07/2024, 11:18 AM

## 2024-05-08 ENCOUNTER — Encounter: Admitting: Neurology

## 2024-05-08 DIAGNOSIS — Z419 Encounter for procedure for purposes other than remedying health state, unspecified: Secondary | ICD-10-CM | POA: Diagnosis not present

## 2024-05-12 ENCOUNTER — Ambulatory Visit

## 2024-05-12 DIAGNOSIS — M6281 Muscle weakness (generalized): Secondary | ICD-10-CM | POA: Diagnosis not present

## 2024-05-12 DIAGNOSIS — M5459 Other low back pain: Secondary | ICD-10-CM

## 2024-05-12 DIAGNOSIS — M546 Pain in thoracic spine: Secondary | ICD-10-CM

## 2024-05-12 NOTE — Therapy (Signed)
 OUTPATIENT PHYSICAL THERAPY THORACOLUMBAR TREATMENT   Patient Name: Dana Hensley MRN: [id number] DOB:07/25/1961, 63 y.o., female Today's Date: 05/12/2024  END OF SESSION:  PT End of Session - 05/12/24 0812     Visit Number 8    Number of Visits 25    Date for PT Re-Evaluation 06/23/24    Authorization Type [employer] auth#[id number]    Authorization Time Period 5/13-7/12    Authorization - Visit Number 7    Authorization - Number of Visits 10    Progress Note Due on Visit 10    PT Start Time 0815    PT Stop Time 0855    PT Time Calculation (min) 40 min    Activity Tolerance Patient tolerated treatment well    Behavior During Therapy WFL for tasks assessed/performed           Past Medical History:  Diagnosis Date   Allergy    Anxiety    Asthma    B12 deficiency    Barrett esophagus    Chronic atrophic gastritis    DVT, lower extremity (HCC)    Gastric polyps    HLD (hyperlipidemia)    Hypothyroidism    IGT (impaired glucose tolerance)    Neuroendocrine tumor    carcinoid tumors of the stomach, s/p extraction surgery at [facility]   PONV (postoperative nausea and vomiting)    Vitamin D  deficiency    Past Surgical History:  Procedure Laterality Date   APPENDECTOMY     CHOLECYSTECTOMY N/A 11/16/2016   Procedure: LAPAROSCOPIC CHOLECYSTECTOMY WITH INTRAOPERATIVE CHOLANGIOGRAM;  Surgeon: Benancio Bracket, MD;  Location: [facility] ORS;  Service: General;  Laterality: N/A;   COLONOSCOPY WITH PROPOFOL  N/A 04/21/2021   Procedure: COLONOSCOPY WITH PROPOFOL ;  Surgeon: Marnee Sink, MD;  Location: [facility] ENDOSCOPY;  Service: Endoscopy;  Laterality: N/A;   DIAGNOSTIC LAPAROSCOPY     ESOPHAGOGASTRODUODENOSCOPY     TONSILLECTOMY     TOTAL ABDOMINAL HYSTERECTOMY W/ BILATERAL SALPINGOOPHORECTOMY  2012   due to ovar cysts/pain   VEIN LIGATION AND STRIPPING     Patient Active Problem List   Diagnosis Date Noted   Leg pain 02/04/2024   Other chest pain 08/02/2021   Chest pain  08/02/2021   Hx of colonic polyps    Hyperlipidemia, unspecified 08/29/2018   IGT (impaired glucose tolerance) 08/29/2018   Intrinsic atopic dermatitis 08/29/2018   History of adenomatous polyp of colon 04/01/2018   Elevated liver function tests 01/18/2018   Weight loss 01/15/2017   Pain in limb 12/18/2016   Calculus of gallbladder without cholecystitis without obstruction 10/23/2016   Chronic venous insufficiency 10/16/2016   Varicose veins of both lower extremities with pain 10/16/2016   Lymphedema 10/16/2016   Epigastric pain 10/03/2016   Barrett's esophagus determined by endoscopy 01/25/2016   Vitamin B12 deficiency 10/18/2015   Chronic neck and back pain 08/19/2015   Allergic rhinitis with postnasal drip 07/09/2015   Dyslipidemia 05/05/2015   Acquired hypothyroidism 05/05/2015   Carcinoid tumor 05/05/2015   History of malignant carcinoid tumor of stomach 03/26/2012    PCP: Raina Bunting, DO  REFERRING PROVIDER: Ludwig Safer, PA-C  REFERRING DIAG: R20.0,R20.2 (ICD-10-CM) - Numbness and tingling of both legs M54.2 (ICD-10-CM) - Neck pain G89.29,M54.42 (ICD-10-CM) - Chronic bilateral low back pain with left-sided sciatica  RATIONALE FOR EVALUATION AND TREATMENT: Rehabilitation  THERAPY DIAG: Other low back pain  Pain in thoracic spine  Muscle weakness (generalized)  ONSET DATE: Chronic (~20 years)   FOLLOW-UP APPT SCHEDULED WITH REFERRING PROVIDER:  No    SUBJECTIVE:                                                                                                                                                                                         SUBJECTIVE STATEMENT:  Chief Concern of Neck Pain and low back pain   PERTINENT HISTORY: Patient reports to OPPT with a chief complaint of neck and back pain.  Patient has been experiencing chronic LBP for ~20 yrs but symptoms have worsened in the last year. She reports termination from her job in 2023 and  within the last year she has been helping her son with care taking of her 2 grandchildren. Her son lives in a multistory home and she constantly has to perform stair navigation (15 stairs).  She reports increase in activities within the last year with care taking for her family and believes that the increase of activities has aggravated her back. Patient describes that currently her pain is located along her mid thoracic spine and radiates to her lumbar spine. Additionally, Pt reports that pain in her lumbar spine can radiates down the L posterior thigh and into the bottom of her foot. She describes the pain as sharp along the L leg and reports numbness and tingling in both feet. However she does have baseline neuropathy in her feet. She has occasional muscle spasms in both calves with increased activity throughout the day. She reports that she cannot take NSAIDS due to gastric conditions.  She denies changes to b/b, saddle anasthesia, abdominal pain, chills/fever, night sweats, nausea, vomiting,   Dominant hand: right  Imaging: Yes  CLINICAL DATA:  Chronic low back pain EXAM: LUMBAR SPINE - COMPLETE 4+ VIEW COMPARISON:  CT abdomen and pelvis 06/14/2017= FINDINGS: Normal anatomic alignment. No evidence for acute fracture or dislocation. Preservation of the vertebral body heights. Mild L5-S1 degenerative disc disease. Lower lumbar spine facet degenerative changes. Stool throughout the colon. Cholecystectomy clips. SI joints unremarkable. IMPRESSION: Lower lumbar spine degenerative disc and facet disease.  Electronically Signed   By: Jone Neither M.D.   On: 02/07/2024 23:18   PAIN:    Pain Intensity: Present: 6/10, Best: 3-4/10, Worst: 7-8/10 Pain location: Thoracic to Lumbar  Pain Quality: constant  Radiating: Yes  Focal Weakness: Yes Aggravating factors: Lifting, Bending, Kneeling Position Relieving factors: Walking and rest, lidocaine  patches 24-hour pain behavior: Activity Dependent,  worse in the evening How long can you sit: 15-20 How long can you stand: History of prior back injury, pain, surgery, or therapy: Yes  PRECAUTIONS: Fall  WEIGHT BEARING RESTRICTIONS: No  FALLS: Has patient fallen in last 6 months? No  Living Environment Lives with: lives with their family Lives in: House/apartment Stairs: No Has following equipment at home: None  Prior level of function: Independent  Occupational demands: Unemployed   Hobbies: Garden, Taking Care of [place] children, walking the dog   Patient Goals: To have no pain in my feet and back    OBJECTIVE:  Patient Surveys  Modified Oswestry 18 / 50 = 36.0 %    Cognition Patient is oriented to person, place, and time.  Recent memory is intact.  Remote memory is intact.  Attention span and concentration are intact.  Expressive speech is intact.  Patient's fund of knowledge is within normal limits for educational level.    Gross Musculoskeletal Assessment Tremor: None Bulk: Normal Tone: Normal  GAIT: Distance walked: 10 Assistive device utilized: None Level of assistance: Complete Independence Comments: Reciprocal WNL  Posture (Seated): Increased Thoracic Kyphosis Lumbar lordosis: WNL  AROM AROM (Normal range in degrees) AROM   Lumbar   Flexion (65) 100%  Extension (30) 100%  Right lateral flexion (25) 80% *   Left lateral flexion (25) 80%*   Right rotation (30) 100%  Left rotation (30) 100%      Hip Right Left  Flexion (125) WNL  WNL  Extension (15)    Abduction (40)    Adduction     Internal Rotation (45) 32 36  External Rotation (45)        Knee    Flexion (135)    Extension (0)        Ankle    Dorsiflexion (20)    Plantarflexion (50)    Inversion (35)    Eversion (15)    (* = pain; Blank rows = not tested)  LE MMT: MMT (out of 5) Right  Left   Hip flexion 4- 4-  Hip extension    Hip abduction 4- 4-  Hip adduction    Hip internal rotation    Hip external rotation     Knee flexion 4 4  Knee extension 4 4  Ankle dorsiflexion 5 5  Ankle plantarflexion 5 5  Ankle inversion    Ankle eversion    (* = pain; Blank rows = not tested)  Sensation Grossly intact to light touch throughout bilateral LEs as determined by testing dermatomes L2-S2. Proprioception, stereognosis, and hot/cold testing deferred on this date.  Reflexes Deferred   Muscle Length Hamstrings: R: Negative L: Negative  Palpation Location Right Left         Lumbar paraspinals 0 0  Quadratus Lumborum    Gluteus Maximus 0 0  Gluteus Medius 1 1  Deep hip external rotators 2 2  PSIS    Fortin's Area (SIJ)    Greater Trochanter    Thoracic Paraspinals (T7-T12)  1 1  Rhomboids  1  1  (Blank rows = not tested) Graded on 0-4 scale (0 = no pain, 1 = pain, 2 = pain with wincing/grimacing/flinching, 3 = pain with withdrawal, 4 = unwilling to allow palpation)  Passive Accessory Intervertebral Motion Pt with reproduction of back pain with CPA L1-L5 and bilaterally T6-T12. Generally, hypomobile throughout  Special Tests Lumbar Radiculopathy and Discogenic: Slump (SN 83, -LR 0.32): R: Deferred   L: Negative   SLR (SN 92, -LR 0.29): R: Deferred L:  Deferred  Crossed SLR (SP 90): R: Deferred   L: Deferred   Facet Joint: Extension-Rotation (SN 100, -LR 0.0): R: Negative L: Negative  Hip: FABER (SN 81): R: Negative L: Negative FADIR (SN  94): R: Negative L: Negative Hip scour (SN 50): R:  L:    Functional Tasks Deep squat: Narrow BoS, limited Ankle DF  Forward Step-Down Test: R: Hip IR/ADD L:  Lateral Step-Down Test: R: Hip IR/ADD L:   30s Sit to Stand: 16 reps  TODAY'S TREATMENT: DATE: 05/12/2024  Subjective:Patient reports pain in the lower back 2-3/10 and middle 3-4/10 NPS. Patient reports improved pain following last PT session. Patient still experiencing moderate foot pain and has attempted Dr. Pecolia Bourbon orthotic but no improvements to heel pain. Patient ordered a foam roller to  assist with mobility in thoracolumbar spine. No questions or concerns.   Therapeutic Exercise:   Quadruped Thoracic Rotation - Reach Under   R/L: 2 x 10 with Foam Roller     Supine Lumbar Rotation for lumbar spine mobility and increase ROM    R/L: 1 x 8 ea dir    Supine Bridge:    1 x 10   1 x 10 with feet on foam roller    1 x 10 with feet on foam roller and Blue TB around knees     Side Plank with Hip abduction for core and glute stabilization:    3 x 10s   Bird Dog    2 x 10      Hip Matrix    Hip Abduction     2 x 10, 40#     Standing Hip Extension against resistance    R/L: 2 x 10, Red TB around ankle    Seated Thoracic Extension   1 x 10, PT OP applied at elbows    Therapeutic Activity:    Deep Squats with Blue TB below knee, med ball (2Kg) pressed out for core stabilization    2 x 10     Forward Step Down 6 (SUE support)    R/L: 1 x 10     1 x 10, No UE support, SBA  PATIENT EDUCATION:  Education details: HEP, Exercise Technique  Person educated: Patient Education method: Explanation, Demonstration, and Handouts Education comprehension: verbalized understanding and returned demonstration   HOME EXERCISE PROGRAM:   Access Code: [email or web address] URL: [email or web address] Date: 05/07/2024 Prepared by: Veryl Gottron Doyle Kunath  Exercises - Supine Figure 4 Piriformis Stretch  - 2 x daily - 7 x weekly - 3 sets - 30 hold - Child's Pose Stretch  - 2 x daily - 7 x weekly - 3 sets - 30 hold - Seated Hamstring Stretch  - 2 x daily - 7 x weekly - 3 sets - 30-60s hold - Supine Lower Trunk Rotation  - 1 x daily - 7 x weekly - 2-3 sets - 10-2 reps - Sidelying Open Book Thoracic Lumbar Rotation and Extension  - 1 x daily - 7 x weekly - 2-3 sets - 10-12 reps - Quadruped Thoracic Rotation - Reach Under  - 1 x daily - 7 x weekly - 3 sets - 10-12 reps - Supine Bridge  - 1 x daily - 7 x weekly - 3 sets - 10 reps - Squat with Resistance at Thighs  - 1 x daily - 3-4 x  weekly - 2-3 sets - 10-12 reps - Forward Step Up  - 1 x daily - 3-4 x weekly - 2 sets - 10 reps - Forward Step Down Touch with Heel  - 1 x daily - 3-4 x weekly - 2 sets - 10 reps  Access Code: [email or web address] URL: [email or web address] Date: 04/08/2024 Prepared by: Satira Curet  Exercises -  Supine Figure 4 Piriformis Stretch  - 2 x daily - 7 x weekly - 3 sets - 30 hold - Child's Pose Stretch  - 2 x daily - 7 x weekly - 3 sets - 30 hold - Seated Hamstring Stretch  - 2 x daily - 7 x weekly - 3 sets - 30-60s hold - Supine Lower Trunk Rotation  - 1 x daily - 7 x weekly - 2-3 sets - 10-2 reps - Sidelying Open Book Thoracic Lumbar Rotation and Extension  - 1 x daily - 7 x weekly - 2-3 sets - 10-12 reps - Quadruped Thoracic Rotation - Reach Under  - 1 x daily - 7 x weekly - 3 sets - 10-12 reps - Supine Bridge  - 1 x daily - 7 x weekly - 3 sets - 10 reps - Squat with Resistance at Thighs  - 1 x daily - 3-4 x weekly - 2-3 sets - 10-12 reps   ASSESSMENT:  CLINICAL IMPRESSION: Continued PT POC focused on improving thoracolumbar pain and functional strengthening. Patient tolerated all interventions without pain in lower back or mid back. PT focused on gluteal strengthening; multi modal cues provided to patient in order to reduce compensation with trunk leaning. Patient responding well to thoracolumbar mobility exercises in order to improve ROM and decreased stiffness. However patient is still experiencing decreased activity tolerance, pain and stiffness along mid to lower back limiting her full participation with household ADLs and recreational activities. Based on today's performance pt will continue to benefit from skilled physical therapy in order to maximize return to PLOF and improve QoL.   OBJECTIVE IMPAIRMENTS: decreased activity tolerance, decreased strength, hypomobility, impaired flexibility, and pain.   ACTIVITY LIMITATIONS: carrying, lifting, bending, squatting, sleeping,  transfers, and caring for others  PARTICIPATION LIMITATIONS: yard work  PERSONAL FACTORS: Age, Behavior pattern, Past/current experiences, and Time since onset of injury/illness/exacerbation are also affecting patient's functional outcome.   REHAB POTENTIAL: Good  CLINICAL DECISION MAKING: Evolving/moderate complexity  EVALUATION COMPLEXITY: Moderate   GOALS: Goals reviewed with patient? No  SHORT TERM GOALS: Target date: 06/23/2024  Pt will be independent with HEP in order to improve strength and decrease back pain to improve pain-free function at home and work. Baseline: 03/31/2024: Initial Provided; 05/12/24: Pt endorses 100% adherence to HEP.  Goal status: GOAL MET    LONG TERM GOALS: Target date: 08/04/2024  Pt will demonstrate ability to navigate 15 steps on stairs pain free to demonstrate significant improvement in functional movements and pain.  Baseline: Navigated 16 steps (6) without rail support pain free Goal status: Goal MET  2.  Pt will decrease worst back pain by at least 2 points on the NPRS in order to demonstrate clinically significant reduction in back pain. Baseline: 03/31/2024: 8/10 NPRS; 05/12/2024: 6/10 (middle) 4/10 low back  Goal status: Progressing    3.  Pt will decrease mODI score by at least 13 points in order demonstrate clinically significant reduction in back pain/disability.       Baseline: 03/31/2024:  18 / 50 = 36.0 % (50/50 Max Disability)  Goal status: INITIAL  4.  Pt will increase in R/L internal ROM in order to demonstrate increased tissue extensibility and decrease in pain. Baseline: 03/31/2024 Internal Rotation (45) 32 36   Goal status: INITIAL  5.  Pt will increase 30s STS by 5 reps in order to demonstrate improvements in LE strength and endurance Baseline: 03/31/2024: 16 reps Goal status: INITIAL   PLAN: PT FREQUENCY: 1-2x/week  PT DURATION: 12 weeks  PLANNED INTERVENTIONS: Therapeutic exercises, Therapeutic activity,  Neuromuscular re-education, Balance training, Gait training, Patient/Family education, Self Care, Joint mobilization, Joint manipulation, Vestibular training, Canalith repositioning, Orthotic/Fit training, DME instructions, Dry Needling, Electrical stimulation, Spinal manipulation, Spinal mobilization, Cryotherapy, Moist heat, Taping, Traction, Ultrasound, Ionotophoresis 4mg /ml Dexamethasone , Manual therapy, and Re-evaluation.  PLAN FOR NEXT SESSION:  Reassess 30s STS, ROM, mODI,Progress gluteal and core strengthening, Progress thoracolumbar mobility, progress functional strengthening. Lifting mechanics.    Satira Curet PT, DPT Physical Therapist- [facility]  05/12/2024, 12:52 PM

## 2024-05-15 ENCOUNTER — Ambulatory Visit

## 2024-05-15 DIAGNOSIS — M6281 Muscle weakness (generalized): Secondary | ICD-10-CM | POA: Diagnosis not present

## 2024-05-15 DIAGNOSIS — M5459 Other low back pain: Secondary | ICD-10-CM

## 2024-05-15 DIAGNOSIS — M546 Pain in thoracic spine: Secondary | ICD-10-CM | POA: Diagnosis not present

## 2024-05-15 NOTE — Therapy (Signed)
 OUTPATIENT PHYSICAL THERAPY THORACOLUMBAR TREATMENT   Patient Name: Dana Hensley MRN: [id number] DOB:1961-10-11, 63 y.o., female Today's Date: 05/15/2024  END OF SESSION:  PT End of Session - 05/15/24 1036     Visit Number 9    Number of Visits 25    Date for PT Re-Evaluation 06/23/24    Authorization Type [employer] auth#[id number]    Authorization Time Period 5/13-7/12    Authorization - Visit Number 8    Authorization - Number of Visits 10    Progress Note Due on Visit 10    PT Start Time 1035    PT Stop Time 1115    PT Time Calculation (min) 40 min    Activity Tolerance Patient tolerated treatment well    Behavior During Therapy WFL for tasks assessed/performed           Past Medical History:  Diagnosis Date   Allergy    Anxiety    Asthma    B12 deficiency    Barrett esophagus    Chronic atrophic gastritis    DVT, lower extremity (HCC)    Gastric polyps    HLD (hyperlipidemia)    Hypothyroidism    IGT (impaired glucose tolerance)    Neuroendocrine tumor    carcinoid tumors of the stomach, s/p extraction surgery at [facility]   PONV (postoperative nausea and vomiting)    Vitamin D  deficiency    Past Surgical History:  Procedure Laterality Date   APPENDECTOMY     CHOLECYSTECTOMY N/A 11/16/2016   Procedure: LAPAROSCOPIC CHOLECYSTECTOMY WITH INTRAOPERATIVE CHOLANGIOGRAM;  Surgeon: Benancio Bracket, MD;  Location: [facility] ORS;  Service: General;  Laterality: N/A;   COLONOSCOPY WITH PROPOFOL  N/A 04/21/2021   Procedure: COLONOSCOPY WITH PROPOFOL ;  Surgeon: Marnee Sink, MD;  Location: [facility] ENDOS[facility];  Service: Endoscopy;  Laterality: N/A;   DIAGNOSTIC LAPAROSCOPY     ESOPHAGOGASTRODUODENOSCOPY     TONSILLECTOMY     TOTAL ABDOMINAL HYSTERECTOMY W/ BILATERAL SALPINGOOPHORECTOMY  2012   due to ovar cysts/pain   VEIN LIGATION AND STRIPPING     Patient Active Problem List   Diagnosis Date Noted   Leg pain 02/04/2024   Other chest pain 08/02/2021   Chest pain  08/02/2021   Hx of colonic polyps    Hyperlipidemia, unspecified 08/29/2018   IGT (impaired glucose tolerance) 08/29/2018   Intrinsic atopic dermatitis 08/29/2018   History of adenomatous polyp of colon 04/01/2018   Elevated liver function tests 01/18/2018   Weight loss 01/15/2017   Pain in limb 12/18/2016   Calculus of gallbladder without cholecystitis without obstruction 10/23/2016   Chronic venous insufficiency 10/16/2016   Varicose veins of both lower extremities with pain 10/16/2016   Lymphedema 10/16/2016   Epigastric pain 10/03/2016   Barrett's esophagus determined by endoscopy 01/25/2016   Vitamin B12 deficiency 10/18/2015   Chronic neck and back pain 08/19/2015   Allergic rhinitis with postnasal drip 07/09/2015   Dyslipidemia 05/05/2015   Acquired hypothyroidism 05/05/2015   Carcinoid tumor 05/05/2015   History of malignant carcinoid tumor of stomach 03/26/2012    PCP: Raina Bunting, DO  REFERRING PROVIDER: Ludwig Safer, PA-C  REFERRING DIAG: R20.0,R20.2 (ICD-10-CM) - Numbness and tingling of both legs M54.2 (ICD-10-CM) - Neck pain G89.29,M54.42 (ICD-10-CM) - Chronic bilateral low back pain with left-sided sciatica  RATIONALE FOR EVALUATION AND TREATMENT: Rehabilitation  THERAPY DIAG: Other low back pain  Muscle weakness (generalized)  Pain in thoracic spine  ONSET DATE: Chronic (~20 years)   FOLLOW-UP APPT SCHEDULED WITH REFERRING PROVIDER:  No    SUBJECTIVE:                                                                                                                                                                                         SUBJECTIVE STATEMENT:  Chief Concern of Neck Pain and low back pain   PERTINENT HISTORY: Patient reports to OPPT with a chief complaint of neck and back pain.  Patient has been experiencing chronic LBP for ~20 yrs but symptoms have worsened in the last year. She reports termination from her job in 2023 and  within the last year she has been helping her son with care taking of her 2 grandchildren. Her son lives in a multistory home and she constantly has to perform stair navigation (15 stairs).  She reports increase in activities within the last year with care taking for her family and believes that the increase of activities has aggravated her back. Patient describes that currently her pain is located along her mid thoracic spine and radiates to her lumbar spine. Additionally, Pt reports that pain in her lumbar spine can radiates down the L posterior thigh and into the bottom of her foot. She describes the pain as sharp along the L leg and reports numbness and tingling in both feet. However she does have baseline neuropathy in her feet. She has occasional muscle spasms in both calves with increased activity throughout the day. She reports that she cannot take NSAIDS due to gastric conditions.  She denies changes to b/b, saddle anasthesia, abdominal pain, chills/fever, night sweats, nausea, vomiting,   Dominant hand: right  Imaging: Yes  CLINICAL DATA:  Chronic low back pain EXAM: LUMBAR SPINE - COMPLETE 4+ VIEW COMPARISON:  CT abdomen and pelvis 06/14/2017= FINDINGS: Normal anatomic alignment. No evidence for acute fracture or dislocation. Preservation of the vertebral body heights. Mild L5-S1 degenerative disc disease. Lower lumbar spine facet degenerative changes. Stool throughout the colon. Cholecystectomy clips. SI joints unremarkable. IMPRESSION: Lower lumbar spine degenerative disc and facet disease.  Electronically Signed   By: Jone Neither M.D.   On: 02/07/2024 23:18   PAIN:    Pain Intensity: Present: 6/10, Best: 3-4/10, Worst: 7-8/10 Pain location: Thoracic to Lumbar  Pain Quality: constant  Radiating: Yes  Focal Weakness: Yes Aggravating factors: Lifting, Bending, Kneeling Position Relieving factors: Walking and rest, lidocaine  patches 24-hour pain behavior: Activity Dependent,  worse in the evening How long can you sit: 15-20 How long can you stand: History of prior back injury, pain, surgery, or therapy: Yes  PRECAUTIONS: Fall  WEIGHT BEARING RESTRICTIONS: No  FALLS: Has patient fallen in last 6 months? No  Living Environment Lives with: lives with their family Lives in: House/apartment Stairs: No Has following equipment at home: None  Prior level of function: Independent  Occupational demands: Unemployed   Hobbies: Garden, Taking Care of [place] children, walking the dog   Patient Goals: To have no pain in my feet and back    OBJECTIVE:  Patient Surveys  Modified Oswestry 18 / 50 = 36.0 %    Cognition Patient is oriented to person, place, and time.  Recent memory is intact.  Remote memory is intact.  Attention span and concentration are intact.  Expressive speech is intact.  Patient's fund of knowledge is within normal limits for educational level.    Gross Musculoskeletal Assessment Tremor: None Bulk: Normal Tone: Normal  GAIT: Distance walked: 10 Assistive device utilized: None Level of assistance: Complete Independence Comments: Reciprocal WNL  Posture (Seated): Increased Thoracic Kyphosis Lumbar lordosis: WNL  AROM AROM (Normal range in degrees) AROM   Lumbar   Flexion (65) 100%  Extension (30) 100%  Right lateral flexion (25) 80% *   Left lateral flexion (25) 80%*   Right rotation (30) 100%  Left rotation (30) 100%      Hip Right Left  Flexion (125) WNL  WNL  Extension (15)    Abduction (40)    Adduction     Internal Rotation (45) 32 36  External Rotation (45)        Knee    Flexion (135)    Extension (0)        Ankle    Dorsiflexion (20)    Plantarflexion (50)    Inversion (35)    Eversion (15)    (* = pain; Blank rows = not tested)  LE MMT: MMT (out of 5) Right  Left   Hip flexion 4- 4-  Hip extension    Hip abduction 4- 4-  Hip adduction    Hip internal rotation    Hip external rotation     Knee flexion 4 4  Knee extension 4 4  Ankle dorsiflexion 5 5  Ankle plantarflexion 5 5  Ankle inversion    Ankle eversion    (* = pain; Blank rows = not tested)  Sensation Grossly intact to light touch throughout bilateral LEs as determined by testing dermatomes L2-S2. Proprioception, stereognosis, and hot/cold testing deferred on this date.  Reflexes Deferred   Muscle Length Hamstrings: R: Negative L: Negative  Palpation Location Right Left         Lumbar paraspinals 0 0  Quadratus Lumborum    Gluteus Maximus 0 0  Gluteus Medius 1 1  Deep hip external rotators 2 2  PSIS    Fortin's Area (SIJ)    Greater Trochanter    Thoracic Paraspinals (T7-T12)  1 1  Rhomboids  1  1  (Blank rows = not tested) Graded on 0-4 scale (0 = no pain, 1 = pain, 2 = pain with wincing/grimacing/flinching, 3 = pain with withdrawal, 4 = unwilling to allow palpation)  Passive Accessory Intervertebral Motion Pt with reproduction of back pain with CPA L1-L5 and bilaterally T6-T12. Generally, hypomobile throughout  Special Tests Lumbar Radiculopathy and Discogenic: Slump (SN 83, -LR 0.32): R: Deferred   L: Negative   SLR (SN 92, -LR 0.29): R: Deferred L:  Deferred  Crossed SLR (SP 90): R: Deferred   L: Deferred   Facet Joint: Extension-Rotation (SN 100, -LR 0.0): R: Negative L: Negative  Hip: FABER (SN 81): R: Negative L: Negative FADIR (SN  94): R: Negative L: Negative Hip scour (SN 50): R:  L:    Functional Tasks Deep squat: Narrow BoS, limited Ankle DF  Forward Step-Down Test: R: Hip IR/ADD L:  Lateral Step-Down Test: R: Hip IR/ADD L:   30s Sit to Stand: 16 reps  TODAY'S TREATMENT: DATE: 05/12/2024  Subjective: Patient reports 2-3/10 NPS throughout mid to lower back. She states back pain has been improving with PT interventions. Pt reports that her foot pain has improved slightly but still has pain towards the PM with increased activity.  No questions or concerns.   Therapeutic  Exercise:  NuStep L5-1 x 5 min x UE/LE (Seat 10) for LE warm up, endurance and strength; PT manually adjusted resistance throughout bout.; VC to keep SPM > 80    Omega Cable Machine:   Seated Lat Pull Down    1 x 10, 25#   2 x 10, 35#   Dead Bug Progression:  1 x 10, Alternating LE/UE   2 x 10, Alt. UE/LE with 2 Kg med ball overhead press   Quadruped Thoracic Rotation - Reach Under - Foam Roller Assist for thoracolumbar mobility  R/L: 2 x 10 ea   Thoracic Extension Mobilization on Foam Roll  1 x 10   30s STS: 16 reps   ROM assessment   R/L Hip Internal Rotation: 40/40  Therapeutic Activity:   Single Leg Bridge    1 x 10 RLE down    2 x 10 LLE Down    Half Kneeling Diagonal Chop    R Half Kneel: 1 x 10, 3 Kg MB, minor back pain reported; 1 x 10 2 Kg MB    L Half Kneeling: 2 x 10, 3 Kg Med Ball  PATIENT EDUCATION:  Education details: HEP, Exercise Technique  Person educated: Patient Education method: Explanation, Demonstration, and Handouts Education comprehension: verbalized understanding and returned demonstration   HOME EXERCISE PROGRAM:   Access Code: [email or web address] URL: [email or web address] Date: 05/07/2024 Prepared by: Veryl Gottron Keiasia Christianson  Exercises - Supine Figure 4 Piriformis Stretch  - 2 x daily - 7 x weekly - 3 sets - 30 hold - Child's Pose Stretch  - 2 x daily - 7 x weekly - 3 sets - 30 hold - Seated Hamstring Stretch  - 2 x daily - 7 x weekly - 3 sets - 30-60s hold - Supine Lower Trunk Rotation  - 1 x daily - 7 x weekly - 2-3 sets - 10-2 reps - Sidelying Open Book Thoracic Lumbar Rotation and Extension  - 1 x daily - 7 x weekly - 2-3 sets - 10-12 reps - Quadruped Thoracic Rotation - Reach Under  - 1 x daily - 7 x weekly - 3 sets - 10-12 reps - Supine Bridge  - 1 x daily - 7 x weekly - 3 sets - 10 reps - Squat with Resistance at Thighs  - 1 x daily - 3-4 x weekly - 2-3 sets - 10-12 reps - Forward Step Up  - 1 x daily - 3-4 x weekly - 2 sets -  10 reps - Forward Step Down Touch with Heel  - 1 x daily - 3-4 x weekly - 2 sets - 10 reps  Access Code: [email or web address] URL: [email or web address] Date: 04/08/2024 Prepared by: Veryl Gottron Jamala Kohen  Exercises - Supine Figure 4 Piriformis Stretch  - 2 x daily - 7 x weekly - 3 sets - 30 hold - Child's Pose Stretch  - 2 x daily - 7 x weekly - 3  sets - 30 hold - Seated Hamstring Stretch  - 2 x daily - 7 x weekly - 3 sets - 30-60s hold - Supine Lower Trunk Rotation  - 1 x daily - 7 x weekly - 2-3 sets - 10-2 reps - Sidelying Open Book Thoracic Lumbar Rotation and Extension  - 1 x daily - 7 x weekly - 2-3 sets - 10-12 reps - Quadruped Thoracic Rotation - Reach Under  - 1 x daily - 7 x weekly - 3 sets - 10-12 reps - Supine Bridge  - 1 x daily - 7 x weekly - 3 sets - 10 reps - Squat with Resistance at Thighs  - 1 x daily - 3-4 x weekly - 2-3 sets - 10-12 reps   ASSESSMENT:  CLINICAL IMPRESSION: Continued PT POC focused on improving thoracolumbar pain and functional strengthening. PT reassessed progress towards PT goals and personal goals as we reach end of authorization. Patient reports that her thoracolumbar pain has been improving since the start of her POC. Worst pain reported today was a 3/10 NPS in her mid/low back. She can tolerate walking farther distances however still has a consistent pain >5/10 in her feet. She also demonstrates improvements with hip IR ROM without lumbar pain. Although pain is improving her LE strength has remained the same since initial visit. However patient is still experiencing decreased activity tolerance, pain and stiffness along mid to lower back limiting her full participation with household ADLs and recreational activities.  Based on today's performance pt will continue to benefit from skilled physical therapy in order to maximize return to PLOF and improve QoL.   OBJECTIVE IMPAIRMENTS: decreased activity tolerance, decreased strength, hypomobility, impaired  flexibility, and pain.   ACTIVITY LIMITATIONS: carrying, lifting, bending, squatting, sleeping, transfers, and caring for others  PARTICIPATION LIMITATIONS: yard work  PERSONAL FACTORS: Age, Behavior pattern, Past/current experiences, and Time since onset of injury/illness/exacerbation are also affecting patient's functional outcome.   REHAB POTENTIAL: Good  CLINICAL DECISION MAKING: Evolving/moderate complexity  EVALUATION COMPLEXITY: Moderate   GOALS: Goals reviewed with patient? No  SHORT TERM GOALS: Target date: 06/26/2024  Pt will be independent with HEP in order to improve strength and decrease back pain to improve pain-free function at home and work. Baseline: 03/31/2024: Initial Provided; 05/12/24: Pt endorses 100% adherence to HEP.  Goal status: GOAL MET    LONG TERM GOALS: Target date: 08/07/2024  Pt will demonstrate ability to navigate 15 steps on stairs pain free to demonstrate significant improvement in functional movements and pain.  Baseline: Navigated 16 steps (6) without rail support pain free Goal status: Goal MET  2.  Pt will decrease worst back pain by at least 2 points on the NPRS in order to demonstrate clinically significant reduction in back pain. Baseline: 03/31/2024: 8/10 NPRS; 05/12/2024: 6/10 (middle) 4/10 low back; 05/15/2024: 3/10 (mid/low back)  Goal status: Progressing    3.  Pt will decrease mODI score by at least 13 points in order demonstrate clinically significant reduction in back pain/disability.       Baseline: 03/31/2024:  18 / 50 = 36.0 % (50/50 Max Disability); 05/15/2024: 16 / 50 = 32.0 % Goal status: Progressing  4.  Pt will increase in R/L internal ROM to 45 in order to demonstrate increased tissue extensibility and decrease in pain. Baseline: 03/31/2024 Internal Rotation (45) R: 32 L: 36  05/15/2024 40 40   Goal status: Progressing  5.  Pt will increase 30s STS by 5 reps in order to  demonstrate improvements in LE strength  and endurance Baseline: 03/31/2024: 16 reps; 05/12/2024; 16 reps Goal status: Progressing   PLAN: PT FREQUENCY: 1-2x/week  PT DURATION: 12 weeks  PLANNED INTERVENTIONS: Therapeutic exercises, Therapeutic activity, Neuromuscular re-education, Balance training, Gait training, Patient/Family education, Self Care, Joint mobilization, Joint manipulation, Vestibular training, Canalith repositioning, Orthotic/Fit training, DME instructions, Dry Needling, Electrical stimulation, Spinal manipulation, Spinal mobilization, Cryotherapy, Moist heat, Taping, Traction, Ultrasound, Ionotophoresis 4mg /ml Dexamethasone , Manual therapy, and Re-evaluation.  PLAN FOR NEXT SESSION:  Progress gluteal and core strengthening, Progress thoracolumbar mobility, progress functional strengthening. Lifting mechanics.    Satira Curet PT, DPT Physical Therapist- [facility]  05/15/2024, 10:38 AM

## 2024-05-16 DIAGNOSIS — J3089 Other allergic rhinitis: Secondary | ICD-10-CM | POA: Diagnosis not present

## 2024-05-16 DIAGNOSIS — J3081 Allergic rhinitis due to animal (cat) (dog) hair and dander: Secondary | ICD-10-CM | POA: Diagnosis not present

## 2024-05-16 DIAGNOSIS — J301 Allergic rhinitis due to pollen: Secondary | ICD-10-CM | POA: Diagnosis not present

## 2024-05-20 ENCOUNTER — Ambulatory Visit

## 2024-05-20 DIAGNOSIS — J301 Allergic rhinitis due to pollen: Secondary | ICD-10-CM | POA: Diagnosis not present

## 2024-05-20 DIAGNOSIS — J3081 Allergic rhinitis due to animal (cat) (dog) hair and dander: Secondary | ICD-10-CM | POA: Diagnosis not present

## 2024-05-20 DIAGNOSIS — M6281 Muscle weakness (generalized): Secondary | ICD-10-CM | POA: Diagnosis not present

## 2024-05-20 DIAGNOSIS — M5459 Other low back pain: Secondary | ICD-10-CM | POA: Diagnosis not present

## 2024-05-20 DIAGNOSIS — J3089 Other allergic rhinitis: Secondary | ICD-10-CM | POA: Diagnosis not present

## 2024-05-20 DIAGNOSIS — M546 Pain in thoracic spine: Secondary | ICD-10-CM

## 2024-05-20 NOTE — Therapy (Signed)
 OUTPATIENT PHYSICAL THERAPY THORACOLUMBAR TREATMENT/PROGRESS NOTE  Dates of reporting period  03/31/2024   to   05/20/2024     Patient Name: Dana Hensley MRN: [id number] DOB:12-22-1960, 63 y.o., female Today's Date: 05/20/2024  END OF SESSION:  PT End of Session - 05/20/24 1030     Visit Number 10    Number of Visits 25    Date for PT Re-Evaluation 06/23/24    Authorization Type [employer] auth#[id number]    Authorization Time Period 5/13-7/12    Authorization - Visit Number 9    Authorization - Number of Visits 10    Progress Note Due on Visit 10    PT Start Time 1030    PT Stop Time 1115    PT Time Calculation (min) 45 min    Activity Tolerance Patient tolerated treatment well    Behavior During Therapy WFL for tasks assessed/performed           Past Medical History:  Diagnosis Date   Allergy    Anxiety    Asthma    B12 deficiency    Barrett esophagus    Chronic atrophic gastritis    DVT, lower extremity (HCC)    Gastric polyps    HLD (hyperlipidemia)    Hypothyroidism    IGT (impaired glucose tolerance)    Neuroendocrine tumor    carcinoid tumors of the stomach, s/p extraction surgery at [facility]   PONV (postoperative nausea and vomiting)    Vitamin D  deficiency    Past Surgical History:  Procedure Laterality Date   APPENDECTOMY     CHOLECYSTECTOMY N/A 11/16/2016   Procedure: LAPAROSCOPIC CHOLECYSTECTOMY WITH INTRAOPERATIVE CHOLANGIOGRAM;  Surgeon: Larinda Unknown Sharps, MD;  Location: [facility] ORS;  Service: General;  Laterality: N/A;   COLONOSCOPY WITH PROPOFOL  N/A 04/21/2021   Procedure: COLONOSCOPY WITH PROPOFOL ;  Surgeon: Jinny Carmine, MD;  Location: [facility] ENDOSCOPY;  Service: Endoscopy;  Laterality: N/A;   DIAGNOSTIC LAPAROSCOPY     ESOPHAGOGASTRODUODENOSCOPY     TONSILLECTOMY     TOTAL ABDOMINAL HYSTERECTOMY W/ BILATERAL SALPINGOOPHORECTOMY  2012   due to ovar cysts/pain   VEIN LIGATION AND STRIPPING     Patient Active Problem List   Diagnosis Date  Noted   Leg pain 02/04/2024   Other chest pain 08/02/2021   Chest pain 08/02/2021   Hx of colonic polyps    Hyperlipidemia, unspecified 08/29/2018   IGT (impaired glucose tolerance) 08/29/2018   Intrinsic atopic dermatitis 08/29/2018   History of adenomatous polyp of colon 04/01/2018   Elevated liver function tests 01/18/2018   Weight loss 01/15/2017   Pain in limb 12/18/2016   Calculus of gallbladder without cholecystitis without obstruction 10/23/2016   Chronic venous insufficiency 10/16/2016   Varicose veins of both lower extremities with pain 10/16/2016   Lymphedema 10/16/2016   Epigastric pain 10/03/2016   Barrett's esophagus determined by endoscopy 01/25/2016   Vitamin B12 deficiency 10/18/2015   Chronic neck and back pain 08/19/2015   Allergic rhinitis with postnasal drip 07/09/2015   Dyslipidemia 05/05/2015   Acquired hypothyroidism 05/05/2015   Carcinoid tumor 05/05/2015   History of malignant carcinoid tumor of stomach 03/26/2012    PCP: Edman Marsa PARAS, DO  REFERRING PROVIDER: Ulis Bottcher, PA-C  REFERRING DIAG: R20.0,R20.2 (ICD-10-CM) - Numbness and tingling of both legs M54.2 (ICD-10-CM) - Neck pain G89.29,M54.42 (ICD-10-CM) - Chronic bilateral low back pain with left-sided sciatica  RATIONALE FOR EVALUATION AND TREATMENT: Rehabilitation  THERAPY DIAG: Other low back pain  Muscle weakness (generalized)  Pain in  thoracic spine  ONSET DATE: Chronic (~20 years)   FOLLOW-UP APPT SCHEDULED WITH REFERRING PROVIDER: No    SUBJECTIVE:                                                                                                                                                                                         SUBJECTIVE STATEMENT:  Chief Concern of Neck Pain and low back pain   PERTINENT HISTORY: Patient reports to OPPT with a chief complaint of neck and back pain.  Patient has been experiencing chronic LBP for ~20 yrs but symptoms have  worsened in the last year. She reports termination from her job in 2023 and within the last year she has been helping her son with care taking of her 2 grandchildren. Her son lives in a multistory home and she constantly has to perform stair navigation (15 stairs).  She reports increase in activities within the last year with care taking for her family and believes that the increase of activities has aggravated her back. Patient describes that currently her pain is located along her mid thoracic spine and radiates to her lumbar spine. Additionally, Pt reports that pain in her lumbar spine can radiates down the L posterior thigh and into the bottom of her foot. She describes the pain as sharp along the L leg and reports numbness and tingling in both feet. However she does have baseline neuropathy in her feet. She has occasional muscle spasms in both calves with increased activity throughout the day. She reports that she cannot take NSAIDS due to gastric conditions.  She denies changes to b/b, saddle anasthesia, abdominal pain, chills/fever, night sweats, nausea, vomiting,   Dominant hand: right  Imaging: Yes  CLINICAL DATA:  Chronic low back pain EXAM: LUMBAR SPINE - COMPLETE 4+ VIEW COMPARISON:  CT abdomen and pelvis 06/14/2017= FINDINGS: Normal anatomic alignment. No evidence for acute fracture or dislocation. Preservation of the vertebral body heights. Mild L5-S1 degenerative disc disease. Lower lumbar spine facet degenerative changes. Stool throughout the colon. Cholecystectomy clips. SI joints unremarkable. IMPRESSION: Lower lumbar spine degenerative disc and facet disease.  Electronically Signed   By: Bard Moats M.D.   On: 02/07/2024 23:18   PAIN:    Pain Intensity: Present: 6/10, Best: 3-4/10, Worst: 7-8/10 Pain location: Thoracic to Lumbar  Pain Quality: constant  Radiating: Yes  Focal Weakness: Yes Aggravating factors: Lifting, Bending, Kneeling Position Relieving factors:  Walking and rest, lidocaine  patches 24-hour pain behavior: Activity Dependent, worse in the evening How long can you sit: 15-20 How long can you stand: History of prior back injury, pain, surgery, or therapy: Yes  PRECAUTIONS: Fall  WEIGHT BEARING RESTRICTIONS: No  FALLS: Has patient fallen in last 6 months? No  Living Environment Lives with: lives with their family Lives in: House/apartment Stairs: No Has following equipment at home: None  Prior level of function: Independent  Occupational demands: Unemployed   Hobbies: Garden, Taking Care of [place] children, walking the dog   Patient Goals: To have no pain in my feet and back    OBJECTIVE:  Patient Surveys  Modified Oswestry 18 / 50 = 36.0 %    Cognition Patient is oriented to person, place, and time.  Recent memory is intact.  Remote memory is intact.  Attention span and concentration are intact.  Expressive speech is intact.  Patient's fund of knowledge is within normal limits for educational level.    Gross Musculoskeletal Assessment Tremor: None Bulk: Normal Tone: Normal  GAIT: Distance walked: 10 Assistive device utilized: None Level of assistance: Complete Independence Comments: Reciprocal WNL  Posture (Seated): Increased Thoracic Kyphosis Lumbar lordosis: WNL  AROM AROM (Normal range in degrees) AROM   Lumbar   Flexion (65) 100%  Extension (30) 100%  Right lateral flexion (25) 80% *   Left lateral flexion (25) 80%*   Right rotation (30) 100%  Left rotation (30) 100%      Hip Right Left  Flexion (125) WNL  WNL  Extension (15)    Abduction (40)    Adduction     Internal Rotation (45) 32 36  External Rotation (45)        Knee    Flexion (135)    Extension (0)        Ankle    Dorsiflexion (20)    Plantarflexion (50)    Inversion (35)    Eversion (15)    (* = pain; Blank rows = not tested)  LE MMT: MMT (out of 5) Right  Left   Hip flexion 4- 4-  Hip extension    Hip  abduction 4- 4-  Hip adduction    Hip internal rotation    Hip external rotation    Knee flexion 4 4  Knee extension 4 4  Ankle dorsiflexion 5 5  Ankle plantarflexion 5 5  Ankle inversion    Ankle eversion    (* = pain; Blank rows = not tested)  Sensation Grossly intact to light touch throughout bilateral LEs as determined by testing dermatomes L2-S2. Proprioception, stereognosis, and hot/cold testing deferred on this date.  Reflexes Deferred   Muscle Length Hamstrings: R: Negative L: Negative  Palpation Location Right Left         Lumbar paraspinals 0 0  Quadratus Lumborum    Gluteus Maximus 0 0  Gluteus Medius 1 1  Deep hip external rotators 2 2  PSIS    Fortin's Area (SIJ)    Greater Trochanter    Thoracic Paraspinals (T7-T12)  1 1  Rhomboids  1  1  (Blank rows = not tested) Graded on 0-4 scale (0 = no pain, 1 = pain, 2 = pain with wincing/grimacing/flinching, 3 = pain with withdrawal, 4 = unwilling to allow palpation)  Passive Accessory Intervertebral Motion Pt with reproduction of back pain with CPA L1-L5 and bilaterally T6-T12. Generally, hypomobile throughout  Special Tests Lumbar Radiculopathy and Discogenic: Slump (SN 83, -LR 0.32): R: Deferred   L: Negative   SLR (SN 92, -LR 0.29): R: Deferred L:  Deferred  Crossed SLR (SP 90): R: Deferred   L: Deferred   Facet Joint: Extension-Rotation (SN 100, -LR 0.0):  R: Negative L: Negative  Hip: FABER (SN 81): R: Negative L: Negative FADIR (SN 94): R: Negative L: Negative Hip scour (SN 50): R:  L:    Functional Tasks Deep squat: Narrow BoS, limited Ankle DF  Forward Step-Down Test: R: Hip IR/ADD L:  Lateral Step-Down Test: R: Hip IR/ADD L:   30s Sit to Stand: 16 reps  TODAY'S TREATMENT: DATE: 05/20/2024  Subjective: Patient reports 3-4/10 NPS throughout mid to lower back. Patient reports that she had severe foot pain following swimming session this past weekend. She is concerned about her feet pain not  improving. Pt reports that her podiatrist advised that foot pain is referring from the back yet. However Pt endorsed that her mid/lower back has improved since the start of PT.  No questions or concerns.   Therapeutic Exercise:  NuStep L5-1 x 5 min x UE/LE (Seat 10) for LE warm up, endurance and strength; PT manually adjusted resistance throughout bout.; VC to keep SPM > 80   30s STS: 13 Rep  Quadruped Thoracic Rotation - Reach Under with Foam Roller assist R/L:1 x 10 ea  Thoracic Extension on Foam Roller - Placed in mid-lower T spine.   1 x 10   Supine Alternating Leg Extension with OH Flexion Med Ball   3 x 10, 3 Kg MB   Supine Reverse Crunch from 90/90 with Med Ball Between Knees  2 x 10, 3 Kg MB  1 x 4, 3Kg MB - Straight legs    Supine Bridge with Med Ball Chest press  3 x 10 , 3 Kg MB  OMEGA Cable Machine  Lat Pull Down   Therapeutic Activity:   Kettlebell Squat for optimizing bending and lifting at home   2 x 10 20# KB   Kettle bell Swings  1 x 10 10# KB  1 x 6, 20# KB (minor pain reported in lower back)  1 x 6 10# KB   PATIENT EDUCATION:  Education details: HEP, Exercise Technique  Person educated: Patient Education method: Explanation, Demonstration, and Handouts Education comprehension: verbalized understanding and returned demonstration   HOME EXERCISE PROGRAM:   Access Code: [email or web address] URL: [email or web address] Date: 05/07/2024 Prepared by: Lonni Tyann Niehaus  Exercises - Supine Figure 4 Piriformis Stretch  - 2 x daily - 7 x weekly - 3 sets - 30 hold - Child's Pose Stretch  - 2 x daily - 7 x weekly - 3 sets - 30 hold - Seated Hamstring Stretch  - 2 x daily - 7 x weekly - 3 sets - 30-60s hold - Supine Lower Trunk Rotation  - 1 x daily - 7 x weekly - 2-3 sets - 10-2 reps - Sidelying Open Book Thoracic Lumbar Rotation and Extension  - 1 x daily - 7 x weekly - 2-3 sets - 10-12 reps - Quadruped Thoracic Rotation - Reach Under  - 1 x daily - 7 x  weekly - 3 sets - 10-12 reps - Supine Bridge  - 1 x daily - 7 x weekly - 3 sets - 10 reps - Squat with Resistance at Thighs  - 1 x daily - 3-4 x weekly - 2-3 sets - 10-12 reps - Forward Step Up  - 1 x daily - 3-4 x weekly - 2 sets - 10 reps - Forward Step Down Touch with Heel  - 1 x daily - 3-4 x weekly - 2 sets - 10 reps  Access Code: [email or web address] URL: [email or web address] Date: 04/08/2024 Prepared by: Lonni Pall  Exercises -  Supine Figure 4 Piriformis Stretch  - 2 x daily - 7 x weekly - 3 sets - 30 hold - Child's Pose Stretch  - 2 x daily - 7 x weekly - 3 sets - 30 hold - Seated Hamstring Stretch  - 2 x daily - 7 x weekly - 3 sets - 30-60s hold - Supine Lower Trunk Rotation  - 1 x daily - 7 x weekly - 2-3 sets - 10-2 reps - Sidelying Open Book Thoracic Lumbar Rotation and Extension  - 1 x daily - 7 x weekly - 2-3 sets - 10-12 reps - Quadruped Thoracic Rotation - Reach Under  - 1 x daily - 7 x weekly - 3 sets - 10-12 reps - Supine Bridge  - 1 x daily - 7 x weekly - 3 sets - 10 reps - Squat with Resistance at Thighs  - 1 x daily - 3-4 x weekly - 2-3 sets - 10-12 reps   ASSESSMENT:  CLINICAL IMPRESSION: Patient arriving to 10th visit warranting a progress note. Patient has demonstrated improvements in functional strength, thoracolumbar pain and ROM (see goals below). She still has moderate to severe bilateral foot pain but it seems to improve following PT interventions and HEP adherence. In recent reassessment towards goals she endorsed walking longer distances and stairs have became easier to navigate indicating improved LE strength. Based on her most recent mODI score the patient is trending towards improvements in less disability due to lower back pain. Current limitations still include decreased activity tolerance, intermittent pain and stiffness along mid to lower back limiting full participation with prolonged household ADLs and recreational activities. Based on today's  performance, PT strongly recommends continued skilled physical therapy in order facilitate return to PLOF and improved QoL.    OBJECTIVE IMPAIRMENTS: decreased activity tolerance, decreased strength, hypomobility, impaired flexibility, and pain.   ACTIVITY LIMITATIONS: carrying, lifting, bending, squatting, sleeping, transfers, and caring for others  PARTICIPATION LIMITATIONS: yard work  PERSONAL FACTORS: Age, Behavior pattern, Past/current experiences, and Time since onset of injury/illness/exacerbation are also affecting patient's functional outcome.   REHAB POTENTIAL: Good  CLINICAL DECISION MAKING: Evolving/moderate complexity  EVALUATION COMPLEXITY: Moderate   GOALS: Goals reviewed with patient? No  SHORT TERM GOALS: Target date: 07/01/2024  Pt will be independent with HEP in order to improve strength and decrease back pain to improve pain-free function at home and work. Baseline: 03/31/2024: Initial Provided; 05/12/24: Pt endorses 100% adherence to HEP.  Goal status: GOAL MET    LONG TERM GOALS: Target date: 08/12/2024  Pt will demonstrate ability to navigate 15 steps on stairs pain free to demonstrate significant improvement in functional movements and pain.  Baseline: Navigated 16 steps (6) without rail support pain free Goal status: Goal MET  2.  Pt will decrease worst back pain by at least 2 points on the NPRS in order to demonstrate clinically significant reduction in back pain. Baseline: 03/31/2024: 8/10 NPRS; 05/12/2024: 6/10 (middle) 4/10 low back; 05/15/2024: 3/10 (mid/low back); 05/20/2024: 3-4/10 middle/low back  Goal status: Progressing      3.  Pt will decrease mODI score by at least 13 points in order demonstrate clinically significant reduction in back pain/disability.       Baseline: 03/31/2024:  18 / 50 = 36.0 % (50/50 Max Disability); 05/15/2024: 16 / 50 = 32.0 % Goal status: Progressing  4.  Pt will increase in R/L internal ROM to 45 in order to  demonstrate increased tissue extensibility and decrease in pain. Baseline: 03/31/2024  Internal Rotation (45) R: 32 L: 36  05/15/2024 40 40   Goal status: Progressing  5.  Pt will increase 30s STS by 5 reps in order to demonstrate improvements in LE strength and endurance Baseline: 03/31/2024: 16 reps; 05/12/2024; 16 reps; 05/20/2024: 13 reps Goal status: Progressing   PLAN: PT FREQUENCY: 1-2x/week  PT DURATION: 12 weeks  PLANNED INTERVENTIONS: Therapeutic exercises, Therapeutic activity, Neuromuscular re-education, Balance training, Gait training, Patient/Family education, Self Care, Joint mobilization, Joint manipulation, Vestibular training, Canalith repositioning, Orthotic/Fit training, DME instructions, Dry Needling, Electrical stimulation, Spinal manipulation, Spinal mobilization, Cryotherapy, Moist heat, Taping, Traction, Ultrasound, Ionotophoresis 4mg /ml Dexamethasone , Manual therapy, and Re-evaluation.  PLAN FOR NEXT SESSION:  Progress gluteal and core strengthening, Progress thoracolumbar mobility, progress functional strengthening. Lifting mechanics.    Lonni Pall PT, DPT Physical Therapist- [facility]  05/20/2024, 10:31 AM

## 2024-05-21 ENCOUNTER — Encounter: Payer: Self-pay | Admitting: Physician Assistant

## 2024-05-21 ENCOUNTER — Ambulatory Visit: Admitting: Physician Assistant

## 2024-05-21 VITALS — BP 128/90 | Ht 65.0 in | Wt 177.0 lb

## 2024-05-21 DIAGNOSIS — M792 Neuralgia and neuritis, unspecified: Secondary | ICD-10-CM | POA: Diagnosis not present

## 2024-05-21 DIAGNOSIS — G8929 Other chronic pain: Secondary | ICD-10-CM | POA: Diagnosis not present

## 2024-05-21 DIAGNOSIS — M542 Cervicalgia: Secondary | ICD-10-CM | POA: Diagnosis not present

## 2024-05-21 DIAGNOSIS — M549 Dorsalgia, unspecified: Secondary | ICD-10-CM

## 2024-05-21 MED ORDER — GABAPENTIN 100 MG PO CAPS
100.0000 mg | ORAL_CAPSULE | Freq: Every day | ORAL | 3 refills | Status: DC
Start: 2024-05-21 — End: 2024-07-07

## 2024-05-21 NOTE — Addendum Note (Signed)
 Addended by: Zayvien Canning W on: 05/21/2024 03:32 PM   Modules accepted: Orders

## 2024-05-21 NOTE — Progress Notes (Signed)
 Referring Physician:  Edman Marsa PARAS, DO [address] [place],  [place] [address]  Primary Physician:  Edman Marsa PARAS, DO  History of Present Illness: 05/21/2024  Patient comes in today for follow-up on her chronic neck and back pain.  She has been going to physical therapy which is helping quite a bit especially with her stiffness.  She continues to have significant neuropathic pain in her feet.  She did have her EMG completed close at an outside facility have yet to see the results.  She notices that the pain in her feet is worse in the evenings when she is trying to relax and often burns quite a bit.  No new weakness or additional neuropathic pain noted.  03/26/24 [name] is here today with a chief complaint of neck and back pain.  This has been ongoing for 20 years however has become progressively worse over the past year.  Primarily the pain is in her back that radiates down the back of her left leg and goes into the bottom of her foot.  She states this pain is sharp in nature and is accompanied with numbness and tingling in her foot.  She also has numbness and tingling in her right foot without radiating pain from her back down her leg.  She describes it as she has a thick pad on the bottom of her feet and that at times she feels as though she is walking with water in her shoes.  She states it has become more difficult to sleep.  She denies any weakness or dysfunction to bowel and bladder.  She states that she is unable to take NSAIDs due to recommendations from her gastrointestinal physician.  Previously she has undergone chiropractic care which was helpful.  Duration: 1 year Severity: 6/10  Precipitating: aggravated by bending, lifting Modifying factors: made better by laying in bed Weakness: none Timing: constant Bowel/Bladder Dysfunction: none  Conservative measures: saw a chiropractor, massage therapy Physical therapy: has participated in pelvic therapy  about 6 years ago, this did help Multimodal medical therapy including regular antiinflammatories: none Injections: no epidural steroid injections  Past Surgery: no spinal surgeries   Dana  I Hensley has no symptoms of cervical myelopathy.  The symptoms are causing a significant impact on the patient's life.   Review of Systems:  A 10 point review of systems is negative, except for the pertinent positives and negatives detailed in the HPI.  Past Medical History: Past Medical History:  Diagnosis Date   Allergy    Anxiety    Asthma    B12 deficiency    Barrett esophagus    Chronic atrophic gastritis    DVT, lower extremity (HCC)    Gastric polyps    HLD (hyperlipidemia)    Hypothyroidism    IGT (impaired glucose tolerance)    Neuroendocrine tumor    carcinoid tumors of the stomach, s/p extraction surgery at [facility]   PONV (postoperative nausea and vomiting)    Vitamin D  deficiency     Past Surgical History: Past Surgical History:  Procedure Laterality Date   APPENDECTOMY     CHOLECYSTECTOMY N/A 11/16/2016   Procedure: LAPAROSCOPIC CHOLECYSTECTOMY WITH INTRAOPERATIVE CHOLANGIOGRAM;  Surgeon: Larinda Unknown Sharps, MD;  Location: [facility] ORS;  Service: General;  Laterality: N/A;   COLONOSCOPY WITH PROPOFOL  N/A 04/21/2021   Procedure: COLONOSCOPY WITH PROPOFOL ;  Surgeon: Jinny Carmine, MD;  Location: [facility];  Service: Endoscopy;  Laterality: N/A;   DIAGNOSTIC LAPAROSCOPY  ESOPHAGOGASTRODUODENOSCOPY     TONSILLECTOMY     TOTAL ABDOMINAL HYSTERECTOMY W/ BILATERAL SALPINGOOPHORECTOMY  2012   due to ovar cysts/pain   VEIN LIGATION AND STRIPPING      Allergies: Allergies as of 05/21/2024 - Review Complete 05/21/2024  Allergen Reaction Noted   Latex Swelling 12/22/2014   Neomycin Other (See Comments) and Rash 07/09/2015   Neosporin  [neomycin-bacitracin zn-polymyx] Other (See Comments) 07/09/2015   Neomycin-polymyxin-gramicidin  07/09/2015   Other Other (See Comments)  07/09/2015    Medications: Outpatient Encounter Medications as of 05/21/2024  Medication Sig   cetirizine (ZYRTEC) 10 MG tablet Take 10 mg by mouth daily.   cyanocobalamin  (VITAMIN B12) 1000 MCG tablet Take by mouth.   cyanocobalamin  (VITAMIN B12) 1000 MCG/ML injection Inject 1 mL (1,000 mcg total) into the muscle every 30 (thirty) days.   dicyclomine  (BENTYL ) 10 MG capsule Take by mouth.   fluticasone  (FLONASE ) 50 MCG/ACT nasal spray Place 2 sprays into both nostrils daily.   levothyroxine  (SYNTHROID ) 75 MCG tablet Take 1 tablet (75 mcg total) by mouth daily before breakfast.   Multiple Vitamin (MULTI-VITAMINS) TABS Take 1 tablet by mouth daily.    rosuvastatin  (CRESTOR ) 10 MG tablet Take 1 tablet (10 mg total) by mouth daily.   Tuberculin-Allergy Syringes (BD ALLERGY SYRINGE) 27G X 3/8 1 ML MISC every Wednesday Patient receives allergy shot once a week.   No facility-administered encounter medications on file as of 05/21/2024.    Social History: Social History   Tobacco Use   Smoking status: Never   Smokeless tobacco: Never  Vaping Use   Vaping status: Never Used  Substance Use Topics   Alcohol use: No    Alcohol/week: 0.0 standard drinks of alcohol   Drug use: No    Family Medical History: Family History  Problem Relation Age of Onset   Clotting disorder Mother    Hyperlipidemia Father    Breast cancer Neg Hx     Physical Examination: @VITALWITHPAIN @  General: Patient is well developed, well nourished, calm, collected, and in no apparent distress. Attention to examination is appropriate.  Psychiatric: Patient is non-anxious.  Head:  Pupils equal, round, and reactive to light.  ENT:  Oral mucosa appears well hydrated.  Neck:   Supple.  Full range of motion.  Respiratory: Patient is breathing without any difficulty.  Extremities: No edema.  Vascular: Palpable dorsal pedal pulses.  Skin:   On exposed skin, there are no abnormal skin  lesions.  NEUROLOGICAL:     Awake, alert, oriented to person, place, and time.  Speech is clear and fluent. Fund of knowledge is appropriate.   Cranial Nerves: Pupils equal round and reactive to light.  Facial tone is symmetric.    ROM of spine:  She does have some tenderness palpation of her lumbar paraspinals.    Strength:  Side Iliopsoas Quads Hamstring PF DF EHL  R 5 5 5 5 5 5   L 5 5 5 5 5 5     Hoffman's is absent.  Clonus is not present.  Toes are down-going.  Bilateral upper and lower extremity sensation is intact to light touch, however significantly decrease sensation in bilateral feet. Gait is normal.   No difficulty with tandem gait.   No evidence of dysmetria noted.  Medical Decision Making  Imaging: Lumbar spine (02/07/24):  EXAM: LUMBAR SPINE - COMPLETE 4+ VIEW   COMPARISON:  CT abdomen and pelvis 06/14/2017   FINDINGS: Normal anatomic alignment. No evidence for acute fracture or dislocation.  Preservation of the vertebral body heights. Mild L5-S1 degenerative disc disease. Lower lumbar spine facet degenerative changes. Stool throughout the colon. Cholecystectomy clips. SI joints unremarkable.   IMPRESSION: Lower lumbar spine degenerative disc and facet disease.  Thoracic spine (02/07/24):   EXAM: THORACIC SPINE - 3 VIEWS   COMPARISON:  CT chest 08/02/2021   FINDINGS: Normal anatomic alignment. No acute fracture or dislocation. Mild midthoracic spine degenerative disc disease. Lungs are clear.   IMPRESSION: Mild midthoracic spine degenerative disc disease.  I have personally reviewed the images and agree with the above interpretation.  Assessment and Plan: Ms. Wickham is a pleasant 63 y.o. female is is here for follow-up for chronic neck and back pain.  This has improved significantly since she was last seen by me in clinic.  She feels that physical therapy is working very well.  However she is continuing to needing to have quite a bit of  neuropathic pain in bilateral feet.  Unfortunately her EMG report from outside clinic has not yet been dictated.  Plan to review report once complete and discussed with her starting gabapentin for her pain.  The risk and benefits of this medication were discussed at length.  Plan for 2-week phone visit to discuss EMG at that time as well as gabapentin.  Encouraged her transferring meantime for questions or concerns.   Thank you for involving me in the care of this patient.   I spent a total of 20 minutes in both face-to-face and non-face-to-face activities for this visit on the date of this encounter preparing to see the patient.  Lyle Decamp, PA-C Dept. of Neurosurgery

## 2024-05-22 ENCOUNTER — Ambulatory Visit: Payer: Self-pay

## 2024-05-22 NOTE — Therapy (Incomplete)
 OUTPATIENT PHYSICAL THERAPY THORACOLUMBAR TREATMENT    Patient Name: Allan  TNYA ADES MRN: [id number] DOB:18-Nov-1961, 63 y.o., female Today's Date: 05/22/2024  END OF SESSION:     Past Medical History:  Diagnosis Date   Allergy    Anxiety    Asthma    B12 deficiency    Barrett esophagus    Chronic atrophic gastritis    DVT, lower extremity (HCC)    Gastric polyps    HLD (hyperlipidemia)    Hypothyroidism    IGT (impaired glucose tolerance)    Neuroendocrine tumor    carcinoid tumors of the stomach, s/p extraction surgery at [facility]   PONV (postoperative nausea and vomiting)    Vitamin D  deficiency    Past Surgical History:  Procedure Laterality Date   APPENDECTOMY     CHOLECYSTECTOMY N/A 11/16/2016   Procedure: LAPAROSCOPIC CHOLECYSTECTOMY WITH INTRAOPERATIVE CHOLANGIOGRAM;  Surgeon: Larinda Unknown Sharps, MD;  Location: [facility] OR[facility];  Service: General;  Laterality: N/A;   COLONOSCOPY WITH PROPOFOL  N/A 04/21/2021   Procedure: COLONOSCOPY WITH PROPOFOL ;  Surgeon: Jinny Carmine, MD;  Location: [facility];  Service: Endoscopy;  Laterality: N/A;   DIAGNOSTIC LAPAROSCOPY     ESOPHAGOGASTRODUODENOSCOPY     TONSILLECTOMY     TOTAL ABDOMINAL HYSTERECTOMY W/ BILATERAL SALPINGOOPHORECTOMY  2012   due to ovar cysts/pain   VEIN LIGATION AND STRIPPING     Patient Active Problem List   Diagnosis Date Noted   Leg pain 02/04/2024   Other chest pain 08/02/2021   Chest pain 08/02/2021   Hx of colonic polyps    Hyperlipidemia, unspecified 08/29/2018   IGT (impaired glucose tolerance) 08/29/2018   Intrinsic atopic dermatitis 08/29/2018   History of adenomatous polyp of colon 04/01/2018   Elevated liver function tests 01/18/2018   Weight loss 01/15/2017   Pain in limb 12/18/2016   Calculus of gallbladder without cholecystitis without obstruction 10/23/2016   Chronic venous insufficiency 10/16/2016   Varicose veins of both lower extremities with pain 10/16/2016   Lymphedema 10/16/2016    Epigastric pain 10/03/2016   Barrett's esophagus determined by endoscopy 01/25/2016   Vitamin B12 deficiency 10/18/2015   Chronic neck and back pain 08/19/2015   Allergic rhinitis with postnasal drip 07/09/2015   Dyslipidemia 05/05/2015   Acquired hypothyroidism 05/05/2015   Carcinoid tumor 05/05/2015   History of malignant carcinoid tumor of stomach 03/26/2012    PCP: Edman Marsa PARAS, DO  REFERRING PROVIDER: Ulis Bottcher, PA-C  REFERRING DIAG: R20.0,R20.2 (ICD-10-CM) - Numbness and tingling of both legs M54.2 (ICD-10-CM) - Neck pain G89.29,M54.42 (ICD-10-CM) - Chronic bilateral low back pain with left-sided sciatica  RATIONALE FOR EVALUATION AND TREATMENT: Rehabilitation  THERAPY DIAG: Other low back pain  Muscle weakness (generalized)  Pain in thoracic spine  ONSET DATE: Chronic (~20 years)   FOLLOW-UP APPT SCHEDULED WITH REFERRING PROVIDER: No    SUBJECTIVE:  SUBJECTIVE STATEMENT:  Chief Concern of Neck Pain and low back pain   PERTINENT HISTORY: Patient reports to OPPT with a chief complaint of neck and back pain.  Patient has been experiencing chronic LBP for ~20 yrs but symptoms have worsened in the last year. She reports termination from her job in 2023 and within the last year she has been helping her son with care taking of her 2 grandchildren. Her son lives in a multistory home and she constantly has to perform stair navigation (15 stairs).  She reports increase in activities within the last year with care taking for her family and believes that the increase of activities has aggravated her back. Patient describes that currently her pain is located along her mid thoracic spine and radiates to her lumbar spine. Additionally, Pt reports that pain in her lumbar spine can radiates  down the L posterior thigh and into the bottom of her foot. She describes the pain as sharp along the L leg and reports numbness and tingling in both feet. However she does have baseline neuropathy in her feet. She has occasional muscle spasms in both calves with increased activity throughout the day. She reports that she cannot take NSAIDS due to gastric conditions.  She denies changes to b/b, saddle anasthesia, abdominal pain, chills/fever, night sweats, nausea, vomiting,   Dominant hand: right  Imaging: Yes  CLINICAL DATA:  Chronic low back pain EXAM: LUMBAR SPINE - COMPLETE 4+ VIEW COMPARISON:  CT abdomen and pelvis 06/14/2017= FINDINGS: Normal anatomic alignment. No evidence for acute fracture or dislocation. Preservation of the vertebral body heights. Mild L5-S1 degenerative disc disease. Lower lumbar spine facet degenerative changes. Stool throughout the colon. Cholecystectomy clips. SI joints unremarkable. IMPRESSION: Lower lumbar spine degenerative disc and facet disease.  Electronically Signed   By: Bard Moats M.D.   On: 02/07/2024 23:18   PAIN:    Pain Intensity: Present: 6/10, Best: 3-4/10, Worst: 7-8/10 Pain location: Thoracic to Lumbar  Pain Quality: constant  Radiating: Yes  Focal Weakness: Yes Aggravating factors: Lifting, Bending, Kneeling Position Relieving factors: Walking and rest, lidocaine  patches 24-hour pain behavior: Activity Dependent, worse in the evening How long can you sit: 15-20 How long can you stand: History of prior back injury, pain, surgery, or therapy: Yes  PRECAUTIONS: Fall  WEIGHT BEARING RESTRICTIONS: No  FALLS: Has patient fallen in last 6 months? No  Living Environment Lives with: lives with their family Lives in: House/apartment Stairs: No Has following equipment at home: None  Prior level of function: Independent  Occupational demands: Unemployed   Hobbies: Garden, Taking Care of [place] children, walking the dog    Patient Goals: To have no pain in my feet and back    OBJECTIVE:  Patient Surveys  Modified Oswestry 18 / 50 = 36.0 %    Cognition Patient is oriented to person, place, and time.  Recent memory is intact.  Remote memory is intact.  Attention span and concentration are intact.  Expressive speech is intact.  Patient's fund of knowledge is within normal limits for educational level.    Gross Musculoskeletal Assessment Tremor: None Bulk: Normal Tone: Normal  GAIT: Distance walked: 10 Assistive device utilized: None Level of assistance: Complete Independence Comments: Reciprocal WNL  Posture (Seated): Increased Thoracic Kyphosis Lumbar lordosis: WNL  AROM AROM (Normal range in degrees) AROM   Lumbar   Flexion (65) 100%  Extension (30) 100%  Right lateral flexion (25) 80% *   Left lateral flexion (25) 80%*  Right rotation (30) 100%  Left rotation (30) 100%      Hip Right Left  Flexion (125) WNL  WNL  Extension (15)    Abduction (40)    Adduction     Internal Rotation (45) 32 36  External Rotation (45)        Knee    Flexion (135)    Extension (0)        Ankle    Dorsiflexion (20)    Plantarflexion (50)    Inversion (35)    Eversion (15)    (* = pain; Blank rows = not tested)  LE MMT: MMT (out of 5) Right  Left   Hip flexion 4- 4-  Hip extension    Hip abduction 4- 4-  Hip adduction    Hip internal rotation    Hip external rotation    Knee flexion 4 4  Knee extension 4 4  Ankle dorsiflexion 5 5  Ankle plantarflexion 5 5  Ankle inversion    Ankle eversion    (* = pain; Blank rows = not tested)  Sensation Grossly intact to light touch throughout bilateral LEs as determined by testing dermatomes L2-S2. Proprioception, stereognosis, and hot/cold testing deferred on this date.  Reflexes Deferred   Muscle Length Hamstrings: R: Negative L: Negative  Palpation Location Right Left         Lumbar paraspinals 0 0  Quadratus Lumborum     Gluteus Maximus 0 0  Gluteus Medius 1 1  Deep hip external rotators 2 2  PSIS    Fortin's Area (SIJ)    Greater Trochanter    Thoracic Paraspinals (T7-T12)  1 1  Rhomboids  1  1  (Blank rows = not tested) Graded on 0-4 scale (0 = no pain, 1 = pain, 2 = pain with wincing/grimacing/flinching, 3 = pain with withdrawal, 4 = unwilling to allow palpation)  Passive Accessory Intervertebral Motion Pt with reproduction of back pain with CPA L1-L5 and bilaterally T6-T12. Generally, hypomobile throughout  Special Tests Lumbar Radiculopathy and Discogenic: Slump (SN 83, -LR 0.32): R: Deferred   L: Negative   SLR (SN 92, -LR 0.29): R: Deferred L:  Deferred  Crossed SLR (SP 90): R: Deferred   L: Deferred   Facet Joint: Extension-Rotation (SN 100, -LR 0.0): R: Negative L: Negative  Hip: FABER (SN 81): R: Negative L: Negative FADIR (SN 94): R: Negative L: Negative Hip scour (SN 50): R:  L:    Functional Tasks Deep squat: Narrow BoS, limited Ankle DF  Forward Step-Down Test: R: Hip IR/ADD L:  Lateral Step-Down Test: R: Hip IR/ADD L:   30s Sit to Stand: 16 reps  TODAY'S TREATMENT: DATE: 05/22/2024  Subjective: Patient reports ***/10 NPS in mid back ***/10 in the lower back. Continued neuropathic pain bilateral feet which gets worse in the evening. Patient reports that she f/u with referring provider yesterday regarding progress with PT.  No questions or concerns.   Therapeutic Exercise:  NuStep L5-1 x 5 min x UE/LE (Seat 10) for LE warm up, endurance and strength; PT manually adjusted resistance throughout bout.; VC to keep SPM > 80   Quadruped Thoracic Rotation - Reach Under with Foam Roller assist R/L:1 x 10 ea  Thoracic Extension on Foam Roller - Placed in mid-lower T spine.   1 x 10   Supine Alternating Leg Extension with OH Flexion Med Ball   3 x 10, 3 Kg MB   Supine Reverse Crunch from 90/90 with Med  Ball Between Knees  2 x 10, 3 Kg MB  1 x 4, 3Kg MB - Straight legs     Supine Bridge with Med Ball Chest press  3 x 10 , 3 Kg MB  OMEGA Cable Machine  Lat Pull Down   Therapeutic Activity:   Kettlebell Squat for optimizing bending and lifting at home   2 x 10 20# KB   Kettle bell Swings  1 x 10 10# KB  1 x 6, 20# KB (minor pain reported in lower back)  1 x 6 10# KB   PATIENT EDUCATION:  Education details: HEP, Exercise Technique  Person educated: Patient Education method: Explanation, Demonstration, and Handouts Education comprehension: verbalized understanding and returned demonstration   HOME EXERCISE PROGRAM:   Access Code: [email or web address] URL: [email or web address] Date: 05/07/2024 Prepared by: Lonni Deletha Jaffee  Exercises - Supine Figure 4 Piriformis Stretch  - 2 x daily - 7 x weekly - 3 sets - 30 hold - Child's Pose Stretch  - 2 x daily - 7 x weekly - 3 sets - 30 hold - Seated Hamstring Stretch  - 2 x daily - 7 x weekly - 3 sets - 30-60s hold - Supine Lower Trunk Rotation  - 1 x daily - 7 x weekly - 2-3 sets - 10-2 reps - Sidelying Open Book Thoracic Lumbar Rotation and Extension  - 1 x daily - 7 x weekly - 2-3 sets - 10-12 reps - Quadruped Thoracic Rotation - Reach Under  - 1 x daily - 7 x weekly - 3 sets - 10-12 reps - Supine Bridge  - 1 x daily - 7 x weekly - 3 sets - 10 reps - Squat with Resistance at Thighs  - 1 x daily - 3-4 x weekly - 2-3 sets - 10-12 reps - Forward Step Up  - 1 x daily - 3-4 x weekly - 2 sets - 10 reps - Forward Step Down Touch with Heel  - 1 x daily - 3-4 x weekly - 2 sets - 10 reps  Access Code: [email or web address] URL: [email or web address] Date: 04/08/2024 Prepared by: Lonni Marque Rademaker  Exercises - Supine Figure 4 Piriformis Stretch  - 2 x daily - 7 x weekly - 3 sets - 30 hold - Child's Pose Stretch  - 2 x daily - 7 x weekly - 3 sets - 30 hold - Seated Hamstring Stretch  - 2 x daily - 7 x weekly - 3 sets - 30-60s hold - Supine Lower Trunk Rotation  - 1 x daily - 7 x weekly - 2-3 sets - 10-2  reps - Sidelying Open Book Thoracic Lumbar Rotation and Extension  - 1 x daily - 7 x weekly - 2-3 sets - 10-12 reps - Quadruped Thoracic Rotation - Reach Under  - 1 x daily - 7 x weekly - 3 sets - 10-12 reps - Supine Bridge  - 1 x daily - 7 x weekly - 3 sets - 10 reps - Squat with Resistance at Thighs  - 1 x daily - 3-4 x weekly - 2-3 sets - 10-12 reps   ASSESSMENT:  CLINICAL IMPRESSION: Continued PT POC with a main focus in management of mid/lower back pain. Patient continues to demonstrate *** with functional strength. She still has pain in bilateral feet and neck. Current limitations still include decreased activity tolerance, intermittent pain and stiffness along mid to lower back limiting full participation with prolonged household ADLs and recreational activities. Based on today's performance, PT strongly recommends continued skilled physical  therapy in order facilitate return to PLOF and improved QoL.     OBJECTIVE IMPAIRMENTS: decreased activity tolerance, decreased strength, hypomobility, impaired flexibility, and pain.   ACTIVITY LIMITATIONS: carrying, lifting, bending, squatting, sleeping, transfers, and caring for others  PARTICIPATION LIMITATIONS: yard work  PERSONAL FACTORS: Age, Behavior pattern, Past/current experiences, and Time since onset of injury/illness/exacerbation are also affecting patient's functional outcome.   REHAB POTENTIAL: Good  CLINICAL DECISION MAKING: Evolving/moderate complexity  EVALUATION COMPLEXITY: Moderate   GOALS: Goals reviewed with patient? No  SHORT TERM GOALS: Target date: 07/03/2024  Pt will be independent with HEP in order to improve strength and decrease back pain to improve pain-free function at home and work. Baseline: 03/31/2024: Initial Provided; 05/12/24: Pt endorses 100% adherence to HEP.  Goal status: GOAL MET    LONG TERM GOALS: Target date: 08/14/2024  Pt will demonstrate ability to navigate 15 steps on stairs pain free  to demonstrate significant improvement in functional movements and pain.  Baseline: Navigated 16 steps (6) without rail support pain free Goal status: Goal MET  2.  Pt will decrease worst back pain by at least 2 points on the NPRS in order to demonstrate clinically significant reduction in back pain. Baseline: 03/31/2024: 8/10 NPRS; 05/12/2024: 6/10 (middle) 4/10 low back; 05/15/2024: 3/10 (mid/low back); 05/20/2024: 3-4/10 middle/low back  Goal status: Progressing      3.  Pt will decrease mODI score by at least 13 points in order demonstrate clinically significant reduction in back pain/disability.       Baseline: 03/31/2024:  18 / 50 = 36.0 % (50/50 Max Disability); 05/15/2024: 16 / 50 = 32.0 % Goal status: Progressing  4.  Pt will increase in R/L internal ROM to 45 in order to demonstrate increased tissue extensibility and decrease in pain. Baseline: 03/31/2024 Internal Rotation (45) R: 32 L: 36  05/15/2024 40 40   Goal status: Progressing  5.  Pt will increase 30s STS by 5 reps in order to demonstrate improvements in LE strength and endurance Baseline: 03/31/2024: 16 reps; 05/12/2024; 16 reps; 05/20/2024: 13 reps Goal status: Progressing   PLAN: PT FREQUENCY: 1-2x/week  PT DURATION: 12 weeks  PLANNED INTERVENTIONS: Therapeutic exercises, Therapeutic activity, Neuromuscular re-education, Balance training, Gait training, Patient/Family education, Self Care, Joint mobilization, Joint manipulation, Vestibular training, Canalith repositioning, Orthotic/Fit training, DME instructions, Dry Needling, Electrical stimulation, Spinal manipulation, Spinal mobilization, Cryotherapy, Moist heat, Taping, Traction, Ultrasound, Ionotophoresis 4mg /ml Dexamethasone , Manual therapy, and Re-evaluation.  PLAN FOR NEXT SESSION:  Progress gluteal and core strengthening, Progress thoracolumbar mobility, progress functional strengthening. Lifting mechanics.    Lonni Pall PT, DPT Physical  Therapist- [facility]  05/22/2024, 10:22 AM

## 2024-05-23 DIAGNOSIS — J301 Allergic rhinitis due to pollen: Secondary | ICD-10-CM | POA: Diagnosis not present

## 2024-05-23 DIAGNOSIS — J3081 Allergic rhinitis due to animal (cat) (dog) hair and dander: Secondary | ICD-10-CM | POA: Diagnosis not present

## 2024-05-23 DIAGNOSIS — J3089 Other allergic rhinitis: Secondary | ICD-10-CM | POA: Diagnosis not present

## 2024-05-26 ENCOUNTER — Ambulatory Visit

## 2024-05-26 DIAGNOSIS — M5459 Other low back pain: Secondary | ICD-10-CM

## 2024-05-26 DIAGNOSIS — M546 Pain in thoracic spine: Secondary | ICD-10-CM

## 2024-05-26 DIAGNOSIS — M6281 Muscle weakness (generalized): Secondary | ICD-10-CM

## 2024-05-26 NOTE — Therapy (Signed)
 OUTPATIENT PHYSICAL THERAPY THORACOLUMBAR TREATMENT    Patient Name: Dana Hensley MRN: [id number] DOB:[birth year], 63 y.o., female Today's Date: 05/26/2024  END OF SESSION:  PT End of Session - 05/26/24 0945     Visit Number 11    Number of Visits 25    Date for PT Re-Evaluation 06/23/24    Authorization Type [employer] auth#[id number] for total 12 PT    Authorization Time Period 5/13-9/10    Authorization - Visit Number 10    Authorization - Number of Visits 12    PT Start Time 0945    PT Stop Time 1025    PT Time Calculation (min) 40 min    Activity Tolerance Patient tolerated treatment well    Behavior During Therapy WFL for tasks assessed/performed            Past Medical History:  Diagnosis Date   Allergy    Anxiety    Asthma    B12 deficiency    Barrett esophagus    Chronic atrophic gastritis    DVT, lower extremity (HCC)    Gastric polyps    HLD (hyperlipidemia)    Hypothyroidism    IGT (impaired glucose tolerance)    Neuroendocrine tumor    carcinoid tumors of the stomach, s/p extraction surgery at [facility]   PONV (postoperative nausea and vomiting)    Vitamin D  deficiency    Past Surgical History:  Procedure Laterality Date   APPENDECTOMY     CHOLECYSTECTOMY N/A 11/16/2016   Procedure: LAPAROSCOPIC CHOLECYSTECTOMY WITH INTRAOPERATIVE CHOLANGIOGRAM;  Surgeon: Larinda Unknown Sharps, MD;  Location: [facility] ORS;  Service: General;  Laterality: N/A;   COLONOSCOPY WITH PROPOFOL  N/A 04/21/2021   Procedure: COLONOSCOPY WITH PROPOFOL ;  Surgeon: Jinny Carmine, MD;  Location: [facility] ENDOSCOPY;  Service: Endoscopy;  Laterality: N/A;   DIAGNOSTIC LAPAROSCOPY     ESOPHAGOGASTRODUODENOSCOPY     TONSILLECTOMY     TOTAL ABDOMINAL HYSTERECTOMY W/ BILATERAL SALPINGOOPHORECTOMY  2012   due to ovar cysts/pain   VEIN LIGATION AND STRIPPING     Patient Active Problem List   Diagnosis Date Noted   Leg pain 02/04/2024   Other chest pain 08/02/2021   Chest pain 08/02/2021   Hx  of colonic polyps    Hyperlipidemia, unspecified 08/29/2018   IGT (impaired glucose tolerance) 08/29/2018   Intrinsic atopic dermatitis 08/29/2018   History of adenomatous polyp of colon 04/01/2018   Elevated liver function tests 01/18/2018   Weight loss 01/15/2017   Pain in limb 12/18/2016   Calculus of gallbladder without cholecystitis without obstruction 10/23/2016   Chronic venous insufficiency 10/16/2016   Varicose veins of both lower extremities with pain 10/16/2016   Lymphedema 10/16/2016   Epigastric pain 10/03/2016   Barrett's esophagus determined by endoscopy 01/25/2016   Vitamin B12 deficiency 10/18/2015   Chronic neck and back pain 08/19/2015   Allergic rhinitis with postnasal drip 07/09/2015   Dyslipidemia 05/05/2015   Acquired hypothyroidism 05/05/2015   Carcinoid tumor 05/05/2015   History of malignant carcinoid tumor of stomach 03/26/2012    PCP: Edman Marsa PARAS, DO  REFERRING PROVIDER: Ulis Bottcher, PA-C  REFERRING DIAG: R20.0,R20.2 (ICD-10-CM) - Numbness and tingling of both legs M54.2 (ICD-10-CM) - Neck pain G89.29,M54.42 (ICD-10-CM) - Chronic bilateral low back pain with left-sided sciatica  RATIONALE FOR EVALUATION AND TREATMENT: Rehabilitation  THERAPY DIAG: No diagnosis found.  ONSET DATE: Chronic (~20 years)   FOLLOW-UP APPT SCHEDULED WITH REFERRING PROVIDER: No    SUBJECTIVE:  SUBJECTIVE STATEMENT:  Chief Concern of Neck Pain and low back pain   PERTINENT HISTORY: Patient reports to OPPT with a chief complaint of neck and back pain.  Patient has been experiencing chronic LBP for ~20 yrs but symptoms have worsened in the last year. She reports termination from her job in 2023 and within the last year she has been helping her son with care taking of her 2  grandchildren. Her son lives in a multistory home and she constantly has to perform stair navigation (15 stairs).  She reports increase in activities within the last year with care taking for her family and believes that the increase of activities has aggravated her back. Patient describes that currently her pain is located along her mid thoracic spine and radiates to her lumbar spine. Additionally, Pt reports that pain in her lumbar spine can radiates down the L posterior thigh and into the bottom of her foot. She describes the pain as sharp along the L leg and reports numbness and tingling in both feet. However she does have baseline neuropathy in her feet. She has occasional muscle spasms in both calves with increased activity throughout the day. She reports that she cannot take NSAIDS due to gastric conditions.  She denies changes to b/b, saddle anasthesia, abdominal pain, chills/fever, night sweats, nausea, vomiting,   Dominant hand: right  Imaging: Yes  CLINICAL DATA:  Chronic low back pain EXAM: LUMBAR SPINE - COMPLETE 4+ VIEW COMPARISON:  CT abdomen and pelvis 06/14/2017= FINDINGS: Normal anatomic alignment. No evidence for acute fracture or dislocation. Preservation of the vertebral body heights. Mild L5-S1 degenerative disc disease. Lower lumbar spine facet degenerative changes. Stool throughout the colon. Cholecystectomy clips. SI joints unremarkable. IMPRESSION: Lower lumbar spine degenerative disc and facet disease.  Electronically Signed   By: Bard Moats M.D.   On: 02/07/2024 23:18   PAIN:    Pain Intensity: Present: 6/10, Best: 3-4/10, Worst: 7-8/10 Pain location: Thoracic to Lumbar  Pain Quality: constant  Radiating: Yes  Focal Weakness: Yes Aggravating factors: Lifting, Bending, Kneeling Position Relieving factors: Walking and rest, lidocaine  patches 24-hour pain behavior: Activity Dependent, worse in the evening How long can you sit: 15-20 How long can you  stand: History of prior back injury, pain, surgery, or therapy: Yes  PRECAUTIONS: Fall  WEIGHT BEARING RESTRICTIONS: No  FALLS: Has patient fallen in last 6 months? No  Living Environment Lives with: lives with their family Lives in: House/apartment Stairs: No Has following equipment at home: None  Prior level of function: Independent  Occupational demands: Unemployed   Hobbies: Garden, Taking Care of [place] children, walking the dog   Patient Goals: To have no pain in my feet and back    OBJECTIVE:  Patient Surveys  Modified Oswestry 18 / 50 = 36.0 %    Cognition Patient is oriented to person, place, and time.  Recent memory is intact.  Remote memory is intact.  Attention span and concentration are intact.  Expressive speech is intact.  Patient's fund of knowledge is within normal limits for educational level.    Gross Musculoskeletal Assessment Tremor: None Bulk: Normal Tone: Normal  GAIT: Distance walked: 10 Assistive device utilized: None Level of assistance: Complete Independence Comments: Reciprocal WNL  Posture (Seated): Increased Thoracic Kyphosis Lumbar lordosis: WNL  AROM AROM (Normal range in degrees) AROM   Lumbar   Flexion (65) 100%  Extension (30) 100%  Right lateral flexion (25) 80% *   Left lateral flexion (25) 80%*  Right rotation (30) 100%  Left rotation (30) 100%      Hip Right Left  Flexion (125) WNL  WNL  Extension (15)    Abduction (40)    Adduction     Internal Rotation (45) 32 36  External Rotation (45)        Knee    Flexion (135)    Extension (0)        Ankle    Dorsiflexion (20)    Plantarflexion (50)    Inversion (35)    Eversion (15)    (* = pain; Blank rows = not tested)  LE MMT: MMT (out of 5) Right  Left   Hip flexion 4- 4-  Hip extension    Hip abduction 4- 4-  Hip adduction    Hip internal rotation    Hip external rotation    Knee flexion 4 4  Knee extension 4 4  Ankle dorsiflexion 5 5   Ankle plantarflexion 5 5  Ankle inversion    Ankle eversion    (* = pain; Blank rows = not tested)  Sensation Grossly intact to light touch throughout bilateral LEs as determined by testing dermatomes L2-S2. Proprioception, stereognosis, and hot/cold testing deferred on this date.  Reflexes Deferred   Muscle Length Hamstrings: R: Negative L: Negative  Palpation Location Right Left         Lumbar paraspinals 0 0  Quadratus Lumborum    Gluteus Maximus 0 0  Gluteus Medius 1 1  Deep hip external rotators 2 2  PSIS    Fortin's Area (SIJ)    Greater Trochanter    Thoracic Paraspinals (T7-T12)  1 1  Rhomboids  1  1  (Blank rows = not tested) Graded on 0-4 scale (0 = no pain, 1 = pain, 2 = pain with wincing/grimacing/flinching, 3 = pain with withdrawal, 4 = unwilling to allow palpation)  Passive Accessory Intervertebral Motion Pt with reproduction of back pain with CPA L1-L5 and bilaterally T6-T12. Generally, hypomobile throughout  Special Tests Lumbar Radiculopathy and Discogenic: Slump (SN 83, -LR 0.32): R: Deferred   L: Negative   SLR (SN 92, -LR 0.29): R: Deferred L:  Deferred  Crossed SLR (SP 90): R: Deferred   L: Deferred   Facet Joint: Extension-Rotation (SN 100, -LR 0.0): R: Negative L: Negative  Hip: FABER (SN 81): R: Negative L: Negative FADIR (SN 94): R: Negative L: Negative Hip scour (SN 50): R:  L:    Functional Tasks Deep squat: Narrow BoS, limited Ankle DF  Forward Step-Down Test: R: Hip IR/ADD L:  Lateral Step-Down Test: R: Hip IR/ADD L:   30s Sit to Stand: 16 reps  TODAY'S TREATMENT: DATE: 05/26/2024  Subjective: Patient reports 3-4/10 NPS in mid back 3-4/10 in the lower back. Continued neuropathic pain bilateral feet which is why she cancelled last appt. F/u with referring provider went well and she reports she is satisfied with progress in her lower mid back. Pt reports that she is able to perform Open book exercise with increased thoracic  rotation. No questions or concerns.   Therapeutic Exercise:  Sidelying Open Book Thoracic Lumbar Rotation and Extension  R/L: 2 x 10 ea side   Hooklying Open Book Thoracic Rotation   1 x 20, 10 ea side  OMEGA Cable   Lat Pull Down    1 x 12, 25#    2 x 12, 35#   Therapeutic Activity:   Single Leg Bridge for core stabilization and glute strengthening  R/L: 2 x 10 ea    Forward Step Down from 8 Stool in order to improve capacity to navigate son's steps    R/L: 2 x 10    Forward Step Up onto 8 Stool with Tidal Tank (20#) in order to improve core stabilization and LE strength    R/L: 2 x 10   Standing Med [employer]    L: 1 x 10 15# MB   R: 1 x 10 15# MB    Med Ball Throw    Throw to the R: 1 x 10, 3 Kg MB    Throw to the L: 1 x 10, 3 Kg MB   PATIENT EDUCATION:  Education details: HEP, Exercise Technique  Person educated: Patient Education method: Explanation, Demonstration, and Handouts Education comprehension: verbalized understanding and returned demonstration   HOME EXERCISE PROGRAM:   Access Code: [email or web address] URL: [email or web address] Date: 05/07/2024 Prepared by: Lonni Shakeila Pfarr  Exercises - Supine Figure 4 Piriformis Stretch  - 2 x daily - 7 x weekly - 3 sets - 30 hold - Child's Pose Stretch  - 2 x daily - 7 x weekly - 3 sets - 30 hold - Seated Hamstring Stretch  - 2 x daily - 7 x weekly - 3 sets - 30-60s hold - Supine Lower Trunk Rotation  - 1 x daily - 7 x weekly - 2-3 sets - 10-2 reps - Sidelying Open Book Thoracic Lumbar Rotation and Extension  - 1 x daily - 7 x weekly - 2-3 sets - 10-12 reps - Quadruped Thoracic Rotation - Reach Under  - 1 x daily - 7 x weekly - 3 sets - 10-12 reps - Supine Bridge  - 1 x daily - 7 x weekly - 3 sets - 10 reps - Squat with Resistance at Thighs  - 1 x daily - 3-4 x weekly - 2-3 sets - 10-12 reps - Forward Step Up  - 1 x daily - 3-4 x weekly - 2 sets - 10 reps - Forward Step Down Touch with Heel  -  1 x daily - 3-4 x weekly - 2 sets - 10 reps  Access Code: [email or web address] URL: [email or web address] Date: 04/08/2024 Prepared by: Lonni Merrill Villarruel  Exercises - Supine Figure 4 Piriformis Stretch  - 2 x daily - 7 x weekly - 3 sets - 30 hold - Child's Pose Stretch  - 2 x daily - 7 x weekly - 3 sets - 30 hold - Seated Hamstring Stretch  - 2 x daily - 7 x weekly - 3 sets - 30-60s hold - Supine Lower Trunk Rotation  - 1 x daily - 7 x weekly - 2-3 sets - 10-2 reps - Sidelying Open Book Thoracic Lumbar Rotation and Extension  - 1 x daily - 7 x weekly - 2-3 sets - 10-12 reps - Quadruped Thoracic Rotation - Reach Under  - 1 x daily - 7 x weekly - 3 sets - 10-12 reps - Supine Bridge  - 1 x daily - 7 x weekly - 3 sets - 10 reps - Squat with Resistance at Thighs  - 1 x daily - 3-4 x weekly - 2-3 sets - 10-12 reps   ASSESSMENT:  CLINICAL IMPRESSION: Continued PT POC with a main focus in management of mid/lower back pain. Patient continues to demonstrate improvements with core stabilization while performing dynamic movements (I.e. rotations). She still has minor knee pain with 8 step down but has demonstrated strength as she was able to perform increased  capacity. Neuropathic foot symptoms remain but were not exacerbated by participation. Discussed current POC and remaining visits; PT to focus on functional strengthening and improving thoracolumbar mobility. Current limitations still include decreased activity tolerance, intermittent pain and stiffness along mid to lower back limiting full participation with prolonged household ADLs and recreational activities. Based on today's performance, PT strongly recommends continued skilled physical therapy in order facilitate return to PLOF and improved QoL.   OBJECTIVE IMPAIRMENTS: decreased activity tolerance, decreased strength, hypomobility, impaired flexibility, and pain.   ACTIVITY LIMITATIONS: carrying, lifting, bending, squatting, sleeping,  transfers, and caring for others  PARTICIPATION LIMITATIONS: yard work  PERSONAL FACTORS: Age, Behavior pattern, Past/current experiences, and Time since onset of injury/illness/exacerbation are also affecting patient's functional outcome.   REHAB POTENTIAL: Good  CLINICAL DECISION MAKING: Evolving/moderate complexity  EVALUATION COMPLEXITY: Moderate   GOALS: Goals reviewed with patient? No  SHORT TERM GOALS: Target date: 07/07/2024  Pt will be independent with HEP in order to improve strength and decrease back pain to improve pain-free function at home and work. Baseline: 03/31/2024: Initial Provided; 05/12/24: Pt endorses 100% adherence to HEP.  Goal status: GOAL MET    LONG TERM GOALS: Target date: 08/18/2024  Pt will demonstrate ability to navigate 15 steps on stairs pain free to demonstrate significant improvement in functional movements and pain.  Baseline: Navigated 16 steps (6) without rail support pain free Goal status: Goal MET  2.  Pt will decrease worst back pain by at least 2 points on the NPRS in order to demonstrate clinically significant reduction in back pain. Baseline: 03/31/2024: 8/10 NPRS; 05/12/2024: 6/10 (middle) 4/10 low back; 05/15/2024: 3/10 (mid/low back); 05/20/2024: 3-4/10 middle/low back  Goal status: Progressing      3.  Pt will decrease mODI score by at least 13 points in order demonstrate clinically significant reduction in back pain/disability.       Baseline: 03/31/2024:  18 / 50 = 36.0 % (50/50 Max Disability); 05/15/2024: 16 / 50 = 32.0 % Goal status: Progressing  4.  Pt will increase in R/L internal ROM to 45 in order to demonstrate increased tissue extensibility and decrease in pain. Baseline: 03/31/2024 Internal Rotation (45) R: 32 L: 36  05/15/2024 40 40   Goal status: Progressing  5.  Pt will increase 30s STS by 5 reps in order to demonstrate improvements in LE strength and endurance Baseline: 03/31/2024: 16 reps; 05/12/2024; 16  reps; 05/20/2024: 13 reps Goal status: Progressing   PLAN: PT FREQUENCY: 1-2x/week  PT DURATION: 12 weeks  PLANNED INTERVENTIONS: Therapeutic exercises, Therapeutic activity, Neuromuscular re-education, Balance training, Gait training, Patient/Family education, Self Care, Joint mobilization, Joint manipulation, Vestibular training, Canalith repositioning, Orthotic/Fit training, DME instructions, Dry Needling, Electrical stimulation, Spinal manipulation, Spinal mobilization, Cryotherapy, Moist heat, Taping, Traction, Ultrasound, Ionotophoresis 4mg /ml Dexamethasone , Manual therapy, and Re-evaluation.  PLAN FOR NEXT SESSION:  Progress gluteal and core strengthening, Progress thoracolumbar mobility, progress functional strengthening. Lifting mechanics.    Lonni Pall PT, DPT Physical Therapist- [facility]  05/26/2024, 9:48 AM

## 2024-05-27 DIAGNOSIS — J3081 Allergic rhinitis due to animal (cat) (dog) hair and dander: Secondary | ICD-10-CM | POA: Diagnosis not present

## 2024-05-27 DIAGNOSIS — J301 Allergic rhinitis due to pollen: Secondary | ICD-10-CM | POA: Diagnosis not present

## 2024-05-27 DIAGNOSIS — J3089 Other allergic rhinitis: Secondary | ICD-10-CM | POA: Diagnosis not present

## 2024-05-28 ENCOUNTER — Ambulatory Visit: Attending: Physician Assistant

## 2024-05-28 DIAGNOSIS — M6281 Muscle weakness (generalized): Secondary | ICD-10-CM | POA: Diagnosis not present

## 2024-05-28 DIAGNOSIS — M5459 Other low back pain: Secondary | ICD-10-CM | POA: Insufficient documentation

## 2024-05-28 DIAGNOSIS — M546 Pain in thoracic spine: Secondary | ICD-10-CM | POA: Insufficient documentation

## 2024-05-28 NOTE — Therapy (Signed)
 OUTPATIENT PHYSICAL THERAPY THORACOLUMBAR TREATMENT    Patient Name: Dana Hensley MRN: [id number] DOB:1961-10-03, 63 y.o., female Today's Date: 05/28/2024  END OF SESSION:  PT End of Session - 05/28/24 0816     Visit Number 12    Number of Visits 25    Date for PT Re-Evaluation 06/23/24    Authorization Type [employer] auth#[id number] for total 12 PT    Authorization Time Period 5/13-9/10    Authorization - Visit Number 11    Authorization - Number of Visits 12    PT Start Time 0815    PT Stop Time 0855    PT Time Calculation (min) 40 min    Activity Tolerance Patient tolerated treatment well    Behavior During Therapy WFL for tasks assessed/performed             Past Medical History:  Diagnosis Date   Allergy    Anxiety    Asthma    B12 deficiency    Barrett esophagus    Chronic atrophic gastritis    DVT, lower extremity (HCC)    Gastric polyps    HLD (hyperlipidemia)    Hypothyroidism    IGT (impaired glucose tolerance)    Neuroendocrine tumor    carcinoid tumors of the stomach, s/p extraction surgery at [facility]   PONV (postoperative nausea and vomiting)    Vitamin D  deficiency    Past Surgical History:  Procedure Laterality Date   APPENDECTOMY     CHOLECYSTECTOMY N/A 11/16/2016   Procedure: LAPAROSCOPIC CHOLECYSTECTOMY WITH INTRAOPERATIVE CHOLANGIOGRAM;  Surgeon: Larinda Unknown Sharps, MD;  Location: [facility] ORS;  Service: General;  Laterality: N/A;   COLONOSCOPY WITH PROPOFOL  N/A 04/21/2021   Procedure: COLONOSCOPY WITH PROPOFOL ;  Surgeon: Jinny Carmine, MD;  Location: [facility] ENDOSCOPY;  Service: Endoscopy;  Laterality: N/A;   DIAGNOSTIC LAPAROSCOPY     ESOPHAGOGASTRODUODENOSCOPY     TONSILLECTOMY     TOTAL ABDOMINAL HYSTERECTOMY W/ BILATERAL SALPINGOOPHORECTOMY  2012   due to ovar cysts/pain   VEIN LIGATION AND STRIPPING     Patient Active Problem List   Diagnosis Date Noted   Leg pain 02/04/2024   Other chest pain 08/02/2021   Chest pain 08/02/2021   Hx  of colonic polyps    Hyperlipidemia, unspecified 08/29/2018   IGT (impaired glucose tolerance) 08/29/2018   Intrinsic atopic dermatitis 08/29/2018   History of adenomatous polyp of colon 04/01/2018   Elevated liver function tests 01/18/2018   Weight loss 01/15/2017   Pain in limb 12/18/2016   Calculus of gallbladder without cholecystitis without obstruction 10/23/2016   Chronic venous insufficiency 10/16/2016   Varicose veins of both lower extremities with pain 10/16/2016   Lymphedema 10/16/2016   Epigastric pain 10/03/2016   Barrett's esophagus determined by endoscopy 01/25/2016   Vitamin B12 deficiency 10/18/2015   Chronic neck and back pain 08/19/2015   Allergic rhinitis with postnasal drip 07/09/2015   Dyslipidemia 05/05/2015   Acquired hypothyroidism 05/05/2015   Carcinoid tumor 05/05/2015   History of malignant carcinoid tumor of stomach 03/26/2012    PCP: Edman Marsa PARAS, DO  REFERRING PROVIDER: Ulis Bottcher, PA-C  REFERRING DIAG: R20.0,R20.2 (ICD-10-CM) - Numbness and tingling of both legs M54.2 (ICD-10-CM) - Neck pain G89.29,M54.42 (ICD-10-CM) - Chronic bilateral low back pain with left-sided sciatica  RATIONALE FOR EVALUATION AND TREATMENT: Rehabilitation  THERAPY DIAG: Other low back pain  Muscle weakness (generalized)  Pain in thoracic spine  ONSET DATE: Chronic (~20 years)   FOLLOW-UP APPT SCHEDULED WITH REFERRING PROVIDER: No  SUBJECTIVE:                                                                                                                                                                                         SUBJECTIVE STATEMENT:  Chief Concern of Neck Pain and low back pain   PERTINENT HISTORY: Patient reports to OPPT with a chief complaint of neck and back pain.  Patient has been experiencing chronic LBP for ~20 yrs but symptoms have worsened in the last year. She reports termination from her job in 2023 and within the last year  she has been helping her son with care taking of her 2 grandchildren. Her son lives in a multistory home and she constantly has to perform stair navigation (15 stairs).  She reports increase in activities within the last year with care taking for her family and believes that the increase of activities has aggravated her back. Patient describes that currently her pain is located along her mid thoracic spine and radiates to her lumbar spine. Additionally, Pt reports that pain in her lumbar spine can radiates down the L posterior thigh and into the bottom of her foot. She describes the pain as sharp along the L leg and reports numbness and tingling in both feet. However she does have baseline neuropathy in her feet. She has occasional muscle spasms in both calves with increased activity throughout the day. She reports that she cannot take NSAIDS due to gastric conditions.  She denies changes to b/b, saddle anasthesia, abdominal pain, chills/fever, night sweats, nausea, vomiting,   Dominant hand: right  Imaging: Yes  CLINICAL DATA:  Chronic low back pain EXAM: LUMBAR SPINE - COMPLETE 4+ VIEW COMPARISON:  CT abdomen and pelvis 06/14/2017= FINDINGS: Normal anatomic alignment. No evidence for acute fracture or dislocation. Preservation of the vertebral body heights. Mild L5-S1 degenerative disc disease. Lower lumbar spine facet degenerative changes. Stool throughout the colon. Cholecystectomy clips. SI joints unremarkable. IMPRESSION: Lower lumbar spine degenerative disc and facet disease.  Electronically Signed   By: Bard Moats M.D.   On: 02/07/2024 23:18   PAIN:    Pain Intensity: Present: 6/10, Best: 3-4/10, Worst: 7-8/10 Pain location: Thoracic to Lumbar  Pain Quality: constant  Radiating: Yes  Focal Weakness: Yes Aggravating factors: Lifting, Bending, Kneeling Position Relieving factors: Walking and rest, lidocaine  patches 24-hour pain behavior: Activity Dependent, worse in the  evening How long can you sit: 15-20 How long can you stand: History of prior back injury, pain, surgery, or therapy: Yes  PRECAUTIONS: Fall  WEIGHT BEARING RESTRICTIONS: No  FALLS: Has patient fallen in last 6 months? No  Living Environment Lives with:  lives with their family Lives in: House/apartment Stairs: No Has following equipment at home: None  Prior level of function: Independent  Occupational demands: Unemployed   Hobbies: Garden, Taking Care of [place] children, walking the dog   Patient Goals: To have no pain in my feet and back    OBJECTIVE:  Patient Surveys  Modified Oswestry 18 / 50 = 36.0 %    Cognition Patient is oriented to person, place, and time.  Recent memory is intact.  Remote memory is intact.  Attention span and concentration are intact.  Expressive speech is intact.  Patient's fund of knowledge is within normal limits for educational level.    Gross Musculoskeletal Assessment Tremor: None Bulk: Normal Tone: Normal  GAIT: Distance walked: 10 Assistive device utilized: None Level of assistance: Complete Independence Comments: Reciprocal WNL  Posture (Seated): Increased Thoracic Kyphosis Lumbar lordosis: WNL  AROM AROM (Normal range in degrees) AROM   Lumbar   Flexion (65) 100%  Extension (30) 100%  Right lateral flexion (25) 80% *   Left lateral flexion (25) 80%*   Right rotation (30) 100%  Left rotation (30) 100%      Hip Right Left  Flexion (125) WNL  WNL  Extension (15)    Abduction (40)    Adduction     Internal Rotation (45) 32 36  External Rotation (45)        Knee    Flexion (135)    Extension (0)        Ankle    Dorsiflexion (20)    Plantarflexion (50)    Inversion (35)    Eversion (15)    (* = pain; Blank rows = not tested)  LE MMT: MMT (out of 5) Right  Left   Hip flexion 4- 4-  Hip extension    Hip abduction 4- 4-  Hip adduction    Hip internal rotation    Hip external rotation    Knee  flexion 4 4  Knee extension 4 4  Ankle dorsiflexion 5 5  Ankle plantarflexion 5 5  Ankle inversion    Ankle eversion    (* = pain; Blank rows = not tested)  Sensation Grossly intact to light touch throughout bilateral LEs as determined by testing dermatomes L2-S2. Proprioception, stereognosis, and hot/cold testing deferred on this date.  Reflexes Deferred   Muscle Length Hamstrings: R: Negative L: Negative  Palpation Location Right Left         Lumbar paraspinals 0 0  Quadratus Lumborum    Gluteus Maximus 0 0  Gluteus Medius 1 1  Deep hip external rotators 2 2  PSIS    Fortin's Area (SIJ)    Greater Trochanter    Thoracic Paraspinals (T7-T12)  1 1  Rhomboids  1  1  (Blank rows = not tested) Graded on 0-4 scale (0 = no pain, 1 = pain, 2 = pain with wincing/grimacing/flinching, 3 = pain with withdrawal, 4 = unwilling to allow palpation)  Passive Accessory Intervertebral Motion Pt with reproduction of back pain with CPA L1-L5 and bilaterally T6-T12. Generally, hypomobile throughout  Special Tests Lumbar Radiculopathy and Discogenic: Slump (SN 83, -LR 0.32): R: Deferred   L: Negative   SLR (SN 92, -LR 0.29): R: Deferred L:  Deferred  Crossed SLR (SP 90): R: Deferred   L: Deferred   Facet Joint: Extension-Rotation (SN 100, -LR 0.0): R: Negative L: Negative  Hip: FABER (SN 81): R: Negative L: Negative FADIR (SN 94): R: Negative L:  Negative Hip scour (SN 50): R:  L:    Functional Tasks Deep squat: Narrow BoS, limited Ankle DF  Forward Step-Down Test: R: Hip IR/ADD L:  Lateral Step-Down Test: R: Hip IR/ADD L:   30s Sit to Stand: 16 reps  TODAY'S TREATMENT: DATE: 05/26/2024  Subjective: Patient reports 2-3/10 NPS in the middle and lower back. NO soreness following last PT session. She is agreeable to d/c in the next appt.  No questions or concerns.   Therapeutic Exercise:  NuStep L4-1 x 3 min x UE/LE (Seat 7) for LE warm up, endurance and strength; PT manually  adjusted resistance throughout bout.   Hip Matrix Cable Machine  Hip Abduction   R/L: 3 x 10, 25#   Supine Modified Jack Knife Curl   3 x 10, 8# Weighted Dowel for UE   Quadruped Thoracic Rotation - Reach Under with Foam roller assist  R/L: 1 x 10 reps   Therapeutic Activity (focused on core stabilization, functional strengthening in order to optimize household ADLs):   Lateral Step Down from 6   R/L: 2 x 10 ea leg, SUE Support  Decline Ramp Squat   2 x 8, No UE Support   Barbell Squat   1 x 5, Neck pain from the barbell pad, direct pressure. Relieved with cessation of exercise  Kettle Bell Squat   1 x 5, 30#   1 x 10, 30#   Side Plank with Clamshell   2 x 8, Red TB   Bear Plank  for LE and core stabilization   3 x 10s   Single Leg Stance with Tital Tank in BUE  R/L Lateral tilt with Tidal Tank x 10 ea Leg   PATIENT EDUCATION:  Education details: HEP, Exercise Technique  Person educated: Patient Education method: Explanation, Demonstration, and Handouts Education comprehension: verbalized understanding and returned demonstration   HOME EXERCISE PROGRAM:   Access Code: [email or web address] URL: [email or web address] Date: 05/07/2024 Prepared by: Lonni Tunis Gentle  Exercises - Supine Figure 4 Piriformis Stretch  - 2 x daily - 7 x weekly - 3 sets - 30 hold - Child's Pose Stretch  - 2 x daily - 7 x weekly - 3 sets - 30 hold - Seated Hamstring Stretch  - 2 x daily - 7 x weekly - 3 sets - 30-60s hold - Supine Lower Trunk Rotation  - 1 x daily - 7 x weekly - 2-3 sets - 10-2 reps - Sidelying Open Book Thoracic Lumbar Rotation and Extension  - 1 x daily - 7 x weekly - 2-3 sets - 10-12 reps - Quadruped Thoracic Rotation - Reach Under  - 1 x daily - 7 x weekly - 3 sets - 10-12 reps - Supine Bridge  - 1 x daily - 7 x weekly - 3 sets - 10 reps - Squat with Resistance at Thighs  - 1 x daily - 3-4 x weekly - 2-3 sets - 10-12 reps - Forward Step Up  - 1 x daily - 3-4 x  weekly - 2 sets - 10 reps - Forward Step Down Touch with Heel  - 1 x daily - 3-4 x weekly - 2 sets - 10 reps  Access Code: [email or web address] URL: [email or web address] Date: 04/08/2024 Prepared by: Lonni Haruka Kowaleski  Exercises - Supine Figure 4 Piriformis Stretch  - 2 x daily - 7 x weekly - 3 sets - 30 hold - Child's Pose Stretch  - 2 x daily - 7 x weekly - 3 sets - 30 hold -  Seated Hamstring Stretch  - 2 x daily - 7 x weekly - 3 sets - 30-60s hold - Supine Lower Trunk Rotation  - 1 x daily - 7 x weekly - 2-3 sets - 10-2 reps - Sidelying Open Book Thoracic Lumbar Rotation and Extension  - 1 x daily - 7 x weekly - 2-3 sets - 10-12 reps - Quadruped Thoracic Rotation - Reach Under  - 1 x daily - 7 x weekly - 3 sets - 10-12 reps - Supine Bridge  - 1 x daily - 7 x weekly - 3 sets - 10 reps - Squat with Resistance at Thighs  - 1 x daily - 3-4 x weekly - 2-3 sets - 10-12 reps   ASSESSMENT:  CLINICAL IMPRESSION: Continued PT POC with a main focus in management of mid/lower back pain. PT increased resistance and intensity with hip strengthening exercises. Pt tolerated all exercises without exacerbation in lower back pain. Pt demonstrated improved eccentric control of quadricep muscles with descending from 6 step and decline ramp squat. Patient continues to remain active outside of PT sessions and endorses improvements in mid low back pain. End of session pt endorsed minor improvements in foot pain; PT suspects neuropathic pain to be nerve root related. PT plans to d/c patient from OPPT in following session and reassess progress towards PT goals.   Based on today's performance, PT strongly recommends continued skilled physical therapy in order facilitate return to PLOF and improved QoL.   OBJECTIVE IMPAIRMENTS: decreased activity tolerance, decreased strength, hypomobility, impaired flexibility, and pain.   ACTIVITY LIMITATIONS: carrying, lifting, bending, squatting, sleeping, transfers, and  caring for others  PARTICIPATION LIMITATIONS: yard work  PERSONAL FACTORS: Age, Behavior pattern, Past/current experiences, and Time since onset of injury/illness/exacerbation are also affecting patient's functional outcome.   REHAB POTENTIAL: Good  CLINICAL DECISION MAKING: Evolving/moderate complexity  EVALUATION COMPLEXITY: Moderate   GOALS: Goals reviewed with patient? No  SHORT TERM GOALS: Target date: 07/09/2024  Pt will be independent with HEP in order to improve strength and decrease back pain to improve pain-free function at home and work. Baseline: 03/31/2024: Initial Provided; 05/12/24: Pt endorses 100% adherence to HEP.  Goal status: GOAL MET    LONG TERM GOALS: Target date: 08/20/2024  Pt will demonstrate ability to navigate 15 steps on stairs pain free to demonstrate significant improvement in functional movements and pain.  Baseline: Navigated 16 steps (6) without rail support pain free Goal status: Goal MET  2.  Pt will decrease worst back pain by at least 2 points on the NPRS in order to demonstrate clinically significant reduction in back pain. Baseline: 03/31/2024: 8/10 NPRS; 05/12/2024: 6/10 (middle) 4/10 low back; 05/15/2024: 3/10 (mid/low back); 05/20/2024: 3-4/10 middle/low back  Goal status: Progressing      3.  Pt will decrease mODI score by at least 13 points in order demonstrate clinically significant reduction in back pain/disability.       Baseline: 03/31/2024:  18 / 50 = 36.0 % (50/50 Max Disability); 05/15/2024: 16 / 50 = 32.0 % Goal status: Progressing  4.  Pt will increase in R/L internal ROM to 45 in order to demonstrate increased tissue extensibility and decrease in pain. Baseline: 03/31/2024 Internal Rotation (45) R: 32 L: 36  05/15/2024 40 40   Goal status: Progressing  5.  Pt will increase 30s STS by 5 reps in order to demonstrate improvements in LE strength and endurance Baseline: 03/31/2024: 16 reps; 05/12/2024; 16 reps;  05/20/2024: 13 reps Goal status:  Progressing   PLAN: PT FREQUENCY: 1-2x/week  PT DURATION: 12 weeks  PLANNED INTERVENTIONS: Therapeutic exercises, Therapeutic activity, Neuromuscular re-education, Balance training, Gait training, Patient/Family education, Self Care, Joint mobilization, Joint manipulation, Vestibular training, Canalith repositioning, Orthotic/Fit training, DME instructions, Dry Needling, Electrical stimulation, Spinal manipulation, Spinal mobilization, Cryotherapy, Moist heat, Taping, Traction, Ultrasound, Ionotophoresis 4mg /ml Dexamethasone , Manual therapy, and Re-evaluation.  PLAN FOR NEXT SESSION:  Progress gluteal and core strengthening, Progress thoracolumbar mobility, progress functional strengthening. Lifting mechanics.    Lonni Pall PT, DPT Physical Therapist- [facility]  05/28/2024, 8:18 AM

## 2024-06-02 ENCOUNTER — Other Ambulatory Visit: Payer: Self-pay | Admitting: Physician Assistant

## 2024-06-02 DIAGNOSIS — R202 Paresthesia of skin: Secondary | ICD-10-CM

## 2024-06-02 DIAGNOSIS — G8929 Other chronic pain: Secondary | ICD-10-CM

## 2024-06-03 ENCOUNTER — Ambulatory Visit

## 2024-06-03 DIAGNOSIS — M5459 Other low back pain: Secondary | ICD-10-CM | POA: Diagnosis not present

## 2024-06-03 DIAGNOSIS — M546 Pain in thoracic spine: Secondary | ICD-10-CM

## 2024-06-03 DIAGNOSIS — J3089 Other allergic rhinitis: Secondary | ICD-10-CM | POA: Diagnosis not present

## 2024-06-03 DIAGNOSIS — M6281 Muscle weakness (generalized): Secondary | ICD-10-CM

## 2024-06-03 DIAGNOSIS — J3081 Allergic rhinitis due to animal (cat) (dog) hair and dander: Secondary | ICD-10-CM | POA: Diagnosis not present

## 2024-06-03 DIAGNOSIS — J301 Allergic rhinitis due to pollen: Secondary | ICD-10-CM | POA: Diagnosis not present

## 2024-06-03 NOTE — Therapy (Addendum)
 OUTPATIENT PHYSICAL THERAPY THORACOLUMBAR TREATMENT/DISCHARGE SUMMARY    Patient Name: Dana Hensley MRN: [id number] DOB:1961-06-26, 63 y.o., female Today's Date: 06/03/2024  END OF SESSION:  PT End of Session - 06/03/24 0818     Visit Number 13    Number of Visits 25    Date for PT Re-Evaluation 06/23/24    Authorization Type [employer] auth#[id number] for total 12 PT    Authorization Time Period 5/13-9/10    Authorization - Visit Number 12    Authorization - Number of Visits 12    PT Start Time 0815    PT Stop Time 0855    PT Time Calculation (min) 40 min    Activity Tolerance Patient tolerated treatment well    Behavior During Therapy WFL for tasks assessed/performed             Past Medical History:  Diagnosis Date   Allergy    Anxiety    Asthma    B12 deficiency    Barrett esophagus    Chronic atrophic gastritis    DVT, lower extremity (HCC)    Gastric polyps    HLD (hyperlipidemia)    Hypothyroidism    IGT (impaired glucose tolerance)    Neuroendocrine tumor    carcinoid tumors of the stomach, s/p extraction surgery at [facility]   PONV (postoperative nausea and vomiting)    Vitamin D  deficiency    Past Surgical History:  Procedure Laterality Date   APPENDECTOMY     CHOLECYSTECTOMY N/A 11/16/2016   Procedure: LAPAROSCOPIC CHOLECYSTECTOMY WITH INTRAOPERATIVE CHOLANGIOGRAM;  Surgeon: Larinda Unknown Sharps, MD;  Location: [facility] ORS;  Service: General;  Laterality: N/A;   COLONOSCOPY WITH PROPOFOL  N/A 04/21/2021   Procedure: COLONOSCOPY WITH PROPOFOL ;  Surgeon: Jinny Carmine, MD;  Location: [facility] ENDOSCOPY;  Service: Endoscopy;  Laterality: N/A;   DIAGNOSTIC LAPAROSCOPY     ESOPHAGOGASTRODUODENOSCOPY     TONSILLECTOMY     TOTAL ABDOMINAL HYSTERECTOMY W/ BILATERAL SALPINGOOPHORECTOMY  2012   due to ovar cysts/pain   VEIN LIGATION AND STRIPPING     Patient Active Problem List   Diagnosis Date Noted   Leg pain 02/04/2024   Other chest pain 08/02/2021   Chest  pain 08/02/2021   Hx of colonic polyps    Hyperlipidemia, unspecified 08/29/2018   IGT (impaired glucose tolerance) 08/29/2018   Intrinsic atopic dermatitis 08/29/2018   History of adenomatous polyp of colon 04/01/2018   Elevated liver function tests 01/18/2018   Weight loss 01/15/2017   Pain in limb 12/18/2016   Calculus of gallbladder without cholecystitis without obstruction 10/23/2016   Chronic venous insufficiency 10/16/2016   Varicose veins of both lower extremities with pain 10/16/2016   Lymphedema 10/16/2016   Epigastric pain 10/03/2016   Barrett's esophagus determined by endoscopy 01/25/2016   Vitamin B12 deficiency 10/18/2015   Chronic neck and back pain 08/19/2015   Allergic rhinitis with postnasal drip 07/09/2015   Dyslipidemia 05/05/2015   Acquired hypothyroidism 05/05/2015   Carcinoid tumor 05/05/2015   History of malignant carcinoid tumor of stomach 03/26/2012    PCP: Edman Marsa PARAS, DO  REFERRING PROVIDER: Ulis Bottcher, PA-C  REFERRING DIAG: R20.0,R20.2 (ICD-10-CM) - Numbness and tingling of both legs M54.2 (ICD-10-CM) - Neck pain G89.29,M54.42 (ICD-10-CM) - Chronic bilateral low back pain with left-sided sciatica  RATIONALE FOR EVALUATION AND TREATMENT: Rehabilitation  THERAPY DIAG: Other low back pain  Muscle weakness (generalized)  Pain in thoracic spine  ONSET DATE: Chronic (~20 years)   FOLLOW-UP APPT SCHEDULED WITH REFERRING PROVIDER: No  SUBJECTIVE:                                                                                                                                                                                         SUBJECTIVE STATEMENT:  Chief Concern of Neck Pain and low back pain   PERTINENT HISTORY: Patient reports to OPPT with a chief complaint of neck and back pain.  Patient has been experiencing chronic LBP for ~20 yrs but symptoms have worsened in the last year. She reports termination from her job in 2023  and within the last year she has been helping her son with care taking of her 2 grandchildren. Her son lives in a multistory home and she constantly has to perform stair navigation (15 stairs).  She reports increase in activities within the last year with care taking for her family and believes that the increase of activities has aggravated her back. Patient describes that currently her pain is located along her mid thoracic spine and radiates to her lumbar spine. Additionally, Pt reports that pain in her lumbar spine can radiates down the L posterior thigh and into the bottom of her foot. She describes the pain as sharp along the L leg and reports numbness and tingling in both feet. However she does have baseline neuropathy in her feet. She has occasional muscle spasms in both calves with increased activity throughout the day. She reports that she cannot take NSAIDS due to gastric conditions.  She denies changes to b/b, saddle anasthesia, abdominal pain, chills/fever, night sweats, nausea, vomiting,   Dominant hand: right  Imaging: Yes  CLINICAL DATA:  Chronic low back pain EXAM: LUMBAR SPINE - COMPLETE 4+ VIEW COMPARISON:  CT abdomen and pelvis 06/14/2017= FINDINGS: Normal anatomic alignment. No evidence for acute fracture or dislocation. Preservation of the vertebral body heights. Mild L5-S1 degenerative disc disease. Lower lumbar spine facet degenerative changes. Stool throughout the colon. Cholecystectomy clips. SI joints unremarkable. IMPRESSION: Lower lumbar spine degenerative disc and facet disease.  Electronically Signed   By: Bard Moats M.D.   On: 02/07/2024 23:18   PAIN:    Pain Intensity: Present: 6/10, Best: 3-4/10, Worst: 7-8/10 Pain location: Thoracic to Lumbar  Pain Quality: constant  Radiating: Yes  Focal Weakness: Yes Aggravating factors: Lifting, Bending, Kneeling Position Relieving factors: Walking and rest, lidocaine  patches 24-hour pain behavior: Activity  Dependent, worse in the evening How long can you sit: 15-20 How long can you stand: History of prior back injury, pain, surgery, or therapy: Yes  PRECAUTIONS: Fall  WEIGHT BEARING RESTRICTIONS: No  FALLS: Has patient fallen in last 6 months? No  Living Environment Lives with:  lives with their family Lives in: House/apartment Stairs: No Has following equipment at home: None  Prior level of function: Independent  Occupational demands: Unemployed   Hobbies: Garden, Taking Care of [place] children, walking the dog   Patient Goals: To have no pain in my feet and back    OBJECTIVE:  Patient Surveys  Modified Oswestry 18 / 50 = 36.0 %    Cognition Patient is oriented to person, place, and time.  Recent memory is intact.  Remote memory is intact.  Attention span and concentration are intact.  Expressive speech is intact.  Patient's fund of knowledge is within normal limits for educational level.    Gross Musculoskeletal Assessment Tremor: None Bulk: Normal Tone: Normal  GAIT: Distance walked: 10 Assistive device utilized: None Level of assistance: Complete Independence Comments: Reciprocal WNL  Posture (Seated): Increased Thoracic Kyphosis Lumbar lordosis: WNL  AROM AROM (Normal range in degrees) AROM   Lumbar   Flexion (65) 100%  Extension (30) 100%  Right lateral flexion (25) 80% *   Left lateral flexion (25) 80%*   Right rotation (30) 100%  Left rotation (30) 100%      Hip Right Left  Flexion (125) WNL  WNL  Extension (15)    Abduction (40)    Adduction     Internal Rotation (45) 32 36  External Rotation (45)        Knee    Flexion (135)    Extension (0)        Ankle    Dorsiflexion (20)    Plantarflexion (50)    Inversion (35)    Eversion (15)    (* = pain; Blank rows = not tested)  LE MMT: MMT (out of 5) Right  Left   Hip flexion 4- 4-  Hip extension    Hip abduction 4- 4-  Hip adduction    Hip internal rotation    Hip external  rotation    Knee flexion 4 4  Knee extension 4 4  Ankle dorsiflexion 5 5  Ankle plantarflexion 5 5  Ankle inversion    Ankle eversion    (* = pain; Blank rows = not tested)  Sensation Grossly intact to light touch throughout bilateral LEs as determined by testing dermatomes L2-S2. Proprioception, stereognosis, and hot/cold testing deferred on this date.  Reflexes Deferred   Muscle Length Hamstrings: R: Negative L: Negative  Palpation Location Right Left         Lumbar paraspinals 0 0  Quadratus Lumborum    Gluteus Maximus 0 0  Gluteus Medius 1 1  Deep hip external rotators 2 2  PSIS    Fortin's Area (SIJ)    Greater Trochanter    Thoracic Paraspinals (T7-T12)  1 1  Rhomboids  1  1  (Blank rows = not tested) Graded on 0-4 scale (0 = no pain, 1 = pain, 2 = pain with wincing/grimacing/flinching, 3 = pain with withdrawal, 4 = unwilling to allow palpation)  Passive Accessory Intervertebral Motion Pt with reproduction of back pain with CPA L1-L5 and bilaterally T6-T12. Generally, hypomobile throughout  Special Tests Lumbar Radiculopathy and Discogenic: Slump (SN 83, -LR 0.32): R: Deferred   L: Negative   SLR (SN 92, -LR 0.29): R: Deferred L:  Deferred  Crossed SLR (SP 90): R: Deferred   L: Deferred   Facet Joint: Extension-Rotation (SN 100, -LR 0.0): R: Negative L: Negative  Hip: FABER (SN 81): R: Negative L: Negative FADIR (SN 94): R: Negative L:  Negative Hip scour (SN 50): R:  L:    Functional Tasks Deep squat: Narrow BoS, limited Ankle DF  Forward Step-Down Test: R: Hip IR/ADD L:  Lateral Step-Down Test: R: Hip IR/ADD L:   30s Sit to Stand: 16 reps  TODAY'S TREATMENT: DATE: 06/03/2024  Subjective: Patient reports 2-3/10 NPS in the middle and lower back. Patient reports that her mid/lower back pain has significantly improved since the start of her POC. Following the last PT appt she reports that she had less evening pain in her lower back. However she still  has neuropathic foot pain in both feet with some improvement following mobility exercises. No questions or concerns.   Therapeutic Exercise:  NuStep L4-1 x 5 min x UE/LE (Seat 7) for LE warm up, endurance and strength; PT manually adjusted resistance throughout bout.   Thoracic Extension against Foam Roller (Hook lying)  1 x 15   - Patient with minor neck pain limiting a second Technical brewer - Reach Under with Foam roller assist  R/L: 1 x 10 reps   OMEGA Cable Machine  Lat Pull Down    1 x 12, 25#    1 x 12, 35#    Scapular Row (Wide Grip)   2 x 10, 25#    Therapeutic Activity (focused on core stabilization, functional strengthening in order to optimize household ADLs):    Reviewed HEP:   Kettle Bell Swing  2 x 10 10#  - VC for KB height and velocity   Kettle Bell Squat  2 x 10 20#  Kettle Bell Dead lift   2 x 10 30#  - VC for increased hip flexion and reduced knee pain   Forward Step Down        PATIENT EDUCATION:  Education details: HEP, Exercise Technique  Person educated: Patient Education method: Explanation, Demonstration, and Handouts Education comprehension: verbalized understanding and returned demonstration   HOME EXERCISE PROGRAM:   Access Code: [email or web address] URL: [email or web address] Date: 06/03/2024 Prepared by: Lonni Tanikka Bresnan  Exercises - Supine Figure 4 Piriformis Stretch  - 2 x daily - 7 x weekly - 3 sets - 30 hold - Child's Pose Stretch  - 2 x daily - 7 x weekly - 3 sets - 30 hold - Standing Lean Away Doorway Stretch  - 1 x daily - 7 x weekly - 3 sets - 30s hold - Seated Hamstring Stretch  - 2 x daily - 7 x weekly - 3 sets - 30-60s hold - Supine Lower Trunk Rotation  - 1 x daily - 7 x weekly - 2-3 sets - 10-2 reps - Quadruped Thoracic Rotation - Reach Under  - 1 x daily - 7 x weekly - 3 sets - 10-12 reps - Sidelying Open Book Thoracic Lumbar Rotation and Extension  - 1 x daily - 7 x weekly - 2-3 sets -  10-12 reps - Quadruped Thoracic Rotation With Arm Straight  - 1 x daily - 7 x weekly - 3 sets - 10-12 reps - Squat with Resistance at Thighs  - 1 x daily - 3-4 x weekly - 2-3 sets - 10-12 reps - Forward Step Up  - 1 x daily - 3-4 x weekly - 2 sets - 10 reps - Forward Step Down Touch with Heel  - 1 x daily - 3-4 x weekly - 2 sets - 10 reps - Side Plank with Clam and Resistance  - 1 x daily - 3-4 x weekly - 3 sets -  8-10 reps - Supine Dead Bug with Leg Extension  - 1 x daily - 3-4 x weekly - 2-3 sets - 10-12 reps - Supine Bridge with Resistance Band  - 1 x daily - 3-4 x weekly - 2-3 sets - 10-12 reps - Kettlebell Squat  - 1 x daily - 3-4 x weekly - 2-3 sets - 10-12 reps - Kettlebell Swing  - 1 x daily - 3-4 x weekly - 2-3 sets - 10-12 reps   ASSESSMENT:  CLINICAL IMPRESSION: Dana  Hensley is a 64 y.o. female was seen for mid/low back pain. Pt reached end of POC and is agreeable to discharge to home with HEP. Pt has met all remaining goals and demonstrates improvements in core stability, ROM,  functional strength and pain as indicated as by 30s STS, ROM assessment, self reported pain score (See goals below). This session she tolerated all exercises without exacerbation of lower back pain. She was able to complete multiple compound lifts using a kettle bell with minimal cues for technique. The patient has achieved independence with daily activities and is able to perform exercises with proper technique.  They no longer require skilled physical therapy interventions and are discharged with a home exercise program to maintain progress. No question at end of session. Follow-up with their primary care provider is recommended if any issues arise.   OBJECTIVE IMPAIRMENTS: decreased activity tolerance, decreased strength, hypomobility, impaired flexibility, and pain.   ACTIVITY LIMITATIONS: carrying, lifting, bending, squatting, sleeping, transfers, and caring for others  PARTICIPATION LIMITATIONS: yard  work  PERSONAL FACTORS: Age, Behavior pattern, Past/current experiences, and Time since onset of injury/illness/exacerbation are also affecting patient's functional outcome.   REHAB POTENTIAL: Good  CLINICAL DECISION MAKING: Evolving/moderate complexity  EVALUATION COMPLEXITY: Moderate   GOALS: Goals reviewed with patient? No  SHORT TERM GOALS: Target date: 07/15/2024  Pt will be independent with HEP in order to improve strength and decrease back pain to improve pain-free function at home and work. Baseline: 03/31/2024: Initial Provided; 05/12/24: Pt endorses 100% adherence to HEP.  Goal status: GOAL MET    LONG TERM GOALS: Target date: 08/26/2024  Pt will demonstrate ability to navigate 15 steps on stairs pain free to demonstrate significant improvement in functional movements and pain.  Baseline: Navigated 16 steps (6) without rail support pain free Goal status: Goal MET  2.  Pt will decrease worst back pain by at least 2 points on the NPRS in order to demonstrate clinically significant reduction in back pain. Baseline: 03/31/2024: 8/10 NPRS; 05/12/2024: 6/10 (middle) 4/10 low back; 05/15/2024: 3/10 (mid/low back); 05/20/2024: 3-4/10 middle/low back; 06/03/2024: 2-3/10 mid/low back  Goal status: Goal Met       3.  Pt will decrease mODI score by at least 13 points in order demonstrate clinically significant reduction in back pain/disability.       Baseline: 03/31/2024:  18 / 50 = 36.0 % (50/50 Max Disability); 05/15/2024: 16 / 50 = 32.0 %; 06/03/2024: 21/50 = 42% Goal status: Progressing  4.  Pt will increase in R/L internal ROM to 45 in order to demonstrate increased tissue extensibility and decrease in pain. Baseline: 03/31/2024 Internal Rotation (45) R: 32 L: 36  06/03/2024 45 45   Goal status: Goal Met   5.  Pt will increase 30s STS by 5 reps in order to demonstrate improvements in LE strength and endurance Baseline: 03/31/2024: 16 reps; 05/12/2024; 16 reps;  05/20/2024: 13 reps; 06/03/2024: 18.5 Goal status: GOAL MET    PLAN: PT  FREQUENCY: 1-2x/week  PT DURATION: 12 weeks  PLANNED INTERVENTIONS: Discharge   PLAN FOR NEXT SESSION:  Discharge   Lonni Pall PT, DPT Physical Therapist- [facility]  [facility]  06/03/2024, 8:19 AM

## 2024-06-06 DIAGNOSIS — J3089 Other allergic rhinitis: Secondary | ICD-10-CM | POA: Diagnosis not present

## 2024-06-06 DIAGNOSIS — J301 Allergic rhinitis due to pollen: Secondary | ICD-10-CM | POA: Diagnosis not present

## 2024-06-06 DIAGNOSIS — J3081 Allergic rhinitis due to animal (cat) (dog) hair and dander: Secondary | ICD-10-CM | POA: Diagnosis not present

## 2024-06-07 DIAGNOSIS — Z419 Encounter for procedure for purposes other than remedying health state, unspecified: Secondary | ICD-10-CM | POA: Diagnosis not present

## 2024-06-10 DIAGNOSIS — J301 Allergic rhinitis due to pollen: Secondary | ICD-10-CM | POA: Diagnosis not present

## 2024-06-10 DIAGNOSIS — J3089 Other allergic rhinitis: Secondary | ICD-10-CM | POA: Diagnosis not present

## 2024-06-10 DIAGNOSIS — J3081 Allergic rhinitis due to animal (cat) (dog) hair and dander: Secondary | ICD-10-CM | POA: Diagnosis not present

## 2024-06-13 DIAGNOSIS — J3081 Allergic rhinitis due to animal (cat) (dog) hair and dander: Secondary | ICD-10-CM | POA: Diagnosis not present

## 2024-06-13 DIAGNOSIS — J3089 Other allergic rhinitis: Secondary | ICD-10-CM | POA: Diagnosis not present

## 2024-06-13 DIAGNOSIS — J301 Allergic rhinitis due to pollen: Secondary | ICD-10-CM | POA: Diagnosis not present

## 2024-06-17 ENCOUNTER — Encounter

## 2024-06-17 DIAGNOSIS — J3081 Allergic rhinitis due to animal (cat) (dog) hair and dander: Secondary | ICD-10-CM | POA: Diagnosis not present

## 2024-06-17 DIAGNOSIS — J3089 Other allergic rhinitis: Secondary | ICD-10-CM | POA: Diagnosis not present

## 2024-06-17 DIAGNOSIS — J301 Allergic rhinitis due to pollen: Secondary | ICD-10-CM | POA: Diagnosis not present

## 2024-06-24 DIAGNOSIS — J3089 Other allergic rhinitis: Secondary | ICD-10-CM | POA: Diagnosis not present

## 2024-06-24 DIAGNOSIS — J301 Allergic rhinitis due to pollen: Secondary | ICD-10-CM | POA: Diagnosis not present

## 2024-06-24 DIAGNOSIS — J3081 Allergic rhinitis due to animal (cat) (dog) hair and dander: Secondary | ICD-10-CM | POA: Diagnosis not present

## 2024-06-27 DIAGNOSIS — J3089 Other allergic rhinitis: Secondary | ICD-10-CM | POA: Diagnosis not present

## 2024-06-27 DIAGNOSIS — J301 Allergic rhinitis due to pollen: Secondary | ICD-10-CM | POA: Diagnosis not present

## 2024-06-27 DIAGNOSIS — J3081 Allergic rhinitis due to animal (cat) (dog) hair and dander: Secondary | ICD-10-CM | POA: Diagnosis not present

## 2024-07-01 DIAGNOSIS — J301 Allergic rhinitis due to pollen: Secondary | ICD-10-CM | POA: Diagnosis not present

## 2024-07-01 DIAGNOSIS — J3089 Other allergic rhinitis: Secondary | ICD-10-CM | POA: Diagnosis not present

## 2024-07-01 DIAGNOSIS — J3081 Allergic rhinitis due to animal (cat) (dog) hair and dander: Secondary | ICD-10-CM | POA: Diagnosis not present

## 2024-07-04 DIAGNOSIS — J3089 Other allergic rhinitis: Secondary | ICD-10-CM | POA: Diagnosis not present

## 2024-07-04 DIAGNOSIS — J301 Allergic rhinitis due to pollen: Secondary | ICD-10-CM | POA: Diagnosis not present

## 2024-07-04 DIAGNOSIS — J3081 Allergic rhinitis due to animal (cat) (dog) hair and dander: Secondary | ICD-10-CM | POA: Diagnosis not present

## 2024-07-07 ENCOUNTER — Ambulatory Visit (INDEPENDENT_AMBULATORY_CARE_PROVIDER_SITE_OTHER): Admitting: Family Medicine

## 2024-07-07 ENCOUNTER — Encounter: Payer: Self-pay | Admitting: Family Medicine

## 2024-07-07 VITALS — BP 124/82 | HR 67 | Ht 65.0 in | Wt 179.5 lb

## 2024-07-07 DIAGNOSIS — Z0000 Encounter for general adult medical examination without abnormal findings: Secondary | ICD-10-CM | POA: Diagnosis not present

## 2024-07-07 DIAGNOSIS — E785 Hyperlipidemia, unspecified: Secondary | ICD-10-CM | POA: Diagnosis not present

## 2024-07-07 DIAGNOSIS — Z1231 Encounter for screening mammogram for malignant neoplasm of breast: Secondary | ICD-10-CM | POA: Diagnosis not present

## 2024-07-07 DIAGNOSIS — E039 Hypothyroidism, unspecified: Secondary | ICD-10-CM

## 2024-07-07 DIAGNOSIS — M79671 Pain in right foot: Secondary | ICD-10-CM

## 2024-07-07 DIAGNOSIS — M79672 Pain in left foot: Secondary | ICD-10-CM

## 2024-07-07 DIAGNOSIS — R7303 Prediabetes: Secondary | ICD-10-CM | POA: Diagnosis not present

## 2024-07-07 MED ORDER — ROSUVASTATIN CALCIUM 10 MG PO TABS: 10.0000 mg | ORAL_TABLET | Freq: Every day | ORAL | 3 refills | Status: AC

## 2024-07-07 MED ORDER — LEVOTHYROXINE SODIUM 75 MCG PO TABS
75.0000 ug | ORAL_TABLET | Freq: Every day | ORAL | 3 refills | Status: AC
Start: 2024-07-07 — End: ?

## 2024-07-07 NOTE — Progress Notes (Signed)
 Subjective:    Patient ID: Dana Hensley  I Parfait, female    DOB: [birth year], 63 y.o.   MRN: [id number]  Dana  I Hensley is a 63 y.o. female presenting on 07/07/2024 for Annual Exam   HPI  Discussed the use of AI scribe software for clinical note transcription with the patient, who gave verbal consent to proceed.  History of Present Illness   Dana Hensley  I Hensley is a 63 year old female who presents for an annual physical exam and follow-up on bilateral heel pain.  Bilateral heel pain Onset 12/2023, prior Podiatry [facility], attempted to do further work up and suggest referrals, she has seen Vascular, neurosurgery, has had imaging X-ray of back and spine and vascular study and EMG. Normal. She has not had foot x-rays Pain in both heels persistent now She has done physiotherapy, chiropractor  - Persistent bilateral heel pain, left greater than right - Pain is mild in the morning and worsens throughout the day, becoming severe by evening - Pain intensifies by afternoon, making ambulation difficult by the end of the day - Pain interferes with walking and sleep - Limited relief from physical therapy, chiropractic care, and massage therapy - No improvement with medication prescribed by spine specialist - Insurance did not approve lidocaine  patches; over-the-counter options ineffective - Concerned about progression to inability to walk - No recent fever or other systemic symptoms  Hypothyroidism - Managed with levothyroxine   Hyperlipidemia - Managed with rosuvastatin           Health Maintenance:   Last Colonoscopy 04/21/21 - negative, no specimens collected. Repeat in 5 years. Approx 2027.   She will need Endoscopy EGD + Colonoscopy next time. She needs EGD yearly for surveillance.       07/07/2024    1:17 PM 01/18/2024   10:00 AM 07/24/2023    2:10 PM  Depression screen PHQ 2/9  Decreased Interest 0 0 0  Down, Depressed, Hopeless 0 0 0  PHQ - 2 Score 0 0 0  Altered sleeping 0 2 1  Tired,  decreased energy 1 0 0  Change in appetite   0  Feeling bad or failure about yourself  0 0 0  Trouble concentrating 0 0 0  Moving slowly or fidgety/restless 0 0 0  Suicidal thoughts 0 0 0  PHQ-9 Score 1 2 1   Difficult doing work/chores Not difficult at all Not difficult at all Not difficult at all       07/07/2024    1:18 PM 01/18/2024   10:00 AM 07/24/2023    2:10 PM  GAD 7 : Generalized Anxiety Score  Nervous, Anxious, on Edge 0 0 0  Control/stop worrying 0 0 0  Worry too much - different things 1 1 1   Trouble relaxing 0 1 0  Restless 0 0 0  Easily annoyed or irritable 0 0 1  Afraid - awful might happen 0 0 1  Total GAD 7 Score 1 2 3   Anxiety Difficulty Not difficult at all  Not difficult at all     Past Medical History:  Diagnosis Date   Allergy    Anxiety    Asthma    B12 deficiency    Barrett esophagus    Chronic atrophic gastritis    DVT, lower extremity (HCC)    Gastric polyps    HLD (hyperlipidemia)    Hypothyroidism    IGT (impaired glucose tolerance)    Neuroendocrine tumor    carcinoid tumors of the stomach, s/p extraction surgery at  [facility]   PONV (postoperative nausea and vomiting)    Vitamin D  deficiency    Past Surgical History:  Procedure Laterality Date   APPENDECTOMY     CHOLECYSTECTOMY N/A 11/16/2016   Procedure: LAPAROSCOPIC CHOLECYSTECTOMY WITH INTRAOPERATIVE CHOLANGIOGRAM;  Surgeon: Larinda Unknown Sharps, MD;  Location: [facility] ORS;  Service: General;  Laterality: N/A;   COLONOSCOPY WITH PROPOFOL  N/A 04/21/2021   Procedure: COLONOSCOPY WITH PROPOFOL ;  Surgeon: Jinny Carmine, MD;  Location: [facility] END[facility];  Service: Endoscopy;  Laterality: N/A;   DIAGNOSTIC LAPAROSCOPY     ESOPHAGOGASTRODUODENOSCOPY     TONSILLECTOMY     TOTAL ABDOMINAL HYSTERECTOMY W/ BILATERAL SALPINGOOPHORECTOMY  2012   due to ovar cysts/pain   VEIN LIGATION AND STRIPPING     Social History   Socioeconomic History   Marital status: Married    Spouse name: Not on file    Number of children: 1   Years of education: Not on file   Highest education level: Not on file  Occupational History   Not on file  Tobacco Use   Smoking status: Never   Smokeless tobacco: Never  Vaping Use   Vaping status: Never Used  Substance and Sexual Activity   Alcohol use: No    Alcohol/week: 0.0 standard drinks of alcohol   Drug use: No   Sexual activity: Not Currently    Birth control/protection: None, Surgical    Comment: Hysterectomy  Other Topics Concern   Not on file  Social History Narrative   Not on file   Social Drivers of Health   Financial Resource Strain: Not on file  Food Insecurity: Not on file  Transportation Needs: Not on file  Physical Activity: Not on file  Stress: Not on file  Social Connections: Not on file  Intimate Partner Violence: Not on file   Family History  Problem Relation Age of Onset   Clotting disorder Mother    Hyperlipidemia Father    Breast cancer Neg Hx    Current Outpatient Medications on File Prior to Visit  Medication Sig   cyanocobalamin  (VITAMIN B12) 1000 MCG tablet Take by mouth.   cyanocobalamin  (VITAMIN B12) 1000 MCG/ML injection Inject 1 mL (1,000 mcg total) into the muscle every 30 (thirty) days.   dicyclomine  (BENTYL ) 10 MG capsule Take by mouth.   fluticasone  (FLONASE ) 50 MCG/ACT nasal spray Place 2 sprays into both nostrils daily.   ipratropium (ATROVENT) 0.03 % nasal spray Place 1 spray into both nostrils daily as needed.   levocetirizine (XYZAL) 5 MG tablet Take 5 mg by mouth every evening.   montelukast (SINGULAIR) 10 MG tablet Take 10 mg by mouth at bedtime.   Multiple Vitamin (MULTI-VITAMINS) TABS Take 1 tablet by mouth daily.    Tuberculin-Allergy Syringes (BD ALLERGY SYRINGE) 27G X 3/8 1 ML MISC every Wednesday Patient receives allergy shot once a week.   No current facility-administered medications on file prior to visit.    Review of Systems  Constitutional:  Negative for activity change, appetite  change, chills, diaphoresis, fatigue and fever.  HENT:  Negative for congestion and hearing loss.   Eyes:  Negative for visual disturbance.  Respiratory:  Negative for cough, chest tightness, shortness of breath and wheezing.   Cardiovascular:  Negative for chest pain, palpitations and leg swelling.  Gastrointestinal:  Negative for abdominal pain, constipation, diarrhea, nausea and vomiting.  Genitourinary:  Negative for dysuria, frequency and hematuria.  Musculoskeletal:  Negative for arthralgias and neck pain.       Foot pain  Skin:  Negative for rash.  Neurological:  Negative for dizziness, weakness, light-headedness, numbness and headaches.  Hematological:  Negative for adenopathy.  Psychiatric/Behavioral:  Negative for behavioral problems, dysphoric mood and sleep disturbance.    Per HPI unless specifically indicated above     Objective:    BP 124/82 (BP Location: Left Arm, Patient Position: Sitting, Cuff Size: Normal)   Pulse 67   Ht 5' 5 (1.651 m)   Wt 179 lb 8 oz (81.4 kg)   SpO2 97%   BMI 29.87 kg/m   Wt Readings from Last 3 Encounters:  07/07/24 179 lb 8 oz (81.4 kg)  05/21/24 177 lb (80.3 kg)  03/26/24 177 lb (80.3 kg)    Physical Exam Vitals and nursing note reviewed.  Constitutional:      General: She is not in acute distress.    Appearance: She is well-developed. She is not diaphoretic.     Comments: Well-appearing, comfortable, cooperative  HENT:     Head: Normocephalic and atraumatic.  Eyes:     General:        Right eye: No discharge.        Left eye: No discharge.     Conjunctiva/sclera: Conjunctivae normal.     Pupils: Pupils are equal, round, and reactive to light.  Neck:     Thyroid : No thyromegaly.     Vascular: No carotid bruit.  Cardiovascular:     Rate and Rhythm: Normal rate and regular rhythm.     Pulses: Normal pulses.     Heart sounds: Normal heart sounds. No murmur heard. Pulmonary:     Effort: Pulmonary effort is normal. No  respiratory distress.     Breath sounds: Normal breath sounds. No wheezing or rales.  Abdominal:     General: Bowel sounds are normal. There is no distension.     Palpations: Abdomen is soft. There is no mass.     Tenderness: There is no abdominal tenderness.  Musculoskeletal:        General: No tenderness. Normal range of motion.     Cervical back: Normal range of motion and neck supple.     Right lower leg: No edema.     Left lower leg: No edema.     Comments: Upper / Lower Extremities: - Normal muscle tone, strength bilateral upper extremities 5/5, lower extremities 5/5  Lymphadenopathy:     Cervical: No cervical adenopathy.  Skin:    General: Skin is warm and dry.     Findings: No erythema or rash.  Neurological:     Mental Status: She is alert and oriented to person, place, and time.     Comments: Distal sensation intact to light touch all extremities  Psychiatric:        Mood and Affect: Mood normal.        Behavior: Behavior normal.        Thought Content: Thought content normal.     Comments: Well groomed, good eye contact, normal speech and thoughts     I have personally reviewed the radiology report from 01/18/24 Thoracic and Lumbar X-ray  Study Result CLINICAL DATA: Chronic low back pain  EXAM: LUMBAR SPINE - COMPLETE 4+ VIEW  COMPARISON: CT abdomen and pelvis 06/14/2017  FINDINGS: Normal anatomic alignment. No evidence for acute fracture or dislocation. Preservation of the vertebral body heights. Mild L5-S1 degenerative disc disease. Lower lumbar spine facet degenerative changes. Stool throughout the colon. Cholecystectomy clips. SI joints unremarkable.  IMPRESSION: Lower lumbar spine degenerative  disc and facet disease.   Electronically Signed By: Bard Moats M.D. On: 02/07/2024 23:18  ---   CLINICAL DATA: Chronic mid back pain.  EXAM: THORACIC SPINE - 3 VIEWS  COMPARISON: CT chest 08/02/2021  FINDINGS: Normal anatomic alignment. No acute  fracture or dislocation. Mild midthoracic spine degenerative disc disease. Lungs are clear.  IMPRESSION: Mild midthoracic spine degenerative disc disease.   Electronically Signed By: Bard Moats M.D. On: 02/07/2024 23:19   Results for orders placed or performed in visit on 03/10/24  VAS US  ABI WITH/WO TBI   Collection Time: 03/10/24  8:30 AM  Result Value Ref Range   Right ABI 1.29    Left ABI 1.21       Assessment & Plan:   Problem List Items Addressed This Visit     Acquired hypothyroidism   Relevant Medications   levothyroxine  (SYNTHROID ) 75 MCG tablet   Other Relevant Orders   TSH   T4, free   Dyslipidemia   Relevant Medications   rosuvastatin  (CRESTOR ) 10 MG tablet   Other Relevant Orders   TSH   Lipid panel   Comprehensive metabolic panel with GFR   Other Visit Diagnoses       Annual physical exam    -  Primary   Relevant Orders   TSH   Lipid panel   Hemoglobin A1c   CBC with Differential/Platelet   Comprehensive metabolic panel with GFR     Encounter for screening mammogram for malignant neoplasm of breast       Relevant Orders   MM 3D SCREENING MAMMOGRAM BILATERAL BREAST     Bilateral foot pain       Relevant Orders   Ambulatory referral to Podiatry     Heel pain, bilateral       Relevant Orders   Ambulatory referral to Podiatry     Pre-diabetes       Relevant Orders   Hemoglobin A1c        Updated Health Maintenance information Reviewed recent lab results with patient Encouraged improvement to lifestyle with diet and exercise Goal of weight loss  Bilateral heel pain Chronic heel pain worse in past 6 months, possibly plantar fasciitis. Previous treatments ineffective. Extensive work up prompted after Podiatry from 12/2023, has seen Vascular and Neurosurgery, has had imaging nerve conduction study, ultrasound, back X-rays. Awaiting MRI has not obtained yet. - Negative work up, symptoms most consistent with localized foot heel / arch pain -  Will refer to other Podiatry for new opinion and evaluation. To Maryl  Adult wellness visit Annual wellness visit completed. Blood work, mammogram, and colonoscopy scheduled. - Schedule blood work for July 08, 2024 at 8:30 AM. - Order mammogram for December 2025. - Coordinate colonoscopy with endoscopy in 2026. - She will call [facility] to coordinate this maybe both 2026 or both 2027  Hypothyroidism Hypothyroidism well-managed with levothyroxine . - Renew levothyroxine  prescription for 90-day supply.  Hyperlipidemia - Renew rosuvastatin  prescription for 90-day supply.  General health maintenance Vaccines declined. Heart scan discussion deferred. - Decline flu, shingles, and pneumonia vaccines. - Defer heart scan discussion.         Orders Placed This Encounter  Procedures   MM 3D SCREENING MAMMOGRAM BILATERAL BREAST    Standing Status:   Future    Expiration Date:   07/07/2025    Reason for Exam (SYMPTOM  OR DIAGNOSIS REQUIRED):   Screening bilateral 3D Mammogram Tomo    Preferred imaging location?:   [facility]  TSH   Lipid panel    Has the patient fasted?:   Yes   Hemoglobin A1c   CBC with Differential/Platelet   Comprehensive metabolic panel with GFR    Has the patient fasted?:   Yes   T4, free   Ambulatory referral to Podiatry    Referral Priority:   Routine    Referral Type:   Consultation    Referral Reason:   Specialty Services Required    Requested Specialty:   Podiatry    Number of Visits Requested:   1    Meds ordered this encounter  Medications   rosuvastatin  (CRESTOR ) 10 MG tablet    Sig: Take 1 tablet (10 mg total) by mouth daily.    Dispense:  90 tablet    Refill:  3    90 day   levothyroxine  (SYNTHROID ) 75 MCG tablet    Sig: Take 1 tablet (75 mcg total) by mouth daily before breakfast.    Dispense:  90 tablet    Refill:  3    90 day     Follow up plan: Return if symptoms worsen or fail to improve.  Marsa Officer,  DO [facility] 07/07/2024, 1:22 PM

## 2024-07-07 NOTE — Patient Instructions (Addendum)
 Thank you for coming to the office today.  For Mammogram screening for breast cancer   Call the Imaging Center below anytime to schedule your own appointment now that order has been placed.  [facility] at [facility] [address], [address] [place], [place] [address] Phone: [phone]  -----------------------  Next Colonoscopy is in 2027 expected  Your [facility] is scheduled for 04/03/25  If you want to do them at the same time, please call [facility] and ask them if they can make it work on the same time.  I agree that you have done everything correct as far as investigating the foot pain, you have done x-rays, nerve conduction study, footwear upgrades, chiropractor, PT therapy.  At this point, given lack of results and clear diagnosis, I think best option is to go to other location for another opinion on [facility] [address] [place], [place] [address] Hours - M-F 8-5 Phone: [phone] phone    Please schedule a Follow-up Appointment to: Return if symptoms worsen or fail to improve.  If you have any other questions or concerns, please feel free to call the office or send a message through MyChart. You may also schedule an earlier appointment if necessary.  Additionally, you may be receiving a survey about your experience at our office within a few days to 1 week by e-mail or mail. We value your feedback.  Marsa Officer, DO [facility], [place]

## 2024-07-08 ENCOUNTER — Other Ambulatory Visit

## 2024-07-08 DIAGNOSIS — J3089 Other allergic rhinitis: Secondary | ICD-10-CM | POA: Diagnosis not present

## 2024-07-08 DIAGNOSIS — R7303 Prediabetes: Secondary | ICD-10-CM | POA: Diagnosis not present

## 2024-07-08 DIAGNOSIS — E039 Hypothyroidism, unspecified: Secondary | ICD-10-CM | POA: Diagnosis not present

## 2024-07-08 DIAGNOSIS — J301 Allergic rhinitis due to pollen: Secondary | ICD-10-CM | POA: Diagnosis not present

## 2024-07-08 DIAGNOSIS — J3081 Allergic rhinitis due to animal (cat) (dog) hair and dander: Secondary | ICD-10-CM | POA: Diagnosis not present

## 2024-07-08 DIAGNOSIS — Z419 Encounter for procedure for purposes other than remedying health state, unspecified: Secondary | ICD-10-CM | POA: Diagnosis not present

## 2024-07-08 DIAGNOSIS — E785 Hyperlipidemia, unspecified: Secondary | ICD-10-CM | POA: Diagnosis not present

## 2024-07-08 DIAGNOSIS — Z0000 Encounter for general adult medical examination without abnormal findings: Secondary | ICD-10-CM | POA: Diagnosis not present

## 2024-07-09 LAB — CBC WITH DIFFERENTIAL/PLATELET
Absolute Lymphocytes: 2189 {cells}/uL (ref 850–3900)
Absolute Monocytes: 339 {cells}/uL (ref 200–950)
Basophils Absolute: 21 {cells}/uL (ref 0–200)
Basophils Relative: 0.4 %
Eosinophils Absolute: 228 {cells}/uL (ref 15–500)
Eosinophils Relative: 4.3 %
HCT: 37.7 % (ref 35.0–45.0)
Hemoglobin: 12 g/dL (ref 11.7–15.5)
MCH: 28.4 pg (ref 27.0–33.0)
MCHC: 31.8 g/dL — ABNORMAL LOW (ref 32.0–36.0)
MCV: 89.3 fL (ref 80.0–100.0)
MPV: 9.7 fL (ref 7.5–12.5)
Monocytes Relative: 6.4 %
Neutro Abs: 2523 {cells}/uL (ref 1500–7800)
Neutrophils Relative %: 47.6 %
Platelets: 236 Thousand/uL (ref 140–400)
RBC: 4.22 Million/uL (ref 3.80–5.10)
RDW: 13 % (ref 11.0–15.0)
Total Lymphocyte: 41.3 %
WBC: 5.3 Thousand/uL (ref 3.8–10.8)

## 2024-07-09 LAB — COMPREHENSIVE METABOLIC PANEL WITH GFR
AG Ratio: 1.5 (calc) (ref 1.0–2.5)
ALT: 11 U/L (ref 6–29)
AST: 15 U/L (ref 10–35)
Albumin: 4.2 g/dL (ref 3.6–5.1)
Alkaline phosphatase (APISO): 55 U/L (ref 37–153)
BUN: 13 mg/dL (ref 7–25)
CO2: 28 mmol/L (ref 20–32)
Calcium: 9.4 mg/dL (ref 8.6–10.4)
Chloride: 105 mmol/L (ref 98–110)
Creat: 0.68 mg/dL (ref 0.50–1.05)
Globulin: 2.8 g/dL (ref 1.9–3.7)
Glucose, Bld: 87 mg/dL (ref 65–99)
Potassium: 4.3 mmol/L (ref 3.5–5.3)
Sodium: 140 mmol/L (ref 135–146)
Total Bilirubin: 0.5 mg/dL (ref 0.2–1.2)
Total Protein: 7 g/dL (ref 6.1–8.1)
eGFR: 98 mL/min/1.73m2 (ref >=60)

## 2024-07-09 LAB — LIPID PANEL
Cholesterol: 180 mg/dL (ref <200)
HDL: 48 mg/dL — ABNORMAL LOW (ref >=50)
LDL Cholesterol (Calc): 113 mg/dL — ABNORMAL HIGH
Non-HDL Cholesterol (Calc): 132 mg/dL — ABNORMAL HIGH (ref <130)
Total CHOL/HDL Ratio: 3.8 (calc) (ref <5.0)
Triglycerides: 86 mg/dL (ref <150)

## 2024-07-09 LAB — HEMOGLOBIN A1C
Hgb A1c MFr Bld: 5.9 % — ABNORMAL HIGH (ref <5.7)
Mean Plasma Glucose: 123 mg/dL
eAG (mmol/L): 6.8 mmol/L

## 2024-07-09 LAB — T4, FREE: Free T4: 1.3 ng/dL (ref 0.8–1.8)

## 2024-07-09 LAB — TSH: TSH: 1.95 m[IU]/L (ref 0.40–4.50)

## 2024-07-10 ENCOUNTER — Ambulatory Visit: Payer: Self-pay | Admitting: Family Medicine

## 2024-07-11 DIAGNOSIS — J3089 Other allergic rhinitis: Secondary | ICD-10-CM | POA: Diagnosis not present

## 2024-07-11 DIAGNOSIS — J3081 Allergic rhinitis due to animal (cat) (dog) hair and dander: Secondary | ICD-10-CM | POA: Diagnosis not present

## 2024-07-11 DIAGNOSIS — J301 Allergic rhinitis due to pollen: Secondary | ICD-10-CM | POA: Diagnosis not present

## 2024-07-13 ENCOUNTER — Other Ambulatory Visit: Payer: Self-pay | Admitting: Family Medicine

## 2024-07-13 DIAGNOSIS — E538 Deficiency of other specified B group vitamins: Secondary | ICD-10-CM

## 2024-07-15 NOTE — Telephone Encounter (Signed)
 Rx 08/08/23 3ml 3RF- too soon Requested Prescriptions  Pending Prescriptions Disp Refills   cyanocobalamin  (VITAMIN B12) 1000 MCG/ML injection [Pharmacy Med Name: CYANOCOBALAMIN  1,000 MCG/ML VL] 3 mL 3    Sig: INJECT 1 ML (1,000 MCG TOTAL) INTO THE MUSCLE EVERY 30 DAYS.     Endocrinology:  Vitamins - Vitamin B12 Passed - 07/15/2024  4:03 PM      Passed - HCT in normal range and within 360 days    HCT  Date Value Ref Range Status  07/08/2024 37.7 35.0 - 45.0 % Final   Hematocrit  Date Value Ref Range Status  10/26/2015 37.1 34.0 - 46.6 % Final         Passed - HGB in normal range and within 360 days    Hemoglobin  Date Value Ref Range Status  07/08/2024 12.0 11.7 - 15.5 g/dL Final  88/70/7983 87.6 11.1 - 15.9 g/dL Final         Passed - B12 Level in normal range and within 360 days    Vitamin B-12  Date Value Ref Range Status  07/25/2023 688 200 - 1,100 pg/mL Final         Passed - Valid encounter within last 12 months    Recent Outpatient Visits           1 week ago Annual physical exam   [facility] [place], Marsa PARAS, DO   5 months ago Chronic frontal sinusitis   [facility] [place], Marsa PARAS, [place]

## 2024-07-18 DIAGNOSIS — J301 Allergic rhinitis due to pollen: Secondary | ICD-10-CM | POA: Diagnosis not present

## 2024-07-18 DIAGNOSIS — J3089 Other allergic rhinitis: Secondary | ICD-10-CM | POA: Diagnosis not present

## 2024-07-18 DIAGNOSIS — J3081 Allergic rhinitis due to animal (cat) (dog) hair and dander: Secondary | ICD-10-CM | POA: Diagnosis not present

## 2024-07-22 ENCOUNTER — Encounter: Payer: Self-pay | Admitting: Family Medicine

## 2024-08-05 DIAGNOSIS — J301 Allergic rhinitis due to pollen: Secondary | ICD-10-CM | POA: Diagnosis not present

## 2024-08-05 DIAGNOSIS — J3081 Allergic rhinitis due to animal (cat) (dog) hair and dander: Secondary | ICD-10-CM | POA: Diagnosis not present

## 2024-08-05 DIAGNOSIS — J3089 Other allergic rhinitis: Secondary | ICD-10-CM | POA: Diagnosis not present

## 2024-08-08 DIAGNOSIS — Z419 Encounter for procedure for purposes other than remedying health state, unspecified: Secondary | ICD-10-CM | POA: Diagnosis not present

## 2024-08-12 DIAGNOSIS — J301 Allergic rhinitis due to pollen: Secondary | ICD-10-CM | POA: Diagnosis not present

## 2024-08-12 DIAGNOSIS — J3089 Other allergic rhinitis: Secondary | ICD-10-CM | POA: Diagnosis not present

## 2024-08-12 DIAGNOSIS — J3081 Allergic rhinitis due to animal (cat) (dog) hair and dander: Secondary | ICD-10-CM | POA: Diagnosis not present

## 2024-08-14 DIAGNOSIS — K219 Gastro-esophageal reflux disease without esophagitis: Secondary | ICD-10-CM | POA: Diagnosis not present

## 2024-08-14 DIAGNOSIS — R7302 Impaired glucose tolerance (oral): Secondary | ICD-10-CM | POA: Diagnosis not present

## 2024-08-14 DIAGNOSIS — M79671 Pain in right foot: Secondary | ICD-10-CM | POA: Diagnosis not present

## 2024-08-14 DIAGNOSIS — M79672 Pain in left foot: Secondary | ICD-10-CM | POA: Diagnosis not present

## 2024-08-14 DIAGNOSIS — M722 Plantar fascial fibromatosis: Secondary | ICD-10-CM | POA: Diagnosis not present

## 2024-08-19 DIAGNOSIS — J3089 Other allergic rhinitis: Secondary | ICD-10-CM | POA: Diagnosis not present

## 2024-08-19 DIAGNOSIS — J301 Allergic rhinitis due to pollen: Secondary | ICD-10-CM | POA: Diagnosis not present

## 2024-08-19 DIAGNOSIS — J3081 Allergic rhinitis due to animal (cat) (dog) hair and dander: Secondary | ICD-10-CM | POA: Diagnosis not present

## 2024-08-26 DIAGNOSIS — J301 Allergic rhinitis due to pollen: Secondary | ICD-10-CM | POA: Diagnosis not present

## 2024-08-26 DIAGNOSIS — J3089 Other allergic rhinitis: Secondary | ICD-10-CM | POA: Diagnosis not present

## 2024-08-26 DIAGNOSIS — J3081 Allergic rhinitis due to animal (cat) (dog) hair and dander: Secondary | ICD-10-CM | POA: Diagnosis not present

## 2024-09-02 DIAGNOSIS — J3089 Other allergic rhinitis: Secondary | ICD-10-CM | POA: Diagnosis not present

## 2024-09-02 DIAGNOSIS — J3081 Allergic rhinitis due to animal (cat) (dog) hair and dander: Secondary | ICD-10-CM | POA: Diagnosis not present

## 2024-09-02 DIAGNOSIS — J301 Allergic rhinitis due to pollen: Secondary | ICD-10-CM | POA: Diagnosis not present

## 2024-09-07 DIAGNOSIS — Z419 Encounter for procedure for purposes other than remedying health state, unspecified: Secondary | ICD-10-CM | POA: Diagnosis not present

## 2024-09-08 ENCOUNTER — Encounter (INDEPENDENT_AMBULATORY_CARE_PROVIDER_SITE_OTHER): Payer: Self-pay | Admitting: Vascular Surgery

## 2024-09-08 ENCOUNTER — Ambulatory Visit (INDEPENDENT_AMBULATORY_CARE_PROVIDER_SITE_OTHER): Admitting: Vascular Surgery

## 2024-09-08 VITALS — BP 147/85 | HR 66 | Resp 18 | Ht 65.0 in | Wt 178.6 lb

## 2024-09-08 DIAGNOSIS — I872 Venous insufficiency (chronic) (peripheral): Secondary | ICD-10-CM | POA: Diagnosis not present

## 2024-09-08 DIAGNOSIS — E785 Hyperlipidemia, unspecified: Secondary | ICD-10-CM | POA: Diagnosis not present

## 2024-09-08 DIAGNOSIS — I890 Lymphedema, not elsewhere classified: Secondary | ICD-10-CM

## 2024-09-08 DIAGNOSIS — I83813 Varicose veins of bilateral lower extremities with pain: Secondary | ICD-10-CM | POA: Diagnosis not present

## 2024-09-09 DIAGNOSIS — J301 Allergic rhinitis due to pollen: Secondary | ICD-10-CM | POA: Diagnosis not present

## 2024-09-09 DIAGNOSIS — J3081 Allergic rhinitis due to animal (cat) (dog) hair and dander: Secondary | ICD-10-CM | POA: Diagnosis not present

## 2024-09-09 DIAGNOSIS — J3089 Other allergic rhinitis: Secondary | ICD-10-CM | POA: Diagnosis not present

## 2024-09-15 ENCOUNTER — Encounter (INDEPENDENT_AMBULATORY_CARE_PROVIDER_SITE_OTHER): Payer: Self-pay | Admitting: Vascular Surgery

## 2024-09-15 NOTE — Progress Notes (Signed)
 MRN : [id number]  Dana Hensley is a 63 y.o. ([birth year]) female who presents with chief complaint of legs hurt and swell.  History of Present Illness:   The patient returns to the office for followup evaluation regarding leg swelling.  The swelling has persisted and the pain associated with swelling continues. There have not been any interval development of a ulcerations or wounds.  Since the previous visit the patient has been wearing graduated compression stockings and has noted little if any improvement in the lymphedema. The patient has been using compression routinely morning until night.  The patient also states elevation during the day and exercise is being done too.  Current Meds  Medication Sig   cyanocobalamin  (VITAMIN B12) 1000 MCG tablet Take by mouth.   cyanocobalamin  (VITAMIN B12) 1000 MCG/ML injection Inject 1 mL (1,000 mcg total) into the muscle every 30 (thirty) days.   dicyclomine  (BENTYL ) 10 MG capsule Take by mouth.   fluticasone  (FLONASE ) 50 MCG/ACT nasal spray Place 2 sprays into both nostrils daily.   ipratropium (ATROVENT) 0.03 % nasal spray Place 1 spray into both nostrils daily as needed.   levocetirizine (XYZAL) 5 MG tablet Take 5 mg by mouth every evening.   levothyroxine  (SYNTHROID ) 75 MCG tablet Take 1 tablet (75 mcg total) by mouth daily before breakfast.   montelukast (SINGULAIR) 10 MG tablet Take 10 mg by mouth at bedtime.   Multiple Vitamin (MULTI-VITAMINS) TABS Take 1 tablet by mouth daily.    rosuvastatin  (CRESTOR ) 10 MG tablet Take 1 tablet (10 mg total) by mouth daily.   Tuberculin-Allergy Syringes (BD ALLERGY SYRINGE) 27G X 3/8 1 ML MISC every Wednesday Patient receives allergy shot once a week.    Past Medical History:  Diagnosis Date   Allergy    Anxiety    Asthma    B12 deficiency    Barrett esophagus    Chronic atrophic gastritis    DVT, lower extremity (HCC)    Gastric polyps    HLD (hyperlipidemia)    Hypothyroidism     IGT (impaired glucose tolerance)    Neuroendocrine tumor (HCC)    carcinoid tumors of the stomach, s/p extraction surgery at [facility]   PONV (postoperative nausea and vomiting)    Vitamin D  deficiency     Past Surgical History:  Procedure Laterality Date   APPENDECTOMY     CHOLECYSTECTOMY N/A 11/16/2016   Procedure: LAPAROSCOPIC CHOLECYSTECTOMY WITH INTRAOPERATIVE CHOLANGIOGRAM;  Surgeon: Larinda Unknown Sharps, MD;  Location: [facility] ORS;  Service: General;  Laterality: N/A;   COLONOSCOPY WITH PROPOFOL  N/A 04/21/2021   Procedure: COLONOSCOPY WITH PROPOFOL ;  Surgeon: Jinny Carmine, MD;  Location: [facility] ENDOSCOPY;  Service: Endoscopy;  Laterality: N/A;   DIAGNOSTIC LAPAROSCOPY     ESOPHAGOGASTRODUODENOSCOPY     TONSILLECTOMY     TOTAL ABDOMINAL HYSTERECTOMY W/ BILATERAL SALPINGOOPHORECTOMY  2012   due to ovar cysts/pain   VEIN LIGATION AND STRIPPING      Social History Social History   Tobacco Use   Smoking status: Never   Smokeless tobacco: Never  Vaping Use   Vaping status: Never Used  Substance Use Topics   Alcohol use: No    Alcohol/week: 0.0 standard drinks of alcohol   Drug use: No    Family History Family History  Problem Relation Age of Onset   Clotting disorder Mother    Hyperlipidemia Father    Breast cancer Neg Hx     Allergies  Allergen Reactions  Latex Swelling    SWELLING AROUND FACE   Neomycin Other (See Comments) and Rash    Seen from allergy skin test Seen from allergy skin test   Neosporin  [Neomycin-Bacitracin Zn-Polymyx] Other (See Comments)    Seen from allergy skin test   Neomycin-Polymyxin-Gramicidin     Other reaction(s): Unknown Seen from allergy skin test   Other Other (See Comments)    THIMERSOL. THIMERSOL- seen from allergy skin test  Other reaction(s): Unknown     REVIEW OF SYSTEMS (Negative unless checked)  Constitutional: [] Weight loss  [] Fever  [] Chills Cardiac: [] Chest pain   [] Chest pressure   [] Palpitations   [] Shortness of  breath when laying flat   [] Shortness of breath with exertion. Vascular:  [] Pain in legs with walking   [x] Pain in legs at rest  [] History of DVT   [] Phlebitis   [x] Swelling in legs   [] Varicose veins   [] Non-healing ulcers Pulmonary:   [] Uses home oxygen   [] Productive cough   [] Hemoptysis   [] Wheeze  [] COPD   [] Asthma Neurologic:  [] Dizziness   [] Seizures   [] History of stroke   [] History of TIA  [] Aphasia   [] Vissual changes   [] Weakness or numbness in arm   [] Weakness or numbness in leg Musculoskeletal:   [] Joint swelling   [] Joint pain   [] Low back pain Hematologic:  [] Easy bruising  [] Easy bleeding   [] Hypercoagulable state   [] Anemic Gastrointestinal:  [] Diarrhea   [] Vomiting  [] Gastroesophageal reflux/heartburn   [] Difficulty swallowing. Genitourinary:  [] Chronic kidney disease   [] Difficult urination  [] Frequent urination   [] Blood in urine Skin:  [] Rashes   [] Ulcers  Psychological:  [] History of anxiety   []  History of major depression.  Physical Examination  Vitals:   09/08/24 1002  BP: (!) 147/85  Pulse: 66  Resp: 18  Weight: 178 lb 9.6 oz (81 kg)  Height: 5' 5 (1.651 m)   Body mass index is 29.72 kg/m. Gen: WD/WN, NAD Head: [place]/AT, No temporalis wasting.  Ear/Nose/Throat: Hearing grossly intact, nares w/o erythema or drainage, pinna without lesions Eyes: PER, EOMI, sclera nonicteric.  Neck: Supple, no gross masses.  No JVD.  Pulmonary:  Good air movement, no audible wheezing, no use of accessory muscles.  Cardiac: RRR, precordium not hyperdynamic. Vascular:  large varicosities present right leg >10 mm.  Veins are tender to palpation.  Moderate venous stasis changes to the legs bilaterally.  2+ soft pitting edema. CEAP [id number]   Vessel Right Left  Radial Palpable Palpable  Gastrointestinal: soft, non-distended. No guarding/no peritoneal signs.  Musculoskeletal: M/S 5/5 throughout.  No deformity.  Neurologic: CN 2-12 intact. Pain and light touch intact in extremities.   Symmetrical.  Speech is fluent. Motor exam as listed above. Psychiatric: Judgment intact, Mood & affect appropriate for pt's clinical situation. Dermatologic: Venous rashes no ulcers noted.  No changes consistent with cellulitis. Lymph : No lichenification or skin changes of chronic lymphedema.  CBC Lab Results  Component Value Date   WBC 5.3 07/08/2024   HGB 12.0 07/08/2024   HCT 37.7 07/08/2024   MCV 89.3 07/08/2024   PLT 236 07/08/2024    BMET    Component Value Date/Time   NA 140 07/08/2024 0829   NA 142 10/26/2015 0947   K 4.3 07/08/2024 0829   CL 105 07/08/2024 0829   CO2 28 07/08/2024 0829   GLUCOSE 87 07/08/2024 0829   BUN 13 07/08/2024 0829   BUN 14 10/26/2015 0947   CREATININE 0.68 07/08/2024 0829  CALCIUM  9.4 07/08/2024 0829   GFRNONAA >60 08/03/2021 0610   GFRAA >60 04/16/2017 1420   CrCl cannot be calculated (Patient's most recent lab result is older than the maximum 21 days allowed.).  COAG No results found for: INR, PROTIME  Radiology No results found.   Assessment/Plan 1. Varicose veins of both lower extremities with pain  Recommend:  The patient is complaining of symptomatic varicose veins.  The patient describes them as painful, associated with swelling of both feet and ankles.  The patient notes  this is interfering with daily activities and lifestyle.  I have had a long discussion with the patient regarding  varicose veins and why they cause symptoms.  Patient will wear graduated compression stockings class 1 on a daily basis, beginning first thing in the morning and removing them in the evening. The patient is instructed specifically not to sleep in the stockings.    The patient  will also begin using over-the-counter analgesics such as Motrin 600 mg po TID to help control the symptoms.    In addition, behavioral modification including elevation during the day will be initiated.  The patient is also instructed to continue exercising, such as  walking, 4-5 times per week.  Pending the results of these changes the  patient will be reevaluated in three months at which time an  ultrasound of the venous system can be obtained if the symptoms are persistent.  Further plans will be based on the benefits of conservative therapy and the ultrasound results.  - VAS US  LOWER EXTREMITY VENOUS REFLUX; Future  2. Chronic venous insufficiency  Recommend:  The patient is complaining of symptomatic varicose veins.  The patient describes them as painful, associated with swelling of both feet and ankles.  The patient notes  this is interfering with daily activities and lifestyle.  I have had a long discussion with the patient regarding  varicose veins and why they cause symptoms.  Patient will wear graduated compression stockings class 1 on a daily basis, beginning first thing in the morning and removing them in the evening. The patient is instructed specifically not to sleep in the stockings.    The patient  will also begin using over-the-counter analgesics such as Motrin 600 mg po TID to help control the symptoms.    In addition, behavioral modification including elevation during the day will be initiated.  The patient is also instructed to continue exercising, such as walking, 4-5 times per week.  Pending the results of these changes the  patient will be reevaluated in three months at which time an  ultrasound of the venous system can be obtained if the symptoms are persistent.  Further plans will be based on the benefits of conservative therapy and the ultrasound results.  - VAS US  LOWER EXTREMITY VENOUS REFLUX; Future  3. Lymphedema (Primary) Recommend:  I have had a long discussion with the patient regarding swelling and why it  causes symptoms.  Patient will begin wearing graduated compression on a daily basis a prescription was given. The patient will  wear the stockings first thing in the morning and removing them in the evening. The patient  is instructed specifically not to sleep in the stockings.   In addition, behavioral modification will be initiated.  This will include frequent elevation, use of over the counter pain medications and exercise such as walking.  Consideration for a lymph pump will also be made based upon the effectiveness of conservative therapy.  This would help to improve  the edema control and prevent sequela such as ulcers and infections   Patient should undergo duplex ultrasound of the venous system to ensure that DVT or reflux is not present.  The patient will follow-up with me after the ultrasound.   4. Dyslipidemia Continue statin as ordered and reviewed, no changes at this time    Cordella Shawl, MD  09/15/2024 5:20 PM

## 2024-09-16 DIAGNOSIS — J301 Allergic rhinitis due to pollen: Secondary | ICD-10-CM | POA: Diagnosis not present

## 2024-09-16 DIAGNOSIS — J3089 Other allergic rhinitis: Secondary | ICD-10-CM | POA: Diagnosis not present

## 2024-09-16 DIAGNOSIS — J3081 Allergic rhinitis due to animal (cat) (dog) hair and dander: Secondary | ICD-10-CM | POA: Diagnosis not present

## 2024-09-23 DIAGNOSIS — J3081 Allergic rhinitis due to animal (cat) (dog) hair and dander: Secondary | ICD-10-CM | POA: Diagnosis not present

## 2024-09-23 DIAGNOSIS — J301 Allergic rhinitis due to pollen: Secondary | ICD-10-CM | POA: Diagnosis not present

## 2024-09-23 DIAGNOSIS — J3089 Other allergic rhinitis: Secondary | ICD-10-CM | POA: Diagnosis not present

## 2024-09-30 DIAGNOSIS — J301 Allergic rhinitis due to pollen: Secondary | ICD-10-CM | POA: Diagnosis not present

## 2024-09-30 DIAGNOSIS — J3081 Allergic rhinitis due to animal (cat) (dog) hair and dander: Secondary | ICD-10-CM | POA: Diagnosis not present

## 2024-09-30 DIAGNOSIS — J3089 Other allergic rhinitis: Secondary | ICD-10-CM | POA: Diagnosis not present

## 2024-10-07 DIAGNOSIS — J3089 Other allergic rhinitis: Secondary | ICD-10-CM | POA: Diagnosis not present

## 2024-10-07 DIAGNOSIS — J3081 Allergic rhinitis due to animal (cat) (dog) hair and dander: Secondary | ICD-10-CM | POA: Diagnosis not present

## 2024-10-07 DIAGNOSIS — J301 Allergic rhinitis due to pollen: Secondary | ICD-10-CM | POA: Diagnosis not present

## 2024-10-16 ENCOUNTER — Other Ambulatory Visit (INDEPENDENT_AMBULATORY_CARE_PROVIDER_SITE_OTHER): Payer: Self-pay | Admitting: Vascular Surgery

## 2024-10-16 DIAGNOSIS — M79672 Pain in left foot: Secondary | ICD-10-CM | POA: Diagnosis not present

## 2024-10-16 DIAGNOSIS — M79671 Pain in right foot: Secondary | ICD-10-CM | POA: Diagnosis not present

## 2024-10-16 DIAGNOSIS — I83813 Varicose veins of bilateral lower extremities with pain: Secondary | ICD-10-CM

## 2024-10-16 DIAGNOSIS — I872 Venous insufficiency (chronic) (peripheral): Secondary | ICD-10-CM

## 2024-10-16 DIAGNOSIS — M722 Plantar fascial fibromatosis: Secondary | ICD-10-CM | POA: Diagnosis not present

## 2024-10-16 DIAGNOSIS — K219 Gastro-esophageal reflux disease without esophagitis: Secondary | ICD-10-CM | POA: Diagnosis not present

## 2024-10-16 DIAGNOSIS — R252 Cramp and spasm: Secondary | ICD-10-CM | POA: Diagnosis not present

## 2024-10-16 NOTE — Progress Notes (Signed)
 Chief Complaint:   Chief Complaint  Patient presents with  . Follow-up    Bilateral plantar fasciitis left is still giving patient problems     Subjective  HPI  Dana Hensley  is a 63 y.o. female who presents for Follow-up (Bilateral plantar fasciitis left is still giving patient problems ) History of Present Illness Dana  Hensley is a 63 year old female who presents with bilateral foot pain due to plantar fasciitis.  She has been experiencing bilateral foot pain, with the left foot being more painful than the right. The pain is described as shooting and located on the lateral aspect of the foot, sometimes radiating into the arch. Although both feet are still improved compared to previous levels, the pain has increased over the last ten days, particularly in the left foot.  She engages in regular walking and exercises, which she was able to do without significant issues until the recent increase in pain. She experiences muscle cramps in her legs and feet at night. Stretching exercises have been helpful, but the cramps persist.  She does not take any medication for inflammation and has no history of kidney issues. She mentions a past tumor on her stomach, which has caused some stomach issues. She tends to drink more tea than water.  Review of Systems  Patient Active Problem List  Diagnosis  . Carcinoid tumor of stomach (CMS-HCC)  . Hypothyroidism (acquired), unspecified  . IGT (impaired glucose tolerance)  . Allergic rhinitis  . Vitamin B12 deficiency  . Hyperlipidemia, unspecified  . Intrinsic atopic dermatitis  . GERD (gastroesophageal reflux disease)  . Barrett's esophagus without dysplasia  . Autoimmune Metaplasic Atrophic Gastritis  . History of adenomatous polyp of colon  . Coronary artery calcification  . Aortic atherosclerosis  . Leg pain    Outpatient Medications Prior to Visit  Medication Sig Dispense Refill  . betamethasone dipropionate, augmented, (DIPROLENE) 0.05 %  ointment Apply topically as needed    . cetirizine (ZYRTEC) 10 mg capsule Take 1 capsule (10 mg total) by mouth every morning    . clobetasoL (TEMOVATE) 0.05 % ointment Apply topically 2 (two) times daily    . cyanocobalamin  (VITAMIN B12) 1,000 mcg/mL injection Inject 1 mL (1,000 mcg total) into the muscle monthly 1 mL 2  . cyanocobalamin  (VITAMIN B12) 1000 MCG tablet Take 1 tablet (1,000 mcg total) by mouth every morning    . dicyclomine  (BENTYL ) 10 mg capsule Take 1 capsule (10 mg total) by mouth 4 (four) times daily as needed    . EPINEPHrine (EPIPEN) 0.3 mg/0.3 mL pen injector Inject 0.3 mg into the muscle once as needed  0  . fluticasone  propionate (FLONASE ) 50 mcg/actuation nasal spray Place 2 sprays into both nostrils once daily as needed    . ipratropium (ATROVENT) 21 mcg (0.03 %) nasal spray Place 1 spray into both nostrils once daily as needed    . ketotifen (ZADITOR) 0.025 % (0.035 %) ophthalmic solution 1 drop 2 (two) times daily    . levocetirizine (XYZAL) 5 MG tablet Take 5 mg by mouth every evening    . levothyroxine  (SYNTHROID ) 75 MCG tablet TAKE 1 TABLET BY MOUTH EVERY DAY 30-60 MINS BEFORE BREAKFAST ON AN EMPTY STOMACH AND WITH A GLASS OF WATER 90 tablet 0  . mometasone (ELOCON) 0.1 % ointment Apply topically once daily    . multivitamin tablet Take 1 tablet by mouth every morning    . pimecrolimus (ELIDEL) 1 % cream Apply topically once daily    .  rosuvastatin  (CRESTOR ) 10 MG tablet TAKE 1 TABLET (10 MG) BY MOUTH EVERY DAY 30 tablet 0  . syringe with needle 1 mL 27 gauge x 3/8 Syrg every Wednesday Patient receives allergy shot once a week.      . syringe with needle, safety 3 mL 23 gauge x 1 Syrg Use 1 each monthly 3 each 0  . VITAMIN D3 ORAL Take 1 capsule by mouth every morning     No facility-administered medications prior to visit.      Objective  Vitals:   10/16/24 0843  BP: 128/80  Weight: 81.6 kg (180 lb)  Height: 165.1 cm (5' 5)  PainSc:   5  PainLoc:  Foot   Body mass index is 29.95 kg/m.  Home Vitals:     Physical Exam   Pain with palpation of the plantar fascia insertion bilaterally.  No palpable defects.  She does rate her pain is improved from her last visit but still uncomfortable.  She is complaining of subjective muscle cramping.  She has tried magnesium  as well as tonic water for this.   The following labs were personally reviewed: - Lab Results  Component Value Date   WBC 6.7 02/06/2022   HGB 12.2 02/06/2022   HCT 39.1 02/06/2022   PLT 234 02/06/2022   - Lab Results  Component Value Date   NA 140 11/15/2021   K 4.2 11/15/2021   CL 104 11/15/2021   CO2 30.5 11/15/2021   BUN 13 11/15/2021   CREATININE 0.6 11/15/2021   GLUCOSE 87 11/15/2021   - Lab Results  Component Value Date   NA 140 11/15/2021   K 4.2 11/15/2021   CL 104 11/15/2021   CO2 30.5 11/15/2021   BUN 13 11/15/2021   CREATININE 0.6 11/15/2021   CALCIUM  9.2 11/15/2021   ALB 4.1 11/15/2021   TBILI 0.3 11/15/2021   ALKPHOS 74 11/15/2021   AST 13 11/15/2021   ALT 12 11/15/2021   GLUCOSE 87 11/15/2021   GFR 102 11/15/2021   - Lab Results  Component Value Date   HGBA1C 6.0 (H) 11/15/2021      Results      Assessment/Plan:   Assessment & Plan Bilateral plantar fasciitis Chronic bilateral plantar fasciitis with recent increase in pain, particularly in the left foot. Pain is located on the lateral aspect of the foot with shooting pain into the arch. Improvement noted compared to previous episodes, but left foot remains more symptomatic than the right. - Administered cortisone injection to both feet, focusing on the left foot. - Continue stretching exercises. - Use supportive shoes.  Plantar fascial injection performed today. Injected the both feet at plantar fascial insertion medially with 4mg  dexamethasone , 20mg  kenalog 40, and 1 cc of lidocaine  plain. Pt tolerated procedure well.    Muscle cramps Experiencing muscle  cramps, particularly at night, possibly secondary to altered gait due to plantar fasciitis. Potential contributing factors include dehydration and electrolyte imbalance. - Prescribed Flexeril  (methocarbamol) to be taken at night as a muscle relaxer. - Advised increased water intake to prevent dehydration and potential electrolyte imbalance.  Diagnoses and all orders for this visit:  Heel pain, bilateral  Plantar fasciitis  Gastroesophageal reflux disease, unspecified whether esophagitis present  Nocturnal muscle cramp  Other orders -     cyclobenzaprine  (FLEXERIL ) 10 MG tablet; Take 1 tablet (10 mg total) by mouth at bedtime for 10 days          Future Appointments     Date/Time Provider  Department Center Visit Type   03/04/2025 12:45 PM NURSE PREOP TELEPHONE SCREEN-1 [facility] NURSE PHONE CALL       There are no Patient Instructions on file for this visit.   This note has been created using automated tools and reviewed for accuracy by JUSTIN ALLEN FOWLER.

## 2024-10-17 DIAGNOSIS — J3081 Allergic rhinitis due to animal (cat) (dog) hair and dander: Secondary | ICD-10-CM | POA: Diagnosis not present

## 2024-10-17 DIAGNOSIS — J301 Allergic rhinitis due to pollen: Secondary | ICD-10-CM | POA: Diagnosis not present

## 2024-10-17 DIAGNOSIS — J3089 Other allergic rhinitis: Secondary | ICD-10-CM | POA: Diagnosis not present

## 2024-10-20 ENCOUNTER — Encounter (INDEPENDENT_AMBULATORY_CARE_PROVIDER_SITE_OTHER): Payer: Self-pay | Admitting: Vascular Surgery

## 2024-10-20 ENCOUNTER — Ambulatory Visit (INDEPENDENT_AMBULATORY_CARE_PROVIDER_SITE_OTHER): Admitting: Vascular Surgery

## 2024-10-20 ENCOUNTER — Ambulatory Visit (INDEPENDENT_AMBULATORY_CARE_PROVIDER_SITE_OTHER)

## 2024-10-20 VITALS — BP 142/83 | HR 60 | Resp 18 | Wt 181.2 lb

## 2024-10-20 DIAGNOSIS — E785 Hyperlipidemia, unspecified: Secondary | ICD-10-CM | POA: Diagnosis not present

## 2024-10-20 DIAGNOSIS — I872 Venous insufficiency (chronic) (peripheral): Secondary | ICD-10-CM

## 2024-10-20 DIAGNOSIS — I890 Lymphedema, not elsewhere classified: Secondary | ICD-10-CM | POA: Diagnosis not present

## 2024-10-20 DIAGNOSIS — I83813 Varicose veins of bilateral lower extremities with pain: Secondary | ICD-10-CM

## 2024-10-20 NOTE — Progress Notes (Signed)
 MRN : [id number]  Dana Hensley is a 63 y.o. (03-21-1961) female who presents with chief complaint of varicose veins hurt.  History of Present Illness:   The patient returns for followup evaluation 3 months after the initial visit. The patient continues to have pain in the lower extremities with dependency. The pain is lessened with elevation. Graduated compression stockings, Class I (20-30 mmHg), have been worn but the stockings do not eliminate the leg pain. Over-the-counter analgesics do not improve the symptoms. The degree of discomfort continues to interfere with daily activities. The patient notes the pain in the legs is causing problems with daily exercise, at the workplace and even with household activities and maintenance such as standing in the kitchen preparing meals and doing dishes.   Venous ultrasound shows normal deep venous system, no evidence of acute or chronic DVT.  Superficial reflux is present in the right small saphenous vein but this vein measures 1.7 mm.  There is also a incompetent perforating vein that appears to be the source of the symptomatic veins in her right calf.  On the left superficial reflux is present in the great saphenous vein from the distal calf to the mid thigh and the vein is up to 7 mm in diameter.  Current Meds  Medication Sig   cyanocobalamin  (VITAMIN B12) 1000 MCG tablet Take by mouth.   cyanocobalamin  (VITAMIN B12) 1000 MCG/ML injection Inject 1 mL (1,000 mcg total) into the muscle every 30 (thirty) days.   dicyclomine  (BENTYL ) 10 MG capsule Take by mouth.   fluticasone  (FLONASE ) 50 MCG/ACT nasal spray Place 2 sprays into both nostrils daily.   ipratropium (ATROVENT) 0.03 % nasal spray Place 1 spray into both nostrils daily as needed.   levocetirizine (XYZAL) 5 MG tablet Take 5 mg by mouth every evening.   levothyroxine  (SYNTHROID ) 75 MCG tablet Take 1 tablet (75 mcg total) by mouth daily before breakfast.   montelukast (SINGULAIR)  10 MG tablet Take 10 mg by mouth at bedtime.   Multiple Vitamin (MULTI-VITAMINS) TABS Take 1 tablet by mouth daily.    rosuvastatin  (CRESTOR ) 10 MG tablet Take 1 tablet (10 mg total) by mouth daily.   Tuberculin-Allergy Syringes (BD ALLERGY SYRINGE) 27G X 3/8 1 ML MISC every Wednesday Patient receives allergy shot once a week.    Past Medical History:  Diagnosis Date   Allergy    Anxiety    Asthma    B12 deficiency    Barrett esophagus    Chronic atrophic gastritis    DVT, lower extremity (HCC)    Gastric polyps    HLD (hyperlipidemia)    Hypothyroidism    IGT (impaired glucose tolerance)    Neuroendocrine tumor (HCC)    carcinoid tumors of the stomach, s/p extraction surgery at [facility]   PONV (postoperative nausea and vomiting)    Vitamin D  deficiency     Past Surgical History:  Procedure Laterality Date   APPENDECTOMY     CHOLECYSTECTOMY N/A 11/16/2016   Procedure: LAPAROSCOPIC CHOLECYSTECTOMY WITH INTRAOPERATIVE CHOLANGIOGRAM;  Surgeon: Larinda Unknown Sharps, MD;  Location: [facility];  Service: General;  Laterality: N/A;   COLONOSCOPY WITH PROPOFOL  N/A 04/21/2021   Procedure: COLONOSCOPY WITH PROPOFOL ;  Surgeon: Jinny Carmine, MD;  Location: [facility];  Service: Endoscopy;  Laterality: N/A;   DIAGNOSTIC LAPAROSCOPY     ESOPHAGOGASTRODUODENOSCOPY     TONSILLECTOMY     TOTAL ABDOMINAL HYSTERECTOMY W/ BILATERAL  SALPINGOOPHORECTOMY  2012   due to ovar cysts/pain   VEIN LIGATION AND STRIPPING      Social History Social History   Tobacco Use   Smoking status: Never   Smokeless tobacco: Never  Vaping Use   Vaping status: Never Used  Substance Use Topics   Alcohol use: No    Alcohol/week: 0.0 standard drinks of alcohol   Drug use: No    Family History Family History  Problem Relation Age of Onset   Clotting disorder Mother    Hyperlipidemia Father    Breast cancer Neg Hx     Allergies  Allergen Reactions   Latex Swelling    SWELLING AROUND FACE   Neomycin  Other (See Comments) and Rash    Seen from allergy skin test Seen from allergy skin test   Neosporin  [Neomycin-Bacitracin Zn-Polymyx] Other (See Comments)    Seen from allergy skin test   Neomycin-Polymyxin-Gramicidin     Other reaction(s): Unknown Seen from allergy skin test   Other Other (See Comments)    THIMERSOL. THIMERSOL- seen from allergy skin test  Other reaction(s): Unknown     REVIEW OF SYSTEMS (Negative unless checked)  Constitutional: [] Weight loss  [] Fever  [] Chills Cardiac: [] Chest pain   [] Chest pressure   [] Palpitations   [] Shortness of breath when laying flat   [] Shortness of breath with exertion. Vascular:  [] Pain in legs with walking   [x] Pain in legs with standing  [] History of DVT   [] Phlebitis   [] Swelling in legs   [x] Varicose veins   [] Non-healing ulcers Pulmonary:   [] Uses home oxygen   [] Productive cough   [] Hemoptysis   [] Wheeze  [] COPD   [] Asthma Neurologic:  [] Dizziness   [] Seizures   [] History of stroke   [] History of TIA  [] Aphasia   [] Vissual changes   [] Weakness or numbness in arm   [] Weakness or numbness in leg Musculoskeletal:   [] Joint swelling   [] Joint pain   [] Low back pain Hematologic:  [] Easy bruising  [] Easy bleeding   [] Hypercoagulable state   [] Anemic Gastrointestinal:  [] Diarrhea   [] Vomiting  [] Gastroesophageal reflux/heartburn   [] Difficulty swallowing. Genitourinary:  [] Chronic kidney disease   [] Difficult urination  [] Frequent urination   [] Blood in urine Skin:  [] Rashes   [] Ulcers  Psychological:  [] History of anxiety   []  History of major depression.  Physical Examination  Vitals:   10/20/24 0928  BP: (!) 142/83  Pulse: 60  Resp: 18  Weight: 181 lb 3.2 oz (82.2 kg)   Body mass index is 30.15 kg/m. Gen: WD/WN, NAD Head: [place]/AT, No temporalis wasting.  Ear/Nose/Throat: Hearing grossly intact, nares w/o erythema or drainage, pinna without lesions Eyes: PER, EOMI, sclera nonicteric.  Neck: Supple, no gross masses.  No JVD.   Pulmonary:  Good air movement, no audible wheezing, no use of accessory muscles.  Cardiac: RRR, precordium not hyperdynamic. Vascular:  Large varicosities present, greater than 10 mm bilateral.  Veins are tender to palpation  Mild venous stasis changes to the legs bilaterally.  Trace soft pitting edema CEAP C3sEpAsPr Vessel Right Left  Radial Palpable Palpable  Gastrointestinal: soft, non-distended. No guarding/no peritoneal signs.  Musculoskeletal: M/S 5/5 throughout.  No deformity.  Neurologic: CN 2-12 intact. Pain and light touch intact in extremities.  Symmetrical.  Speech is fluent. Motor exam as listed above. Psychiatric: Judgment intact, Mood & affect appropriate for pt's clinical situation. Dermatologic: Venous rashes no ulcers noted.  No changes consistent with cellulitis. Lymph : No lichenification or  skin changes of chronic lymphedema.  CBC Lab Results  Component Value Date   WBC 5.3 07/08/2024   HGB 12.0 07/08/2024   HCT 37.7 07/08/2024   MCV 89.3 07/08/2024   PLT 236 07/08/2024    BMET    Component Value Date/Time   NA 140 07/08/2024 0829   NA 142 10/26/2015 0947   K 4.3 07/08/2024 0829   CL 105 07/08/2024 0829   CO2 28 07/08/2024 0829   GLUCOSE 87 07/08/2024 0829   BUN 13 07/08/2024 0829   BUN 14 10/26/2015 0947   CREATININE 0.68 07/08/2024 0829   CALCIUM  9.4 07/08/2024 0829   GFRNONAA >60 08/03/2021 0610   GFRAA >60 04/16/2017 1420   CrCl cannot be calculated (Patient's most recent lab result is older than the maximum 21 days allowed.).  COAG No results found for: INR, PROTIME  Radiology No results found.   Assessment/Plan 1. Varicose veins of both lower extremities with pain (Primary) Recommend  I have reviewed my previous  discussion with the patient regarding  varicose veins and why they cause symptoms. Patient will continue  wearing graduated compression stockings class 1 on a daily basis, beginning first thing in the morning and removing  them in the evening.  The patient is CEAP C3sEpAsPr.  The patient has been wearing compression for more than 12 weeks with no or little benefit.  She actually finds that compression causes her increased pain and worsens her swelling in the knee area.  The patient has been exercising daily for more than 12 weeks. The patient has been elevating and taking OTC pain medications for more than 12 weeks.  None of these have have eliminated the pain related to the varicose veins and venous reflux or the discomfort regarding venous congestion.    In addition, behavioral modification including elevation during the day was again discussed and this will continue.  The patient has utilized over the counter pain medications and has been exercising.  However, at this time conservative therapy has not alleviated the patient's symptoms of leg pain and swelling  Recommend: laser ablation of the  left great saphenous vein to eliminate the symptoms of pain and swelling of the lower extremities caused by the severe superficial venous reflux disease.  For treatment of the right lower extremity given the findings noted in the duplex ultrasound particularly with respect to the small size of the small saphenous vein as well as an incompetent perforator I believe ultrasound-guided foam sclerotherapy is the most appropriate treatment.  2. Chronic venous insufficiency No surgery or intervention at this point in time.   The patient is CEAP C4sEpAsPr   I have discussed with the patient venous insufficiency and why it  causes symptoms. I have discussed with the patient the chronic skin changes that accompany venous insufficiency and the long term sequela such as infection and ulceration.  Patient will begin wearing graduated compression stockings or compression wraps on a daily basis.  The patient will put the compression on first thing in the morning and removing them in the evening. The patient is instructed specifically not to  sleep in the compression.    In addition, behavioral modification including several periods of elevation of the lower extremities during the day will be continued. I have demonstrated that proper elevation is a position with the ankles at heart level.  The patient is instructed to begin routine exercise, especially walking on a daily basis  The patient will be assessed for a Lymph Pump depending  on the effectiveness of conservative therapy and the control of the associated lymphedema.  3. Lymphedema No surgery or intervention at this point in time.   The patient is CEAP C4sEpAsPr   I have discussed with the patient venous insufficiency and why it  causes symptoms. I have discussed with the patient the chronic skin changes that accompany venous insufficiency and the long term sequela such as infection and ulceration.  Patient will begin wearing graduated compression stockings or compression wraps on a daily basis.  The patient will put the compression on first thing in the morning and removing them in the evening. The patient is instructed specifically not to sleep in the compression.    In addition, behavioral modification including several periods of elevation of the lower extremities during the day will be continued. I have demonstrated that proper elevation is a position with the ankles at heart level.  The patient is instructed to begin routine exercise, especially walking on a daily basis  The patient will be assessed for a Lymph Pump depending on the effectiveness of conservative therapy and the control of the associated lymphedema.  4. Dyslipidemia Continue statin as ordered and reviewed, no changes at this time    Cordella Shawl, MD  10/20/2024 9:53 AM

## 2024-10-28 ENCOUNTER — Ambulatory Visit: Admitting: Family Medicine

## 2024-10-30 DIAGNOSIS — J3081 Allergic rhinitis due to animal (cat) (dog) hair and dander: Secondary | ICD-10-CM | POA: Diagnosis not present

## 2024-10-30 DIAGNOSIS — J3089 Other allergic rhinitis: Secondary | ICD-10-CM | POA: Diagnosis not present

## 2024-10-30 DIAGNOSIS — J301 Allergic rhinitis due to pollen: Secondary | ICD-10-CM | POA: Diagnosis not present

## 2024-11-05 DIAGNOSIS — J3089 Other allergic rhinitis: Secondary | ICD-10-CM | POA: Diagnosis not present

## 2024-11-05 DIAGNOSIS — J3081 Allergic rhinitis due to animal (cat) (dog) hair and dander: Secondary | ICD-10-CM | POA: Diagnosis not present

## 2024-11-05 DIAGNOSIS — J301 Allergic rhinitis due to pollen: Secondary | ICD-10-CM | POA: Diagnosis not present

## 2024-11-07 DIAGNOSIS — J301 Allergic rhinitis due to pollen: Secondary | ICD-10-CM | POA: Diagnosis not present

## 2024-11-07 DIAGNOSIS — Z419 Encounter for procedure for purposes other than remedying health state, unspecified: Secondary | ICD-10-CM | POA: Diagnosis not present

## 2024-11-07 DIAGNOSIS — J3081 Allergic rhinitis due to animal (cat) (dog) hair and dander: Secondary | ICD-10-CM | POA: Diagnosis not present

## 2024-11-07 DIAGNOSIS — J3089 Other allergic rhinitis: Secondary | ICD-10-CM | POA: Diagnosis not present

## 2024-11-14 DIAGNOSIS — J3081 Allergic rhinitis due to animal (cat) (dog) hair and dander: Secondary | ICD-10-CM | POA: Diagnosis not present

## 2024-11-14 DIAGNOSIS — J3089 Other allergic rhinitis: Secondary | ICD-10-CM | POA: Diagnosis not present

## 2024-11-14 DIAGNOSIS — J301 Allergic rhinitis due to pollen: Secondary | ICD-10-CM | POA: Diagnosis not present

## 2024-11-22 ENCOUNTER — Encounter: Payer: Self-pay | Admitting: Family Medicine

## 2024-11-22 DIAGNOSIS — G8929 Other chronic pain: Secondary | ICD-10-CM

## 2024-11-22 DIAGNOSIS — M51362 Other intervertebral disc degeneration, lumbar region with discogenic back pain and lower extremity pain: Secondary | ICD-10-CM

## 2024-11-22 DIAGNOSIS — E538 Deficiency of other specified B group vitamins: Secondary | ICD-10-CM

## 2024-11-22 DIAGNOSIS — M5134 Other intervertebral disc degeneration, thoracic region: Secondary | ICD-10-CM

## 2024-11-22 DIAGNOSIS — M79671 Pain in right foot: Secondary | ICD-10-CM

## 2024-11-24 MED ORDER — "SYRINGE/NEEDLE (DISP) 25G X 1"" 1 ML MISC": 3 refills | Status: AC

## 2024-11-24 MED ORDER — CYANOCOBALAMIN 1000 MCG/ML IJ SOLN: 1000.0000 ug | INTRAMUSCULAR | 3 refills | Status: AC

## 2024-12-12 ENCOUNTER — Ambulatory Visit: Admitting: Family Medicine

## 2024-12-12 ENCOUNTER — Encounter: Payer: Self-pay | Admitting: Family Medicine

## 2024-12-12 VITALS — BP 128/88 | HR 84 | Ht 65.0 in | Wt 187.0 lb

## 2024-12-12 DIAGNOSIS — I83813 Varicose veins of bilateral lower extremities with pain: Secondary | ICD-10-CM

## 2024-12-12 DIAGNOSIS — E559 Vitamin D deficiency, unspecified: Secondary | ICD-10-CM | POA: Diagnosis not present

## 2024-12-12 DIAGNOSIS — L2084 Intrinsic (allergic) eczema: Secondary | ICD-10-CM

## 2024-12-12 DIAGNOSIS — R252 Cramp and spasm: Secondary | ICD-10-CM

## 2024-12-12 DIAGNOSIS — M51362 Other intervertebral disc degeneration, lumbar region with discogenic back pain and lower extremity pain: Secondary | ICD-10-CM

## 2024-12-12 DIAGNOSIS — I872 Venous insufficiency (chronic) (peripheral): Secondary | ICD-10-CM | POA: Diagnosis not present

## 2024-12-12 DIAGNOSIS — M79671 Pain in right foot: Secondary | ICD-10-CM

## 2024-12-12 DIAGNOSIS — E039 Hypothyroidism, unspecified: Secondary | ICD-10-CM | POA: Diagnosis not present

## 2024-12-12 DIAGNOSIS — E538 Deficiency of other specified B group vitamins: Secondary | ICD-10-CM | POA: Diagnosis not present

## 2024-12-12 DIAGNOSIS — I890 Lymphedema, not elsewhere classified: Secondary | ICD-10-CM | POA: Diagnosis not present

## 2024-12-12 DIAGNOSIS — M79672 Pain in left foot: Secondary | ICD-10-CM

## 2024-12-12 MED ORDER — GABAPENTIN 300 MG PO CAPS: 300.0000 mg | ORAL_CAPSULE | Freq: Every day | ORAL | 0 refills | Status: AC

## 2024-12-12 NOTE — Progress Notes (Signed)
 "  Subjective:    Patient ID: Dana  I Hensley, female    DOB: May 28, 1961, 64 y.o.   MRN: [id number]  Dana  I Hensley is a 64 y.o. female presenting on 12/12/2024 for Back Pain (Patient states she is having feet and back pain. Concerned about her BP being high.)  Patient presents for a same day appointment. Scheduled on MyChart, however today discussion was focused on Chronic Problems, not acute.  HPI  Discussed the use of AI scribe software for clinical note transcription with the patient, who gave verbal consent to proceed.  History of Present Illness   Dana  I Hensley is a 64 year old female who presents with persistent back and foot pain.  Chronic Foot Pain Bilateral - Persistent back and foot pain for almost four years. - Extensive work up in past including podiatry, neurology neurosurgery, vascular - She was recently referred to [facility] for 2nd opinion on feet. - Foot pain is chronic, not extremely severe, but has not resolved despite various treatments. - Received two injections in her feet, each providing relief for 1 to 1.5 months before pain recurred. 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 - Pain has progressively worsened over time. - She believes activity helps pain but seems that pain does inc with increased activity - Has tried physical therapy, massage, chiropractic care, and exercises without significant improvement. - Gabapentin  100mg  at night per neurology at a low dose was trialed without significant benefit.  Venous insufficiency and complications Lymphedema / Varicose veins - History of vein problems for approximately forty years, similar to her mother's history. Followed by [facility] Dr Jama needs referral - Undergone four leg surgeries, sclerotherapy, and laser treatments (last treatment five years ago). - Venous issues have recently worsened; awaiting  insurance approval for further procedures. - Experiences muscle cramps, particularly at night.  Vitamin deficiency and associated symptoms - Known issue with vitamin B12 absorption; takes lifelong supplementation. - Last blood panel approximately one year ago showed normal B12 and vitamin D  levels. - Experiences muscle cramps and fatigue, especially in the evenings after caring for grandchildren.  Elevated BP - Episodes of high blood pressure, which are unusual for her. - Uses lisinopril to manage these episodes. From husband         12/12/2024   11:18 AM 07/07/2024    1:17 PM 01/18/2024   10:00 AM  Depression screen PHQ 2/9  Decreased Interest 0 0 0  Down, Depressed, Hopeless 0 0 0  PHQ - 2 Score 0 0 0  Altered sleeping 2 0 2  Tired, decreased energy 1 1 0  Change in appetite 0    Feeling bad or failure about yourself  0 0 0  Trouble concentrating 0 0 0  Moving slowly or fidgety/restless 0 0 0  Suicidal thoughts 0 0 0  PHQ-9 Score 3 1  2    Difficult doing work/chores Not difficult at all Not difficult at all Not difficult at all     Data saved with a previous flowsheet row definition       12/12/2024   11:19 AM 07/07/2024    1:18 PM 01/18/2024   10:00 AM 07/24/2023    2:10 PM  GAD 7 : Generalized Anxiety Score  Nervous, Anxious, on Edge 0 0 0 0  Control/stop worrying 0 0 0 0  Worry too much - different things 1 1 1 1   Trouble  relaxing 0 0 1 0  Restless 0 0 0 0  Easily annoyed or irritable 0 0 0 1  Afraid - awful might happen 0 0 0 1  Total GAD 7 Score 1 1 2 3   Anxiety Difficulty Not difficult at all Not difficult at all  Not difficult at all    Social History[1]  Review of Systems Per HPI unless specifically indicated above     Objective:    BP 128/88 (BP Location: Left Arm, Cuff Size: Normal)   Pulse 84   Ht 5' 5 (1.651 m)   Wt 187 lb (84.8 kg)   SpO2 96%   BMI 31.12 kg/m   Wt Readings from Last 3 Encounters:  12/12/24 187 lb (84.8 kg)  10/20/24 181  lb 3.2 oz (82.2 kg)  09/08/24 178 lb 9.6 oz (81 kg)    Physical Exam Vitals and nursing note reviewed.  Constitutional:      General: She is not in acute distress.    Appearance: Normal appearance. She is well-developed. She is not diaphoretic.     Comments: Well-appearing, comfortable, cooperative  HENT:     Head: Normocephalic and atraumatic.  Eyes:     General:        Right eye: No discharge.        Left eye: No discharge.     Conjunctiva/sclera: Conjunctivae normal.  Cardiovascular:     Rate and Rhythm: Normal rate.  Pulmonary:     Effort: Pulmonary effort is normal.  Musculoskeletal:     Right lower leg: Edema (varicose veins present bilateral) present.     Left lower leg: Edema present.  Skin:    General: Skin is warm and dry.     Findings: No erythema or rash.  Neurological:     Mental Status: She is alert and oriented to person, place, and time.  Psychiatric:        Mood and Affect: Mood normal.        Behavior: Behavior normal.        Thought Content: Thought content normal.     Comments: Well groomed, good eye contact, normal speech and thoughts     I have personally reviewed the radiology report from 08/14/24 on Foot X-ray.  X-ray calcaneus left minimum 2 views  Anatomical Region Laterality Modality  Foot Left -- Radiographic Imaging  Toe Left -- --  Ankle Left -- --  ORTHO Foot Left -- --   Narrative  EXAM: X-ray calcaneus left minimum 2 views  INDICATION:  Heel pain, bilateral [M79.671, F20.327 (ICD-10-CM)]   TECHNIQUE AND FINDINGS: Lateral and calcaneal axial of the left calcaneus reveals no signs of acute fracture or space-occupying lesions.  Subtalar joint is well maintained.  No space-occupying lesions are noted.  No varus or valgus of the calcaneus on the axial view.  Remainder of the calcaneal x-ray is negative.    JUSTIN ALLEN FOWLER, DPM Exam End: 08/14/24 10:31 Last Resulted: 08/19/24 08:27  Received From: [facility]  Result Received: 09/08/24 10:22    -----------------------   X-ray calcaneus right minimum 2 views  Anatomical Region Laterality Modality  Foot Right -- Radiographic Imaging  Toe Right -- --  Ankle Right -- --  ORTHO Foot Right -- --   Narrative  EXAM: X-ray calcaneus right minimum 2 views  INDICATION:  Heel pain, bilateral [M79.671, F20.327 (ICD-10-CM)]   TECHNIQUE AND FINDINGS: Lateral and calcaneal axial of the right calcaneus reveals no signs of acute fracture  or space-occupying lesions.  Subtalar joint is well maintained.  No space-occupying lesions are noted.  No varus or valgus of the calcaneus on the axial view.  Remainder of the calcaneal x-ray is negative.  Small posterior Achilles insertional exostosis is noted    EVA DASIE GAY, DPM Exam End: 08/14/24 10:31 Last Resulted: 08/19/24 08:28  Received From: [facility]  Result Received: 09/08/24 10:22    Results for orders placed or performed in visit on 07/07/24  TSH   Collection Time: 07/08/24  8:29 AM  Result Value Ref Range   TSH 1.95 0.40 - 4.50 mIU/L  Lipid panel   Collection Time: 07/08/24  8:29 AM  Result Value Ref Range   Cholesterol 180 <200 mg/dL   HDL 48 (L) > OR = 50 mg/dL   Triglycerides 86 <849 mg/dL   LDL Cholesterol (Calc) 113 (H) mg/dL (calc)   Total CHOL/HDL Ratio 3.8 <5.0 (calc)   Non-HDL Cholesterol (Calc) 132 (H) <130 mg/dL (calc)  Hemoglobin J8r   Collection Time: 07/08/24  8:29 AM  Result Value Ref Range   Hgb A1c MFr Bld 5.9 (H) <5.7 %   Mean Plasma Glucose 123 mg/dL   eAG (mmol/L) 6.8 mmol/L  CBC with Differential/Platelet   Collection Time: 07/08/24  8:29 AM  Result Value Ref Range   WBC 5.3 3.8 - 10.8 Thousand/uL   RBC 4.22 3.80 - 5.10 Million/uL   Hemoglobin 12.0 11.7 - 15.5 g/dL   HCT 62.2 64.9 - 54.9 %   MCV 89.3 80.0 - 100.0 fL   MCH 28.4 27.0 - 33.0 pg   MCHC 31.8 (L) 32.0 - 36.0 g/dL   RDW 86.9 88.9 - 84.9 %   Platelets 236 140  - 400 Thousand/uL   MPV 9.7 7.5 - 12.5 fL   Neutro Abs 2,523 1,500 - 7,800 cells/uL   Absolute Lymphocytes 2,189 850 - 3,900 cells/uL   Absolute Monocytes 339 200 - 950 cells/uL   Eosinophils Absolute 228 15 - 500 cells/uL   Basophils Absolute 21 0 - 200 cells/uL   Neutrophils Relative % 47.6 %   Total Lymphocyte 41.3 %   Monocytes Relative 6.4 %   Eosinophils Relative 4.3 %   Basophils Relative 0.4 %  Comprehensive metabolic panel with GFR   Collection Time: 07/08/24  8:29 AM  Result Value Ref Range   Glucose, Bld 87 65 - 99 mg/dL   BUN 13 7 - 25 mg/dL   Creat 9.31 9.49 - 8.94 mg/dL   eGFR 98 > OR = 60 fO/fpw/8.26f7   BUN/Creatinine Ratio SEE NOTE: 6 - 22 (calc)   Sodium 140 135 - 146 mmol/L   Potassium 4.3 3.5 - 5.3 mmol/L   Chloride 105 98 - 110 mmol/L   CO2 28 20 - 32 mmol/L   Calcium  9.4 8.6 - 10.4 mg/dL   Total Protein 7.0 6.1 - 8.1 g/dL   Albumin 4.2 3.6 - 5.1 g/dL   Globulin 2.8 1.9 - 3.7 g/dL (calc)   AG Ratio 1.5 1.0 - 2.5 (calc)   Total Bilirubin 0.5 0.2 - 1.2 mg/dL   Alkaline phosphatase (APISO) 55 37 - 153 U/L   AST 15 10 - 35 U/L   ALT 11 6 - 29 U/L  T4, free   Collection Time: 07/08/24  8:29 AM  Result Value Ref Range   Free T4 1.3 0.8 - 1.8 ng/dL      Assessment & Plan:   Problem List Items Addressed This Visit  Acquired hypothyroidism - Primary   Relevant Orders   US  THYROID    CBC with Differential/Platelet   Thyroid  Panel With TSH   Chronic venous insufficiency   Relevant Orders   Ambulatory referral to Vascular Surgery   Intrinsic atopic dermatitis   Relevant Orders   Ambulatory referral to Dermatology   Lymphedema   Relevant Orders   Ambulatory referral to Vascular Surgery   Varicose veins of both lower extremities with pain   Relevant Orders   Ambulatory referral to Vascular Surgery   Vitamin B12 deficiency   Relevant Orders   Vitamin B12   Other Visit Diagnoses       Degeneration of intervertebral disc of lumbar region with  discogenic back pain and lower extremity pain       Relevant Medications   gabapentin  (NEURONTIN ) 300 MG capsule   Other Relevant Orders   Comprehensive metabolic panel with GFR   CBC with Differential/Platelet     Bilateral foot pain       Relevant Medications   gabapentin  (NEURONTIN ) 300 MG capsule     Heel pain, bilateral       Relevant Medications   gabapentin  (NEURONTIN ) 300 MG capsule     Muscle cramps       Relevant Orders   Magnesium      Vitamin D  deficiency       Relevant Orders   VITAMIN D  25 Hydroxy (Vit-D Deficiency, Fractures)        Chronic bilateral foot pain and lumbar disc degeneration Chronic foot pain unresponsive to previous treatments. Differential includes vitamin deficiencies and thyroid  issues or neurological or local podiatry concerns. She has been managing this problem for many years. Prior evaluations include Vascular, Neurology/ Neurosurgery, and Podiatry. She has had EMG normal, awaiting further imaging in future, X-rays done of feet without signficant degenerative problem. She has had vascular work up as well, with varicose veins and some edema/insufficiency. Awaiting further therapy  Today asks about other lab repeat and chemistry to see if vitamin deficiency, in past had not been the case. Based on prior review. She has hypothyroidism but normal labs. Asking about Thyroid  imaging.  Improved from podiatry injection series, only temporary lasting weeks to months, not >3 months however. Now with Dr Ashley Glenn Podiatry  - Ordered blood tests for vitamin levels, magnesium , and thyroid  function. - Increased gabapentin  to 300 mg at bedtime. She was given 100mg  at night by neurology but it was ineffective so she stopped, did not raise dose. - Scheduled blood work for next week.  Chronic venous insufficiency of lower extremities Followed by Vascular, lymphedema / insufficiency varicose veins Worsening symptoms, has had US  imaging and vascular work  up Needs referral to return to [facility] Dr Jama  Acquired hypothyroidism She has been advised by multiple specialists to evaluate thyroid  Potential contribution to foot pain despite previous normal tests. - Ordered thyroid  function tests. And Thyroid  US   Vitamin B12 deficiency Potential contribution to foot pain and muscle cramps despite previous normal levels. - Ordered vitamin B12 level.  Atopic dermatitis Exacerbated in winter, previous referral expired. - Referred to dermatologist for management.  Episode of Elevated BP Without Hypertension Episodes of elevated blood pressure with headache and nausea, temporary relief with lisinopril. - Continue to monitor blood pressure and symptoms. - Will consider further evaluation if symptoms persist.       Orders Placed This Encounter  Procedures   US  THYROID     Standing Status:   Future    Expiration  Date:   12/12/2025    Reason for Exam (SYMPTOM  OR DIAGNOSIS REQUIRED):   history hypothyroidism, normal labs currently but symptoms    Preferred imaging location?:   [facility]   Vitamin B12    Standing Status:   Future    Expected Date:   12/19/2024    Expiration Date:   03/19/2025   VITAMIN D  25 Hydroxy (Vit-D Deficiency, Fractures)    Standing Status:   Future    Expected Date:   12/19/2024    Expiration Date:   03/19/2025   Magnesium     Standing Status:   Future    Expected Date:   12/19/2024    Expiration Date:   03/19/2025   Comprehensive metabolic panel with GFR    Standing Status:   Future    Expected Date:   12/19/2024    Expiration Date:   03/19/2025   CBC with Differential/Platelet    Standing Status:   Future    Expected Date:   12/19/2024    Expiration Date:   03/19/2025   Thyroid  Panel With TSH    Standing Status:   Future    Expected Date:   12/19/2024    Expiration Date:   03/19/2025   Ambulatory referral to Vascular Surgery    Referral Priority:   Routine    Referral Type:   Surgical    Referral Reason:    Specialty Services Required    Requested Specialty:   Vascular Surgery    Number of Visits Requested:   1   Ambulatory referral to Dermatology    Referral Priority:   Routine    Referral Type:   Consultation    Referral Reason:   Specialty Services Required    Requested Specialty:   Dermatology    Number of Visits Requested:   1    Meds ordered this encounter  Medications   gabapentin  (NEURONTIN ) 300 MG capsule    Sig: Take 1 capsule (300 mg total) by mouth at bedtime.    Dispense:  90 capsule    Refill:  0    Follow up plan: No follow-ups on file.  Future labs ordered for 12/19/24   Marsa Officer, DO [facility] 12/12/2024, 11:50 AM     [1]  Social History Tobacco Use   Smoking status: Never   Smokeless tobacco: Never  Vaping Use   Vaping status: Never Used  Substance Use Topics   Alcohol use: No    Alcohol/week: 0.0 standard drinks of alcohol   Drug use: No   "

## 2024-12-12 NOTE — Patient Instructions (Addendum)
 Thank you for coming to the office today.    Please schedule a Follow-up Appointment to: No follow-ups on file.  If you have any other questions or concerns, please feel free to call the office or send a message through MyChart. You may also schedule an earlier appointment if necessary.  Additionally, you may be receiving a survey about your experience at our office within a few days to 1 week by e-mail or mail. We value your feedback.  Marsa Officer, DO [facility], [place]

## 2024-12-19 ENCOUNTER — Other Ambulatory Visit

## 2024-12-19 DIAGNOSIS — E559 Vitamin D deficiency, unspecified: Secondary | ICD-10-CM

## 2024-12-19 DIAGNOSIS — E538 Deficiency of other specified B group vitamins: Secondary | ICD-10-CM

## 2024-12-19 DIAGNOSIS — R252 Cramp and spasm: Secondary | ICD-10-CM

## 2024-12-19 DIAGNOSIS — M51362 Other intervertebral disc degeneration, lumbar region with discogenic back pain and lower extremity pain: Secondary | ICD-10-CM

## 2024-12-19 DIAGNOSIS — E039 Hypothyroidism, unspecified: Secondary | ICD-10-CM

## 2024-12-19 LAB — CBC WITH DIFFERENTIAL/PLATELET
Absolute Lymphocytes: 1745 {cells}/uL (ref 850–3900)
Absolute Monocytes: 340 {cells}/uL (ref 200–950)
Basophils Absolute: 19 {cells}/uL (ref 0–200)
Basophils Relative: 0.3 %
Eosinophils Absolute: 189 {cells}/uL (ref 15–500)
Eosinophils Relative: 3 %
HCT: 38.6 % (ref 35.9–46.0)
Hemoglobin: 12.4 g/dL (ref 11.7–15.5)
MCH: 28.7 pg (ref 27.0–33.0)
MCHC: 32.1 g/dL (ref 31.6–35.4)
MCV: 89.4 fL (ref 81.4–101.7)
MPV: 10 fL (ref 7.5–12.5)
Monocytes Relative: 5.4 %
Neutro Abs: 4007 {cells}/uL (ref 1500–7800)
Neutrophils Relative %: 63.6 %
Platelets: 260 Thousand/uL (ref 140–400)
RBC: 4.32 Million/uL (ref 3.80–5.10)
RDW: 12.5 % (ref 11.0–15.0)
Total Lymphocyte: 27.7 %
WBC: 6.3 Thousand/uL (ref 3.8–10.8)

## 2024-12-19 LAB — VITAMIN B12: Vitamin B-12: 691 pg/mL (ref 200–1100)

## 2024-12-19 LAB — VITAMIN D 25 HYDROXY (VIT D DEFICIENCY, FRACTURES): Vit D, 25-Hydroxy: 62 ng/mL (ref 30–100)

## 2024-12-19 LAB — COMPREHENSIVE METABOLIC PANEL WITH GFR
AG Ratio: 1.4 (calc) (ref 1.0–2.5)
ALT: 13 U/L (ref 6–29)
AST: 16 U/L (ref 10–35)
Albumin: 4.2 g/dL (ref 3.6–5.1)
Alkaline phosphatase (APISO): 58 U/L (ref 37–153)
BUN: 13 mg/dL (ref 7–25)
CO2: 28 mmol/L (ref 20–32)
Calcium: 9 mg/dL (ref 8.6–10.4)
Chloride: 105 mmol/L (ref 98–110)
Creat: 0.58 mg/dL (ref 0.50–1.05)
Globulin: 3 g/dL (ref 1.9–3.7)
Glucose, Bld: 93 mg/dL (ref 65–99)
Potassium: 4.2 mmol/L (ref 3.5–5.3)
Sodium: 141 mmol/L (ref 135–146)
Total Bilirubin: 0.5 mg/dL (ref 0.2–1.2)
Total Protein: 7.2 g/dL (ref 6.1–8.1)
eGFR: 102 mL/min/1.73m2

## 2024-12-19 LAB — THYROID PANEL WITH TSH
Free Thyroxine Index: 2.4 (ref 1.4–3.8)
T3 Uptake: 29 % (ref 22–35)
T4, Total: 8.2 ug/dL (ref 5.1–11.9)
TSH: 1.21 m[IU]/L (ref 0.40–4.50)

## 2024-12-19 LAB — MAGNESIUM: Magnesium: 2.3 mg/dL (ref 1.5–2.5)

## 2024-12-23 ENCOUNTER — Ambulatory Visit: Payer: Self-pay | Admitting: Family Medicine

## 2025-01-01 ENCOUNTER — Ambulatory Visit

## 2025-01-05 ENCOUNTER — Ambulatory Visit

## 2025-02-02 ENCOUNTER — Ambulatory Visit (INDEPENDENT_AMBULATORY_CARE_PROVIDER_SITE_OTHER): Admitting: Vascular Surgery

## 2025-02-02 ENCOUNTER — Encounter (INDEPENDENT_AMBULATORY_CARE_PROVIDER_SITE_OTHER)
